# Patient Record
Sex: Male | Born: 1965
Health system: Southern US, Community
[De-identification: ages and names within clinical notes are randomized; demographics above are authoritative.]

## PROBLEM LIST (undated history)

## (undated) DIAGNOSIS — I10 Essential (primary) hypertension: Secondary | ICD-10-CM

## (undated) DIAGNOSIS — R7303 Prediabetes: Secondary | ICD-10-CM

## (undated) DIAGNOSIS — E785 Hyperlipidemia, unspecified: Secondary | ICD-10-CM

## (undated) DIAGNOSIS — E119 Type 2 diabetes mellitus without complications: Secondary | ICD-10-CM

## (undated) DIAGNOSIS — G4730 Sleep apnea, unspecified: Secondary | ICD-10-CM

## (undated) DIAGNOSIS — R55 Syncope and collapse: Secondary | ICD-10-CM

## (undated) HISTORY — DX: Essential (primary) hypertension: I10

## (undated) HISTORY — DX: Sleep apnea, unspecified: G47.30

## (undated) HISTORY — DX: Prediabetes: R73.03

## (undated) HISTORY — DX: Hyperlipidemia, unspecified: E78.5

## (undated) HISTORY — DX: Syncope and collapse: R55

## (undated) HISTORY — PX: EYE SURGERY: SHX253

## (undated) HISTORY — DX: Type 2 diabetes mellitus without complications: E11.9

---

## 2006-09-01 ENCOUNTER — Ambulatory Visit: Payer: Self-pay | Admitting: Family Medicine

## 2006-09-01 DIAGNOSIS — G4733 Obstructive sleep apnea (adult) (pediatric): Secondary | ICD-10-CM | POA: Insufficient documentation

## 2006-09-01 DIAGNOSIS — I10 Essential (primary) hypertension: Secondary | ICD-10-CM | POA: Insufficient documentation

## 2006-09-01 LAB — CONVERTED CEMR LAB
Blood in Urine, dipstick: NEGATIVE
Ketones, urine, test strip: NEGATIVE
Nitrite: NEGATIVE
Specific Gravity, Urine: 1.01
Urobilinogen, UA: NEGATIVE
WBC Urine, dipstick: NEGATIVE

## 2006-09-08 ENCOUNTER — Ambulatory Visit: Payer: Self-pay | Admitting: Family Medicine

## 2006-09-09 ENCOUNTER — Encounter (INDEPENDENT_AMBULATORY_CARE_PROVIDER_SITE_OTHER): Payer: Self-pay | Admitting: *Deleted

## 2006-09-09 DIAGNOSIS — E119 Type 2 diabetes mellitus without complications: Secondary | ICD-10-CM

## 2006-09-09 DIAGNOSIS — E1165 Type 2 diabetes mellitus with hyperglycemia: Secondary | ICD-10-CM | POA: Insufficient documentation

## 2006-09-09 LAB — CONVERTED CEMR LAB
ALT: 31 units/L (ref 0–53)
Alkaline Phosphatase: 46 units/L (ref 39–117)
BUN: 9 mg/dL (ref 6–23)
Calcium: 8.6 mg/dL (ref 8.4–10.5)
Chloride: 109 meq/L (ref 96–112)
GFR calc Af Amer: 107 mL/min
GFR calc non Af Amer: 88 mL/min
LDL Cholesterol: 127 mg/dL — ABNORMAL HIGH (ref 0–99)
Total CHOL/HDL Ratio: 6.6

## 2006-09-15 ENCOUNTER — Ambulatory Visit: Payer: Self-pay | Admitting: Family Medicine

## 2006-12-10 ENCOUNTER — Ambulatory Visit: Payer: Self-pay | Admitting: Family Medicine

## 2006-12-10 DIAGNOSIS — E7800 Pure hypercholesterolemia, unspecified: Secondary | ICD-10-CM

## 2006-12-14 ENCOUNTER — Ambulatory Visit: Payer: Self-pay | Admitting: Family Medicine

## 2006-12-14 DIAGNOSIS — E8881 Metabolic syndrome: Secondary | ICD-10-CM

## 2006-12-14 LAB — CONVERTED CEMR LAB
Cholesterol: 177 mg/dL (ref 0–200)
GFR calc Af Amer: 107 mL/min
GFR calc non Af Amer: 88 mL/min
Glucose, Bld: 116 mg/dL — ABNORMAL HIGH (ref 70–99)
HDL: 26.6 mg/dL — ABNORMAL LOW (ref >39.0)
LDL Cholesterol: 113 mg/dL — ABNORMAL HIGH (ref 0–99)
Potassium: 3.8 meq/L (ref 3.5–5.1)
Sodium: 142 meq/L (ref 135–145)
Total CHOL/HDL Ratio: 6.7
Triglycerides: 185 mg/dL — ABNORMAL HIGH (ref 0–149)

## 2007-06-09 ENCOUNTER — Ambulatory Visit: Payer: Self-pay | Admitting: Family Medicine

## 2007-06-11 ENCOUNTER — Ambulatory Visit: Payer: Self-pay | Admitting: Family Medicine

## 2007-06-11 DIAGNOSIS — J309 Allergic rhinitis, unspecified: Secondary | ICD-10-CM | POA: Insufficient documentation

## 2007-06-13 LAB — CONVERTED CEMR LAB
BUN: 11 mg/dL (ref 6–23)
Cholesterol: 174 mg/dL (ref 0–200)
GFR calc Af Amer: 95 mL/min
GFR calc non Af Amer: 79 mL/min
LDL Cholesterol: 123 mg/dL — ABNORMAL HIGH (ref 0–99)
Potassium: 3.8 meq/L (ref 3.5–5.1)
Sodium: 138 meq/L (ref 135–145)
Total CHOL/HDL Ratio: 8.6
VLDL: 31 mg/dL (ref 0–40)

## 2007-12-08 ENCOUNTER — Ambulatory Visit: Payer: Self-pay | Admitting: Family Medicine

## 2007-12-09 LAB — CONVERTED CEMR LAB
ALT: 40 units/L (ref 0–53)
Alkaline Phosphatase: 48 units/L (ref 39–117)
Bilirubin, Direct: 0.1 mg/dL (ref 0.0–0.3)
CO2: 30 meq/L (ref 19–32)
Calcium: 8.8 mg/dL (ref 8.4–10.5)
Glucose, Bld: 120 mg/dL — ABNORMAL HIGH (ref 70–99)
HDL: 30.2 mg/dL — ABNORMAL LOW (ref >39.0)
Potassium: 3.8 meq/L (ref 3.5–5.1)
Sodium: 140 meq/L (ref 135–145)
Total Protein: 6.6 g/dL (ref 6.0–8.3)

## 2007-12-15 ENCOUNTER — Ambulatory Visit: Payer: Self-pay | Admitting: Family Medicine

## 2007-12-22 ENCOUNTER — Ambulatory Visit: Payer: Self-pay | Admitting: Family Medicine

## 2007-12-22 ENCOUNTER — Encounter (INDEPENDENT_AMBULATORY_CARE_PROVIDER_SITE_OTHER): Payer: Self-pay | Admitting: *Deleted

## 2008-03-29 ENCOUNTER — Ambulatory Visit: Payer: Self-pay | Admitting: Family Medicine

## 2008-03-30 LAB — CONVERTED CEMR LAB
Albumin: 3.8 g/dL (ref 3.5–5.2)
Cholesterol: 167 mg/dL (ref 0–200)
HDL: 26.4 mg/dL — ABNORMAL LOW (ref >39.0)
LDL Cholesterol: 119 mg/dL — ABNORMAL HIGH (ref 0–99)
Total Protein: 7.2 g/dL (ref 6.0–8.3)
Triglycerides: 106 mg/dL (ref 0–149)
VLDL: 21 mg/dL (ref 0–40)

## 2008-04-07 ENCOUNTER — Ambulatory Visit: Payer: Self-pay | Admitting: Family Medicine

## 2008-07-11 ENCOUNTER — Ambulatory Visit: Payer: Self-pay | Admitting: Family Medicine

## 2008-07-17 ENCOUNTER — Ambulatory Visit: Payer: Self-pay | Admitting: Family Medicine

## 2008-07-17 LAB — CONVERTED CEMR LAB
ALT: 25 units/L (ref 0–53)
AST: 21 units/L (ref 0–37)
BUN: 9 mg/dL (ref 6–23)
Bilirubin, Direct: 0 mg/dL (ref 0.0–0.3)
CO2: 29 meq/L (ref 19–32)
Chloride: 108 meq/L (ref 96–112)
Cholesterol: 172 mg/dL (ref 0–200)
Creatinine, Ser: 1 mg/dL (ref 0.4–1.5)
Hgb A1c MFr Bld: 5.8 % (ref 4.6–6.5)
Total Protein: 6.8 g/dL (ref 6.0–8.3)
Triglycerides: 116 mg/dL (ref 0.0–149.0)

## 2008-10-03 ENCOUNTER — Ambulatory Visit: Payer: Self-pay | Admitting: Family Medicine

## 2008-10-04 LAB — CONVERTED CEMR LAB
Cholesterol: 173 mg/dL (ref 0–200)
Hgb A1c MFr Bld: 5.7 % (ref 4.6–6.5)
LDL Cholesterol: 117 mg/dL — ABNORMAL HIGH (ref 0–99)

## 2008-10-13 ENCOUNTER — Ambulatory Visit: Payer: Self-pay | Admitting: Family Medicine

## 2009-01-03 ENCOUNTER — Ambulatory Visit: Payer: Self-pay | Admitting: Family Medicine

## 2009-01-04 LAB — CONVERTED CEMR LAB
AST: 21 units/L (ref 0–37)
Albumin: 3.9 g/dL (ref 3.5–5.2)
BUN: 10 mg/dL (ref 6–23)
Calcium: 9.1 mg/dL (ref 8.4–10.5)
Cholesterol: 181 mg/dL (ref 0–200)
Creatinine, Ser: 1.1 mg/dL (ref 0.4–1.5)
GFR calc non Af Amer: 78.02 mL/min (ref >60)
Glucose, Bld: 95 mg/dL (ref 70–99)
Potassium: 4.1 meq/L (ref 3.5–5.1)
Total Bilirubin: 1 mg/dL (ref 0.3–1.2)
Triglycerides: 124 mg/dL (ref 0.0–149.0)
VLDL: 24.8 mg/dL (ref 0.0–40.0)

## 2009-01-26 ENCOUNTER — Ambulatory Visit: Payer: Self-pay | Admitting: Family Medicine

## 2010-02-19 NOTE — Assessment & Plan Note (Signed)
Summary: CPX  CYD   Vital Signs:  Patient profile:   45 year old male Height:      71 inches Weight:      339.6 pounds BMI:     47.54 Temp:     97.8 degrees F oral Pulse rate:   80 / minute Pulse rhythm:   regular BP sitting:   130 / 70  (left arm) Cuff size:   large  Vitals Entered By: Benny Lennert CMA Duncan Dull) (January 26, 2009 11:51 AM)  History of Present Illness: The patient is here for annual wellness exam and preventative care.     HTN, BPs well controlled.  Has not been taking HCTZ.  Weight gain with recent stressors.  Father in Law passed away.  DM, well controlled Not checking CBGs  High cholesterol last OV pt refused restarting medicatiton...wished to try weight loss and getting back on track, but has gained weight instead, 12 lb . Cholesterol with worse control.   Has history of benign cyst on right side of scrotum..nml URO eval.  and [place] per pt 3 years ago.   Preventive Screening-Counseling & Management  Alcohol-Tobacco     Alcohol drinks/day: <1     Alcohol Counseling: not indicated; patient does not drink     Smoking Status: never     Tobacco Counseling: not indicated; no tobacco use  Caffeine-Diet-Exercise     Diet Comments: poor     Diet Counseling: to improve diet; diet is suboptimal     Nutrition Referrals: refuses     Does Patient Exercise: no     Exercise Counseling: to improve exercise regimen  Current Medications (verified): 1)  Hydrochlorothiazide 25 Mg  Tabs (Hydrochlorothiazide) .... Take 1 Tablet By Mouth Once A Day 2)  Flax Seed Oil 1000 Mg  Caps (Flaxseed (Linseed)) .... Take 2 Capsules By Mouth Once Daily 3)  Multivitamins   Tabs (Multiple Vitamin) .... Take 1 Tablet By Mouth Once A Day 4)  Astepro 137 Mcg/spray  Soln (Azelastine Hcl) .... 2 Sprays By Mouth Two Times A Day As Needed 5)  Xyzal 5 Mg  Tabs (Levocetirizine Dihydrochloride) .Marland Kitchen.. 1 Tab By Mouth At Bedtime As Needed 6)  Cinnamon 500 Mg Caps (Cinnamon) .... 1,000 Mg. Once  Daily 7)  Simvastatin 20 Mg Tabs (Simvastatin) .Marland Kitchen.. 1 Tab By Mouth Daily  Allergies (verified): No Known Drug Allergies  Past History:  Past medical, surgical, family and social histories (including risk factors) reviewed, and no changes noted (except as noted below).  Past Medical History: Reviewed history from 09/01/2006 and no changes required. Hypertension  Past Surgical History: Reviewed history from 09/01/2006 and no changes required. Denies surgical history 1996 endoscopy: nml  Family History: Reviewed history from 09/01/2006 and no changes required. adopted unclear family history  Social History: Reviewed history from 09/01/2006 and no changes required. Occupation: Secretary/administrator room Married Never Smoked Alcohol use-yes, beer q 2-3 months Drug use-no Regular exercise-no Diet: fast food, junk food, rare fruit and veggies, likes water  Review of Systems General:  Denies fatigue and fever. CV:  Denies chest pain or discomfort. Resp:  Denies shortness of breath. GI:  Denies abdominal pain, bloody stools, and constipation. GU:  Denies dysuria. Derm:  Denies rash. Psych:  Denies anxiety and depression.  Physical Exam  General:  obese appearing male in NAd Ears:  External ear exam shows no significant lesions or deformities.  Otoscopic examination reveals clear canals, tympanic membranes are intact bilaterally without bulging, retraction, inflammation  or discharge. Hearing is grossly normal bilaterally. Nose:  External nasal examination shows no deformity or inflammation. Nasal mucosa are pink and moist without lesions or exudates. Mouth:  Oral mucosa and oropharynx without lesions or exudates.  Teeth in moderate repair. MMM Neck:  no carotid bruit or thyromegaly no cervical or supraclavicular lymphadenopathy  Lungs:  Normal respiratory effort, chest expands symmetrically. Lungs are clear to auscultation, no crackles or wheezes. Heart:  Normal rate and regular  rhythm. S1 and S2 normal without gallop, murmur, click, rub or other extra sounds. Abdomen:  Bowel sounds positive,abdomen soft and non-tender without masses, organomegaly or hernias noted. Genitalia:  Testes bilaterally descended without nodularity, tenderness or masses. No scrotal masses or lesions. No penis lesions or urethral discharge. Pulses:  R and L posterior tibial pulses are full and equal bilaterally  Extremities:  trace left pedal edema and trace right pedal edema.   Skin:  Intact without suspicious lesions or rashes Psych:  Cognition and judgment appear intact. Alert and cooperative with normal attention span and concentration. No apparent delusions, illusions, hallucinations   Impression & Recommendations:  Problem # 1:  Preventive Health Care (ICD-V70.0) Reviewed preventive care protocols, scheduled due services, and updated immunizations. Encouraged exercise, weight loss, healthy eating habits.   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  His updated medication list for this problem includes:    Simvastatin 20 Mg Tabs (Simvastatin) .Marland Kitchen... 1 tab by mouth daily  Labs Reviewed: SGOT: 21 (01/03/2009)   SGPT: 27 (01/03/2009)  Lipid Goals: Chol Goal: 200 (12/14/2006)   HDL Goal: 40 (12/14/2006)   LDL Goal: 100 (12/22/2007)   TG Goal: 150 (12/14/2006)  Prior 10 Yr Risk Heart Disease: 14 % (07/17/2008)   HDL:28.40 (01/03/2009), 30.20 (10/03/2008)  LDL:128 (01/03/2009), 117 (10/03/2008)  Chol:181 (01/03/2009), 173 (10/03/2008)  Trig:124.0 (01/03/2009), 129.0 (10/03/2008)  Problem # 3:  DIABETES MELLITUS, CONTROLLED (ICD-250.00)  Continue current medication.   Labs Reviewed: Creat: 1.1 (01/03/2009)    Reviewed HgBA1c results: 5.8 (01/03/2009)  5.7 (10/03/2008)  Problem # 4:  HYPERTENSION (ICD-401.9) Well controlled.  Continue current medication.  His updated  medication list for this problem includes:    Hydrochlorothiazide 25 Mg Tabs (Hydrochlorothiazide) .Marland Kitchen... Take 1 tablet by mouth once a day  BP today: 130/70 Prior BP: 110/82 (10/13/2008)  Prior 10 Yr Risk Heart Disease: 14 % (07/17/2008)  Labs Reviewed: K+: 4.1 (01/03/2009) Creat: : 1.1 (01/03/2009)   Chol: 181 (01/03/2009)   HDL: 28.40 (01/03/2009)   LDL: 128 (01/03/2009)   TG: 124.0 (01/03/2009)  Complete Medication List: 1)  Hydrochlorothiazide 25 Mg Tabs (Hydrochlorothiazide) .... Take 1 tablet by mouth once a day 2)  Flax Seed Oil 1000 Mg Caps (Flaxseed (linseed)) .... Take 2 capsules by mouth once daily 3)  Multivitamins Tabs (Multiple vitamin) .... Take 1 tablet by mouth once a day 4)  Astepro 137 Mcg/spray Soln (Azelastine hcl) .... 2 sprays by mouth two times a day as needed 5)  Xyzal 5 Mg Tabs (Levocetirizine dihydrochloride) .Marland Kitchen.. 1 tab by mouth at bedtime as needed 6)  Cinnamon 500 Mg Caps (Cinnamon) .... 1,000 mg. once daily 7)  Simvastatin 20 Mg Tabs (Simvastatin) .Marland Kitchen.. 1 tab by mouth daily  Patient Instructions: 1)  Hepatic Panel prior to visit ICD-9:  270.0 2)  Lipid panel prior to visit ICD-9 :  3)  HgBA1c prior to visit  ICD-9: 250.00 4)  Urine Microalbumin prior  to visit ICD-9 :  5)  Please schedule a follow-up appointment in 3 months  DM, chol.  Prescriptions: SIMVASTATIN 20 MG TABS (SIMVASTATIN) 1 tab by mouth daily  #30 x 11   Entered and Authorized by:   Kerby Nora MD   Signed by:   Kerby Nora MD on 01/26/2009   Method used:   Electronically to        [facility]  [address] (retail)       [address]       [place], [place]  [address]       Ph: [phone] or [phone]       Fax: [phone]   RxID:   [id number]     Appended Document: CPX  CYD Correction..patient open to startting low dose simvastain for chol...recheck in 3 months.

## 2013-02-08 DIAGNOSIS — H18839 Recurrent erosion of cornea, unspecified eye: Secondary | ICD-10-CM | POA: Insufficient documentation

## 2014-11-09 ENCOUNTER — Ambulatory Visit (INDEPENDENT_AMBULATORY_CARE_PROVIDER_SITE_OTHER): Payer: 59 | Admitting: Primary Care

## 2014-11-09 ENCOUNTER — Encounter: Payer: Self-pay | Admitting: Primary Care

## 2014-11-09 VITALS — BP 152/102 | HR 91 | Temp 98.1°F | Ht 73.5 in | Wt 363.1 lb

## 2014-11-09 DIAGNOSIS — R202 Paresthesia of skin: Secondary | ICD-10-CM

## 2014-11-09 DIAGNOSIS — R002 Palpitations: Secondary | ICD-10-CM | POA: Diagnosis not present

## 2014-11-09 DIAGNOSIS — I10 Essential (primary) hypertension: Secondary | ICD-10-CM

## 2014-11-09 DIAGNOSIS — G4733 Obstructive sleep apnea (adult) (pediatric): Secondary | ICD-10-CM

## 2014-11-09 DIAGNOSIS — R7303 Prediabetes: Secondary | ICD-10-CM

## 2014-11-09 LAB — BASIC METABOLIC PANEL
BUN: 13 mg/dL (ref 6–23)
CALCIUM: 9.5 mg/dL (ref 8.4–10.5)
CO2: 29 mEq/L (ref 19–32)
CREATININE: 0.95 mg/dL (ref 0.40–1.50)
Chloride: 104 mEq/L (ref 96–112)
GFR: 89.99 mL/min (ref >60.00)
Glucose, Bld: 110 mg/dL — ABNORMAL HIGH (ref 70–99)
Potassium: 4.5 mEq/L (ref 3.5–5.1)
Sodium: 139 mEq/L (ref 135–145)

## 2014-11-09 LAB — HEMOGLOBIN A1C: HEMOGLOBIN A1C: 6.5 % (ref 4.6–6.5)

## 2014-11-09 MED ORDER — LISINOPRIL 10 MG PO TABS: 10.0000 mg | ORAL_TABLET | Freq: Every day | ORAL | Status: DC

## 2014-11-09 NOTE — Assessment & Plan Note (Signed)
Endorses history of, no follow up in 4+ years. Numbness/tingling to fingers for past week. Poor diet, obese, no exercise, sedentary. Will obtain A1C today given history and symptoms. Initiating on ACE for hypertension today.

## 2014-11-09 NOTE — Assessment & Plan Note (Signed)
Diagnosed 15+ years ago and uses CPAP at home.

## 2014-11-09 NOTE — Assessment & Plan Note (Signed)
Poor diet, sedentary lifestyle. Discussed the importance of improving his diet by cutting out sodas, sweet tea, fried foods.

## 2014-11-09 NOTE — Patient Instructions (Addendum)
Your ECG (picture of your heat) does not show any abnormality.  Start Lisinopril 10 mg tablets for high blood pressure. Take 1 tablet by mouth daily.  Check your blood pressure daily, around the same time of day, for the next 2 weeks.   Ensure that you have rested for 30 minutes prior to checking your blood pressure. Record your readings and bring them to your next visit.  Complete lab work prior to leaving today. I will notify you of your results.  Follow up in 2 weeks for re-evaluation of blood pressure and headaches.  It was a pleasure to meet you today! Please don't hesitate to call me with any questions. Welcome to [facility]!

## 2014-11-09 NOTE — Assessment & Plan Note (Signed)
Endorses history of elevated readings in the past, doesn't ever remember being medicated. Elevated readings at home of 150-160/100's with headaches, dizziness, chest discomfort. Elevated in clinic today. Start Lisinopril 10 mg tablets daily.  BMP today.

## 2014-11-09 NOTE — Progress Notes (Signed)
Subjective:    Patient ID: Antonio Horton, male    DOB: [birth year], 49 y.o.   MRN: [id number]  HPI  Mr. States is a 49 year old male who presents today to establish care and discuss the problems mentioned below. Will review old records.  1) Essential Hypertension: History of elevated blood pressure readings for years, doesn't remember ever being on medications in the past. Over the past 1-2 weeks he's experienced headaches, fatigue, numbness/tingling to fingers, intermittent dizziness. He's checked his BP at home and has been getting readings of 150-160/100's on average. His BP is elevated today in the clinic at 152/102. He's also reported palpitations and chest discomfort to his left chest intermittently for 1 week. Denies nausea, radiation of pain.  2) Borderline Diabetes: History of in the past. He has not followed up with this in over 4 years. He's had some intermittent numbness/tingling to his fingers for the past week. He endorses a poor diet and has been obese for numerous years.  His diet currently consists of: Breakfast: [employer], crackers Lunch: Fast food Dinner: Meat, veggies, occasionally fries foods, bread, potatoes, rice. Snack: Trail mix, crackers Beverages: [employer], sweet water. Limited water consumption  Exercise: Does not routinely exercise.  3) OSA: Diagnosed 15+ years ago. He wears a CPAP at home.   Review of Systems  Constitutional: Negative for unexpected weight change.  HENT: Negative for rhinorrhea.   Respiratory: Negative for cough and shortness of breath.   Cardiovascular: Negative for chest pain.  Gastrointestinal: Negative for diarrhea and constipation.  Genitourinary: Negative for difficulty urinating.  Musculoskeletal: Negative for myalgias and arthralgias.  Skin: Negative for rash.  Allergic/Immunologic: Positive for environmental allergies.  Neurological: Positive for dizziness, numbness and headaches.  Psychiatric/Behavioral:   Denies concerns for anxiety and depression       Past Medical History  Diagnosis Date  . Hypertension   . Borderline diabetes     Social History   Social History  . Marital Status: Married    Spouse Name: N/A  . Number of Children: N/A  . Years of Education: N/A   Occupational History  . Not on file.   Social History Main Topics  . Smoking status: Never Smoker   . Smokeless tobacco: Not on file  . Alcohol Use: 0.0 oz/week    0 Standard drinks or equivalent per week     Comment: socially  . Drug Use: Not on file  . Sexual Activity: Not on file   Other Topics Concern  . Not on file   Social History Narrative   Married.   2 children.   Works as a Administrator, sports.   Enjoys reading.     Past Surgical History  Procedure Laterality Date  . Eye surgery      Corneal dystrophies    Family History  Problem Relation Age of Onset  . Other      adopted    No Known Allergies  No current outpatient prescriptions on file prior to visit.   No current facility-administered medications on file prior to visit.    BP 152/102 mmHg  Pulse 91  Temp(Src) 98.1 F (36.7 C) (Oral)  Ht 6' 1.5" (1.867 m)  Wt 363 lb 1.9 oz (164.71 kg)  BMI 47.25 kg/m2  SpO2 94%    Objective:   Physical Exam  Constitutional: He is oriented to person, place, and time. He appears well-nourished.  HENT:  Head: Normocephalic.  Neck: Neck supple.  Cardiovascular: Normal rate and  regular rhythm.   Pulmonary/Chest: Effort normal and breath sounds normal.  Neurological: He is alert and oriented to person, place, and time.  Skin: Skin is warm and dry.  Psychiatric: He has a normal mood and affect.          Assessment & Plan:  ECG: NSR rate of 74. No ST elevation or depression, PAC's, PVC's. T-wave inversion to V2, will continue to monitor this. Overall unremarkable ECG.

## 2014-11-10 ENCOUNTER — Other Ambulatory Visit: Payer: Self-pay | Admitting: Primary Care

## 2014-11-10 DIAGNOSIS — E119 Type 2 diabetes mellitus without complications: Secondary | ICD-10-CM

## 2014-11-10 MED ORDER — METFORMIN HCL 500 MG PO TABS: 500.0000 mg | ORAL_TABLET | Freq: Two times a day (BID) | ORAL | Status: DC

## 2014-11-23 ENCOUNTER — Ambulatory Visit (INDEPENDENT_AMBULATORY_CARE_PROVIDER_SITE_OTHER): Payer: 59 | Admitting: Primary Care

## 2014-11-23 ENCOUNTER — Encounter: Payer: Self-pay | Admitting: Primary Care

## 2014-11-23 VITALS — BP 118/76 | HR 76 | Temp 97.6°F | Ht 76.0 in | Wt 361.0 lb

## 2014-11-23 DIAGNOSIS — I10 Essential (primary) hypertension: Secondary | ICD-10-CM

## 2014-11-23 DIAGNOSIS — E119 Type 2 diabetes mellitus without complications: Secondary | ICD-10-CM | POA: Diagnosis not present

## 2014-11-23 NOTE — Progress Notes (Signed)
   Subjective:    Patient ID: Antonio Horton, male    DOB: April 05, 1965, 49 y.o.   MRN: [id number]  HPI  Antonio Horton is a 49 year old male who presents today for follow up of hypertension. He was evaluated 2 weeks ago and noted to have high blood pressure. He had elevated readings at home of 150-160/100's with symptoms of headache, dizziness, chest discomfort. He was initiated on Lisinopril 10 mg last visit.  Since his last visit his blood pressure is stable. He's been checking his BP at home and has been getting similar readings. Overall he's feeling improved with symptoms. Denies headaches, dizziness, chest discomfort, cough.   BP Readings from Last 3 Encounters:  11/23/14 118/76  11/09/14 152/102  01/26/09 130/70     Review of Systems  Respiratory: Negative for shortness of breath.   Cardiovascular: Negative for chest pain.  Neurological: Negative for dizziness, numbness and headaches.       Past Medical History  Diagnosis Date  . Hypertension   . Borderline diabetes     Social History   Social History  . Marital Status: Married    Spouse Name: N/A  . Number of Children: N/A  . Years of Education: N/A   Occupational History  . Not on file.   Social History Main Topics  . Smoking status: Never Smoker   . Smokeless tobacco: Not on file  . Alcohol Use: 0.0 oz/week    0 Standard drinks or equivalent per week     Comment: socially  . Drug Use: Not on file  . Sexual Activity: Not on file   Other Topics Concern  . Not on file   Social History Narrative   Married.   2 children.   Works as a Administrator, sports.   Enjoys reading.     Past Surgical History  Procedure Laterality Date  . Eye surgery      Corneal dystrophies    Family History  Problem Relation Age of Onset  . Other      adopted    No Known Allergies  Current Outpatient Prescriptions on File Prior to Visit  Medication Sig Dispense Refill  . lisinopril (PRINIVIL,ZESTRIL) 10 MG tablet  Take 1 tablet (10 mg total) by mouth daily. 90 tablet 0  . metFORMIN (GLUCOPHAGE) 500 MG tablet Take 1 tablet (500 mg total) by mouth 2 (two) times daily with a meal. 60 tablet 3   No current facility-administered medications on file prior to visit.    BP 118/76 mmHg  Pulse 76  Temp(Src) 97.6 F (36.4 C) (Oral)  Ht 6\' 4"  (1.93 m)  Wt 361 lb (163.749 kg)  BMI 43.96 kg/m2  SpO2 97%    Objective:   Physical Exam  Constitutional: He appears well-nourished.  Cardiovascular: Normal rate and regular rhythm.   Pulmonary/Chest: Effort normal and breath sounds normal.  Skin: Skin is warm and dry.          Assessment & Plan:

## 2014-11-23 NOTE — Patient Instructions (Signed)
Please schedule a physical with me in 3 months. You will also schedule a lab only appointment one week prior which will need to be after January 20th. We will discuss your lab results during your physical.  Continue taking Lisinopril for high blood pressure.   Don't hesitate to call me if you have any additional questions.  It was a pleasure to see you today!  Hypertension Hypertension, commonly called high blood pressure, is when the force of blood pumping through your arteries is too strong. Your arteries are the blood vessels that carry blood from your heart throughout your body. A blood pressure reading consists of a higher number over a lower number, such as 110/72. The higher number (systolic) is the pressure inside your arteries when your heart pumps. The lower number (diastolic) is the pressure inside your arteries when your heart relaxes. Ideally you want your blood pressure below 120/80. Hypertension forces your heart to work harder to pump blood. Your arteries may become narrow or stiff. Having untreated or uncontrolled hypertension can cause heart attack, stroke, kidney disease, and other problems. RISK FACTORS Some risk factors for high blood pressure are controllable. Others are not.  Risk factors you cannot control include:   Race. You may be at higher risk if you are African American.  Age. Risk increases with age.  Gender. Men are at higher risk than women before age 73 years. After age 2, women are at higher risk than men. Risk factors you can control include:  Not getting enough exercise or physical activity.  Being overweight.  Getting too much fat, sugar, calories, or salt in your diet.  Drinking too much alcohol. SIGNS AND SYMPTOMS Hypertension does not usually cause signs or symptoms. Extremely high blood pressure (hypertensive crisis) may cause headache, anxiety, shortness of breath, and nosebleed. DIAGNOSIS To check if you have hypertension, your health care  provider will measure your blood pressure while you are seated, with your arm held at the level of your heart. It should be measured at least twice using the same arm. Certain conditions can cause a difference in blood pressure between your right and left arms. A blood pressure reading that is higher than normal on one occasion does not mean that you need treatment. If it is not clear whether you have high blood pressure, you may be asked to return on a different day to have your blood pressure checked again. Or, you may be asked to monitor your blood pressure at home for 1 or more weeks. TREATMENT Treating high blood pressure includes making lifestyle changes and possibly taking medicine. Living a healthy lifestyle can help lower high blood pressure. You may need to change some of your habits. Lifestyle changes may include:  Following the DASH diet. This diet is high in fruits, vegetables, and whole grains. It is low in salt, red meat, and added sugars.  Keep your sodium intake below 2,300 mg per day.  Getting at least 30-45 minutes of aerobic exercise at least 4 times per week.  Losing weight if necessary.  Not smoking.  Limiting alcoholic beverages.  Learning ways to reduce stress. Your health care provider may prescribe medicine if lifestyle changes are not enough to get your blood pressure under control, and if one of the following is true:  You are 55-78 years of age and your systolic blood pressure is above 140.  You are 25 years of age or older, and your systolic blood pressure is above 150.  Your diastolic blood  pressure is above 90.  You have diabetes, and your systolic blood pressure is over 967 or your diastolic blood pressure is over 90.  You have kidney disease and your blood pressure is above 140/90.  You have heart disease and your blood pressure is above 140/90. Your personal target blood pressure may vary depending on your medical conditions, your age, and other  factors. HOME CARE INSTRUCTIONS  Have your blood pressure rechecked as directed by your health care provider.   Take medicines only as directed by your health care provider. Follow the directions carefully. Blood pressure medicines must be taken as prescribed. The medicine does not work as well when you skip doses. Skipping doses also puts you at risk for problems.  Do not smoke.   Monitor your blood pressure at home as directed by your health care provider. SEEK MEDICAL CARE IF:   You think you are having a reaction to medicines taken.  You have recurrent headaches or feel dizzy.  You have swelling in your ankles.  You have trouble with your vision. SEEK IMMEDIATE MEDICAL CARE IF:  You develop a severe headache or confusion.  You have unusual weakness, numbness, or feel faint.  You have severe chest or abdominal pain.  You vomit repeatedly.  You have trouble breathing. MAKE SURE YOU:   Understand these instructions.  Will watch your condition.  Will get help right away if you are not doing well or get worse.   This information is not intended to replace advice given to you by your health care provider. Make sure you discuss any questions you have with your health care provider.   Document Released: 01/06/2005 Document Revised: 05/23/2014 Document Reviewed: 10/29/2012 Elsevier Interactive Patient Education [employer].

## 2014-11-23 NOTE — Progress Notes (Signed)
Pre visit review using our clinic review tool, if applicable. No additional management support is needed unless otherwise documented below in the visit note. 

## 2014-11-23 NOTE — Assessment & Plan Note (Signed)
A1C of 6.5 in October 2016. Metformin 500 mg BID initiated. Discussed importance of healthy diet and exercise. Repeat A1C in 3 months at physical.

## 2014-11-23 NOTE — Assessment & Plan Note (Signed)
Improved since initiation of Lisinopril 10 mg. Continue current regimen. Discussed importance of healthy diet and exercise. BMP last visit WNL. Follow up in 3 months.

## 2015-02-16 ENCOUNTER — Other Ambulatory Visit: Payer: Self-pay | Admitting: Primary Care

## 2015-02-16 DIAGNOSIS — I10 Essential (primary) hypertension: Secondary | ICD-10-CM

## 2015-02-16 NOTE — Telephone Encounter (Signed)
Electronically refill request for   lisinopril (PRINIVIL,ZESTRIL) 10 MG tablet   Take 1 tablet (10 mg total) by mouth daily.  Dispense: 90 tablet   Refills: 0     Last prescribed on 11/09/2014. Last seen on 11/23/2014. CPE on 03/07/2015.

## 2015-02-22 ENCOUNTER — Other Ambulatory Visit: Payer: Self-pay | Admitting: Primary Care

## 2015-02-22 DIAGNOSIS — E119 Type 2 diabetes mellitus without complications: Secondary | ICD-10-CM

## 2015-02-22 DIAGNOSIS — I10 Essential (primary) hypertension: Secondary | ICD-10-CM

## 2015-03-02 ENCOUNTER — Other Ambulatory Visit (INDEPENDENT_AMBULATORY_CARE_PROVIDER_SITE_OTHER): Payer: 59

## 2015-03-02 DIAGNOSIS — I10 Essential (primary) hypertension: Secondary | ICD-10-CM | POA: Diagnosis not present

## 2015-03-02 DIAGNOSIS — E119 Type 2 diabetes mellitus without complications: Secondary | ICD-10-CM

## 2015-03-02 LAB — MICROALBUMIN / CREATININE URINE RATIO
Creatinine,U: 141.3 mg/dL
MICROALB/CREAT RATIO: 0 mg/g (ref 0.0–30.0)
Microalb, Ur: <0.1 mg/dL (ref 0.0–1.9)

## 2015-03-02 LAB — COMPREHENSIVE METABOLIC PANEL
ALT: 19 U/L (ref 0–53)
AST: 17 U/L (ref 0–37)
Albumin: 3.9 g/dL (ref 3.5–5.2)
Alkaline Phosphatase: 55 U/L (ref 39–117)
BUN: 11 mg/dL (ref 6–23)
CALCIUM: 9.4 mg/dL (ref 8.4–10.5)
CHLORIDE: 104 meq/L (ref 96–112)
CO2: 31 meq/L (ref 19–32)
CREATININE: 1.05 mg/dL (ref 0.40–1.50)
GFR: 80.07 mL/min (ref >60.00)
GLUCOSE: 137 mg/dL — AB (ref 70–99)
Potassium: 4.6 mEq/L (ref 3.5–5.1)
SODIUM: 139 meq/L (ref 135–145)
Total Bilirubin: 0.6 mg/dL (ref 0.2–1.2)
Total Protein: 7.2 g/dL (ref 6.0–8.3)

## 2015-03-02 LAB — HEMOGLOBIN A1C: Hgb A1c MFr Bld: 6.6 % — ABNORMAL HIGH (ref 4.6–6.5)

## 2015-03-02 LAB — LIPID PANEL
CHOL/HDL RATIO: 6
Cholesterol: 180 mg/dL (ref 0–200)
HDL: 31.4 mg/dL — AB (ref >39.00)
LDL Cholesterol: 125 mg/dL — ABNORMAL HIGH (ref 0–99)
NonHDL: 148.16
TRIGLYCERIDES: 118 mg/dL (ref 0.0–149.0)
VLDL: 23.6 mg/dL (ref 0.0–40.0)

## 2015-03-07 ENCOUNTER — Telehealth: Payer: Self-pay | Admitting: Primary Care

## 2015-03-07 ENCOUNTER — Encounter: Payer: Self-pay | Admitting: Primary Care

## 2015-03-07 ENCOUNTER — Ambulatory Visit
Admission: RE | Admit: 2015-03-07 | Discharge: 2015-03-07 | Disposition: A | Discharge status: Home / Self Care | Payer: 59 | Source: Ambulatory Visit | Attending: Internal Medicine | Admitting: Internal Medicine

## 2015-03-07 ENCOUNTER — Ambulatory Visit (INDEPENDENT_AMBULATORY_CARE_PROVIDER_SITE_OTHER): Payer: 59 | Admitting: Primary Care

## 2015-03-07 ENCOUNTER — Ambulatory Visit
Admission: RE | Admit: 2015-03-07 | Discharge: 2015-03-07 | Disposition: A | Discharge status: Home / Self Care | Payer: 59 | Source: Ambulatory Visit | Attending: Primary Care | Admitting: Primary Care

## 2015-03-07 VITALS — BP 132/90 | HR 77 | Temp 98.0°F | Ht 73.0 in | Wt 359.5 lb

## 2015-03-07 DIAGNOSIS — M5442 Lumbago with sciatica, left side: Secondary | ICD-10-CM | POA: Insufficient documentation

## 2015-03-07 DIAGNOSIS — M5441 Lumbago with sciatica, right side: Secondary | ICD-10-CM

## 2015-03-07 DIAGNOSIS — M545 Low back pain, unspecified: Secondary | ICD-10-CM | POA: Insufficient documentation

## 2015-03-07 DIAGNOSIS — M5136 Other intervertebral disc degeneration, lumbar region: Secondary | ICD-10-CM | POA: Diagnosis not present

## 2015-03-07 DIAGNOSIS — Z0001 Encounter for general adult medical examination with abnormal findings: Secondary | ICD-10-CM

## 2015-03-07 DIAGNOSIS — Z0000 Encounter for general adult medical examination without abnormal findings: Secondary | ICD-10-CM | POA: Insufficient documentation

## 2015-03-07 DIAGNOSIS — E119 Type 2 diabetes mellitus without complications: Secondary | ICD-10-CM

## 2015-03-07 DIAGNOSIS — E7800 Pure hypercholesterolemia, unspecified: Secondary | ICD-10-CM

## 2015-03-07 DIAGNOSIS — I10 Essential (primary) hypertension: Secondary | ICD-10-CM | POA: Diagnosis not present

## 2015-03-07 NOTE — Telephone Encounter (Signed)
Will you please call Antonio Horton and notify him that prior to his appointment with neurosurgery, he will need to complete xrays of his back in our clinic. I have placed the orders for xray, he should try to come in sometime this week if possible. Open late on Wednesday and Thursday.

## 2015-03-07 NOTE — Assessment & Plan Note (Signed)
Slightly above goal today. Home readings of 130/70's per patient. He does feel nervous today. Will continue to monitor.

## 2015-03-07 NOTE — Assessment & Plan Note (Signed)
Tdap UTD. Declines flu and pneumonia. Exam unremarkable. Labs with diabetes and hyperlipidemia. Discussed the importance of a healthy diet and regular exercise in order for weight loss and to reduce risk of other medical diseases.  Follow up in 1 year for repeat physical.

## 2015-03-07 NOTE — Progress Notes (Signed)
Pre visit review using our clinic review tool, if applicable. No additional management support is needed unless otherwise documented below in the visit note. 

## 2015-03-07 NOTE — Telephone Encounter (Addendum)
Called patient and he stated that he already done his x-rays this morning at [facility].

## 2015-03-07 NOTE — Patient Instructions (Addendum)
Start taking your Metformin twice daily everyday. Your diabetes is getting worse, so it's crucial to take this medication as prescribed.  It is important that you improve your diet. Please limit carbohydrates in the form of white bread, rice, pasta, fast food, soft drinks, etc. Increase your consumption of fresh fruits and vegetables.  You need to consume about 2 liters of water daily.  Start exercising. You should be getting 1 hour of moderate intensity exercise 5 days weekly.  Check your blood pressure daily, around the same time of day, for the next 2 weeks.   Ensure that you have rested for 30 minutes prior to checking your blood pressure. Record your readings and call me in 2 weeks with the numbers.  Stop by the front desk and speak with either Rosaria Ferries or Ebony Hail regarding your referral to Neurology.  Schedule a lab only appointment in 3 months for re-check of the diabetes.  Follow up in 6 months for re-evaluation.  It was a pleasure to see you today!

## 2015-03-07 NOTE — Assessment & Plan Note (Signed)
Slightly above goal with LDL of 125. Will allow him to work on is diet and start exercising. Re-check in 3 months, if no improvement then will consider statin.

## 2015-03-07 NOTE — Assessment & Plan Note (Addendum)
A1C of 6.6, worse from last visit. He's not taking his Metformin daily, will take 3 times weekly once daily if he remembers. Stressed the importance of medication compliance.  Declines pneumonia vaccination today. Urine microalbumin negative. Managed on ACE for HTN. Discussed the importance of a healthy diet and regular exercise in order for weight loss and to reduce risk of other medical diseases.  Will repeat A1C in 3 months.

## 2015-03-07 NOTE — Assessment & Plan Note (Signed)
Present for years since falling off deck. Pain worse and now with movement to thoracic spine. He would like evaluation by neurosurgery and has already called.  DG lumbar spine today with degenerative changes, otherwise no abnormality.  Referral placed per patient request. Also discussed that weight loss was important to improve back pain.

## 2015-03-07 NOTE — Progress Notes (Signed)
Subjective:    Patient ID: Antonio Horton, male    DOB: 1965/11/27, 50 y.o.   MRN: [id number]  HPI  Antonio Horton is a 50 year old male who presents today for complete physical.  Immunizations: -Tetanus: Completed in February 2010 -Influenza: Declines -Pneumonia: Declines   Diet: He endorses a healthy diet. Breakfast: Bagel, crackers Lunch: Fast food, trying to make better choices out. Dinner: Home cooked meals (chicken, vegetables, tacos) Snacks: None Desserts: None Beverages: Mountain Dew (1 20 ounce bottle daily), sweet tea, water  Exercise: He is not currently exercising Eye exam: Completed 1 week ago. New prescription. Dental exam: Completed 1 year ago  1) Back Pain: Chronic for 2 years since accidentally falling off of his deck 2 years ago. Located to his lower back and is now radiating to upper back. He's called a neurosurgery office who requires a referral for evaluation. His pain is worse with movement after resting for a while, especially in the morning. Denies radiculopathy. He's not had any imaging completed. He is obese.   Review of Systems  Constitutional: Negative for unexpected weight change.  HENT: Negative for rhinorrhea.   Respiratory: Negative for cough and shortness of breath.   Cardiovascular: Negative for chest pain.  Gastrointestinal: Negative for diarrhea and constipation.  Genitourinary: Negative for difficulty urinating.  Musculoskeletal: Negative for myalgias and arthralgias.  Skin: Negative for rash.  Allergic/Immunologic: Positive for environmental allergies.  Neurological: Negative for dizziness, numbness and headaches.  Psychiatric/Behavioral:       Denies concerns for anxiety or depression       Past Medical History  Diagnosis Date  . Hypertension   . Borderline diabetes     Social History   Social History  . Marital Status: Married    Spouse Name: N/A  . Number of Children: N/A  . Years of Education: N/A   Occupational  History  . Not on file.   Social History Main Topics  . Smoking status: Never Smoker   . Smokeless tobacco: Not on file  . Alcohol Use: 0.0 oz/week    0 Standard drinks or equivalent per week     Comment: socially  . Drug Use: No  . Sexual Activity: Not on file   Other Topics Concern  . Not on file   Social History Narrative   Married.   2 children.   Works as a Administrator, sports.   Enjoys reading.     Past Surgical History  Procedure Laterality Date  . Eye surgery      Corneal dystrophies    Family History  Problem Relation Age of Onset  . Other      adopted    No Known Allergies  Current Outpatient Prescriptions on File Prior to Visit  Medication Sig Dispense Refill  . lisinopril (PRINIVIL,ZESTRIL) 10 MG tablet TAKE 1 TABLET (10 MG TOTAL) BY MOUTH DAILY. 90 tablet 2  . metFORMIN (GLUCOPHAGE) 500 MG tablet Take 1 tablet (500 mg total) by mouth 2 (two) times daily with a meal. 60 tablet 3   No current facility-administered medications on file prior to visit.    BP 132/90 mmHg  Pulse 77  Temp(Src) 98 F (36.7 C) (Oral)  Ht 6\' 1"  (1.854 m)  Wt 359 lb 8 oz (163.068 kg)  BMI 47.44 kg/m2  SpO2 96%    Objective:   Physical Exam  Constitutional: He is oriented to person, place, and time. He appears well-nourished.  HENT:  Right Ear: Tympanic membrane and ear  canal normal.  Left Ear: Tympanic membrane and ear canal normal.  Nose: Nose normal. Right sinus exhibits no maxillary sinus tenderness and no frontal sinus tenderness. Left sinus exhibits no maxillary sinus tenderness and no frontal sinus tenderness.  Mouth/Throat: Oropharynx is clear and moist.  Eyes: Conjunctivae and EOM are normal. Pupils are equal, round, and reactive to light.  Neck: Neck supple. No thyromegaly present.  Cardiovascular: Normal rate, regular rhythm and normal heart sounds.   Pulmonary/Chest: Effort normal and breath sounds normal. He has no wheezes. He has no rales.  Abdominal:  Soft. Bowel sounds are normal. There is no tenderness.  Musculoskeletal: Normal range of motion.  Negative straight leg raise.   Neurological: He is alert and oriented to person, place, and time. He has normal reflexes. No cranial nerve deficit.  Skin: Skin is warm and dry.  Psychiatric: He has a normal mood and affect.          Assessment & Plan:

## 2015-03-21 ENCOUNTER — Telehealth: Payer: Self-pay | Admitting: Primary Care

## 2015-03-21 NOTE — Telephone Encounter (Signed)
Tried to call patient this morning and afternoon. Could not leave message due to no voicemail set up.

## 2015-03-21 NOTE — Telephone Encounter (Signed)
Will you check on Antonio Horton BP? It was slightly elevated during his physical.

## 2015-04-05 ENCOUNTER — Ambulatory Visit: Payer: 59

## 2015-04-10 ENCOUNTER — Ambulatory Visit: Payer: 59 | Attending: Neurosurgery | Admitting: Physical Therapy

## 2015-04-10 DIAGNOSIS — R262 Difficulty in walking, not elsewhere classified: Secondary | ICD-10-CM

## 2015-04-10 DIAGNOSIS — M545 Low back pain: Secondary | ICD-10-CM | POA: Insufficient documentation

## 2015-04-10 DIAGNOSIS — M5441 Lumbago with sciatica, right side: Secondary | ICD-10-CM

## 2015-04-10 DIAGNOSIS — M5430 Sciatica, unspecified side: Secondary | ICD-10-CM | POA: Diagnosis present

## 2015-04-10 DIAGNOSIS — M5440 Lumbago with sciatica, unspecified side: Secondary | ICD-10-CM

## 2015-04-10 NOTE — Therapy (Signed)
[facility] [address]  [place], [place], [address] Phone: [phone]   Fax:  [phone]  Physical Therapy Evaluation  Patient Details  Name: Antonio Horton MRN: [id number] Date of Birth: 12-09-1965 Referring Provider: Ophelia Charter, MD  Encounter Date: 04/10/2015      PT End of Session - 04/10/15 1447    Visit Number 1   Number of Visits 16   Date for PT Re-Evaluation 06/05/15   PT Start Time 1400   PT Stop Time 1446   PT Time Calculation (min) 46 min   Activity Tolerance Patient tolerated treatment well;Patient limited by pain   Behavior During Therapy [facility] for tasks assessed/performed      Past Medical History  Diagnosis Date  . Hypertension   . Borderline diabetes     Past Surgical History  Procedure Laterality Date  . Eye surgery      Corneal dystrophies    There were no vitals filed for this visit.  Visit Diagnosis:  Right-sided low back pain with sciatica, sciatica laterality unspecified  Low back pain of thoracolumbar region with sciatica  Difficulty walking      Subjective Assessment - 04/10/15 1410    Subjective Pt reports h/o low back pain x ~2 years, starting when pt fell off his back deck. States pain has progressively gotten worse and now seems to be spreading up his back to just inferior to shoulder blades and wrapping around ribs, as well as down the sides of his legs to just above the knees but pain worst in hips.   Limitations Standing;Sitting;Walking   How long can you sit comfortably? 45 minutes   How long can you stand comfortably? 20 minutes   How long can you walk comfortably? 20-30 minutes before pain in leg causes limp   Diagnostic tests Lumbar spine x-ray 03/07/15: Degenerative spurring anteriorly throughout the lumbar spine. Degenerative facet disease diffusely. Normal alignment. No fracture. Disc spaces are maintained. SI joints are symmetric and  unremarkable.   Patient Stated Goals "To move around with less pain and pick stuff up off the ground."   Currently in Pain? Yes   Pain Score 4   Least 2/10, Avg 5/10, Worst 9/10   Pain Location Back   Pain Orientation Mid;Lower;Right   Pain Descriptors / Indicators Throbbing;Sharp   Pain Radiating Towards "tearing pain" up back to just inferior to shoulder blades and wrapping around ribs, as well as down the sides of his legs to just above the knees but pain worst in hips   Pain Onset More than a month ago   Pain Frequency Constant  varies in intensity   Aggravating Factors  Changing position from supine to sitting and in reverse, Prolonged standing, Twisting & reaching, Bending   Pain Relieving Factors Ibuprofen, Lying down flat, Walking   Effect of Pain on Daily Activities Avoids bending (unable to pick up objects from low shelf of floor)            [facility] PT Assessment - 04/10/15 1400    Assessment   Medical Diagnosis Chronic low back pain   Referring Provider Ophelia Charter, MD   Onset Date/Surgical Date --  2 years   Next MD Visit TBD   Prior Therapy none   Balance Screen   Has the patient fallen in the past 6 months Yes   How many times? 2   Has the patient had a decrease in activity level because  of a fear of falling?  No   Is the patient reluctant to leave their home because of a fear of falling?  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   Leisure Softball with dtr; Competetive shooting   Observation/Other Assessments   Focus on Therapeutic Outcomes (FOTO)  Lumbar spine - 45% (55% limitation): Predicted 60% (40% limitation)   ROM / Strength   AROM / PROM / Strength AROM   AROM   AROM Assessment Site Lumbar   Lumbar Flexion 80%  pain upon return to standing   Lumbar Extension 30%  pain into movement    Lumbar - Right Side Bend 10%  pain   Lumbar - Left Side Bend 40%  pain   Lumbar - Right Rotation 25%  pain   Lumbar - Left Rotation 50%  pain   Flexibility   Soft Tissue Assessment /Muscle Length yes   Hamstrings mod tightness on L, unable to assess R due severe LBP with passive SLR   Piriformis mildly tight bilaterally   Special Tests    Special Tests Lumbar   Lumbar Tests Straight Leg Raise;FABER test   FABER test   findings Negative   Straight Leg Raise   Findings Positive   Side  Right         Today's Treatment  TherEx SKTC stretch with towel 2x30" LTR 10x5" Abdominal bracing 10x5" TrA + Alternating Hip ABD/ER 10x3" TrA + LE marching 10x3"           PT Education - 04/10/15 1641    Education provided Yes   Education Details PT eval findings, POC, back precautions with in/out of bed and initial HEP   Person(s) Educated Patient   Methods Explanation;Demonstration;Handout   Comprehension Verbalized understanding;Returned demonstration;Need further instruction          PT Short Term Goals - 04/10/15 1645    PT SHORT TERM GOAL #1   Title Pt will be independent with initial HEP by 05/01/15   Status New           PT Long Term Goals - 04/10/15 1646    PT LONG TERM GOAL #1   Title Pt will be independent with latest HEP by 06/05/15   Status New   PT LONG TERM GOAL #2   Title Pt will demonstrate lumbar ROM at least 75% of normal by 06/05/15   Status New   PT LONG TERM GOAL #3   Title Pt will demonstrate proper body mechanics with bending and lifting from low heights to prevent exacerbation of low back pain by 06/05/15   Status New   PT LONG TERM GOAL #4   Title Pt will report ability to walk >30 minutes without limitation due to low back or LE pain by 06/05/15   Status New               Plan - 04/10/15 1653    Clinical Impression Statement Pt is a 50 y/o male who presents with low back pain with radicular pain down lateral thighs to just above  knees and up mid back to just inferior to scapula extending around to lower ribs. Pain exacerbated by transitions in/out of bed, trunk rotation or sidebending, and bending forward to pick up objects from low height. Assessment reveals significantly limited lumbar ROM in all directions due  to pain with positive SLR test on R.   Pt will benefit from skilled therapeutic intervention in order to improve on the following deficits Pain;Impaired flexibility;Decreased range of motion;Increased muscle spasms;Improper body mechanics;Postural dysfunction;Decreased strength;Difficulty walking;Decreased activity tolerance;Decreased balance   Rehab Potential Good   Clinical Impairments Affecting Rehab Potential Morbid obesity, diabetes   PT Frequency 2x / week   PT Duration 8 weeks   PT Treatment/Interventions Patient/family education;Therapeutic exercise;Manual techniques;Dry needling;Taping;Ultrasound;Moist Heat;Electrical Stimulation;Cryotherapy;Iontophoresis 4mg /ml Dexamethasone;Traction;Therapeutic activities;Functional mobility training;ADLs/Self Care Home Management;Neuromuscular re-education   PT Next Visit Plan Review initial HEP; Instruct in sciatic nerve glides; Lumbar/proximal LE flexibility and strengthening; Manual therapy, Taping & Modalities PRN for pain; Possible Mechanical traction   Consulted and Agree with Plan of Care Patient         Problem List Patient Active Problem List   Diagnosis Date Noted  . Low back pain 03/07/2015  . Preventative health care 03/07/2015  . ALLERGIC RHINITIS CAUSE UNSPECIFIED 06/11/2007  . DYSMETABOLIC SYNDROME [id number]  . PURE HYPERCHOLESTEROLEMIA 12/10/2006  . Type 2 diabetes mellitus ([place]) 09/09/2006  . OBESITY, MORBID 09/01/2006  . Obstructive sleep apnea 09/01/2006  . Essential hypertension 09/01/2006    Percival Spanish, PT, MPT 04/10/2015, 6:27 PM  [facility] [address]  [place], [place], [address] Phone: [phone]   Fax:  [phone]  Name: Antonio Horton MRN: [id number] Date of Birth: 1965-09-22

## 2015-04-17 ENCOUNTER — Ambulatory Visit: Payer: 59

## 2015-04-17 DIAGNOSIS — R262 Difficulty in walking, not elsewhere classified: Secondary | ICD-10-CM

## 2015-04-17 DIAGNOSIS — M5441 Lumbago with sciatica, right side: Secondary | ICD-10-CM

## 2015-04-17 DIAGNOSIS — M5440 Lumbago with sciatica, unspecified side: Secondary | ICD-10-CM

## 2015-04-17 NOTE — Therapy (Signed)
[facility] [address]  [place], [place], [address] Phone: [phone]   Fax:  [phone]  Physical Therapy Treatment  Patient Details  Name: Antonio Horton MRN: [id number] Date of Birth: 1965/05/14 Referring Provider: Ophelia Charter, MD  Encounter Date: 04/17/2015      PT End of Session - 04/17/15 1414    Visit Number 2   Number of Visits 16   Date for PT Re-Evaluation 06/05/15   PT Start Time [id number]   PT Stop Time 1445   PT Time Calculation (min) 41 min   Activity Tolerance Patient tolerated treatment well;Patient limited by pain   Behavior During Therapy [facility] for tasks assessed/performed      Past Medical History  Diagnosis Date  . Hypertension   . Borderline diabetes     Past Surgical History  Procedure Laterality Date  . Eye surgery      Corneal dystrophies    There were no vitals filed for this visit.  Visit Diagnosis:  Right-sided low back pain with sciatica, sciatica laterality unspecified  Low back pain of thoracolumbar region with sciatica  Difficulty walking      Subjective Assessment - 04/17/15 1408    Subjective Pt. reports 6/10 LB pain today initially.  Pt. reports pain is waking him up throughout the night frequently.     Patient Stated Goals "To move around with less pain and pick stuff up off the ground."   Currently in Pain? Yes   Pain Score 4    Pain Location Back   Pain Orientation Left   Pain Radiating Towards down both legs, and up around the ribs and side up just short of both shoulder blades.     Pain Onset More than a month ago   Pain Frequency Constant   Aggravating Factors  changing positions from supine to sitting   Pain Relieving Factors ibuprofen, lying down flat   Multiple Pain Sites No      Today's treatment:  Therex: B HS, piri, SKTC, x 30 sec each  Hooklying abdominal bracing with marching x 3 reps each side; activity terminated secondary to  LBP. Hooklying abdominal bracing 2 x 10 reps 5" LTR x 20 sec each way; R side rotation terminated secondary to pain LBP increase  Hooklying B hip abd / ER with blue TB around knees x 10 reps Bridging x 10 reps; pt. barely able to raise off table    HEP review (pt. LBP worse with LTR to R)        PT Short Term Goals - 04/17/15 1822    PT SHORT TERM GOAL #1   Title Pt will be independent with initial HEP by 05/01/15   Status On-going           PT Long Term Goals - 04/17/15 1823    PT LONG TERM GOAL #1   Title Pt will be independent with latest HEP by 06/05/15   Status On-going   PT LONG TERM GOAL #2   Title Pt will demonstrate lumbar ROM at least 75% of normal by 06/05/15   Status On-going   PT LONG TERM GOAL #3   Title Pt will demonstrate proper body mechanics with bending and lifting from low heights to prevent exacerbation of low back pain by 06/05/15   Status On-going   PT LONG TERM GOAL #4   Title Pt will report ability to walk >30 minutes without limitation due to low back or  LE pain by 06/05/15   Status On-going               Plan - 04/17/15 1432    Clinical Impression Statement Pt. with limited tolerance for lumbopelvic strengthening activity today secondary to LBP however with 4 point LBP decrease following hip / LE stretching.  flexion bias approach with all activity today; abdominal bracing with lumbopelvic activity however pt. unable to perform abdom. marching or LTR secondary to increase in LBP; pt. continue to be very guarded with worst pain on supine <> sit transition.     PT Next Visit Plan Lumbar/proximal LE flexibility and strengthening; Manual therapy, Taping & Modalities PRN for pain; Possible Mechanical traction        Problem List Patient Active Problem List   Diagnosis Date Noted  . Low back pain 03/07/2015  . Preventative health care 03/07/2015  . ALLERGIC RHINITIS CAUSE UNSPECIFIED 06/11/2007  . DYSMETABOLIC SYNDROME [id number]  . PURE  HYPERCHOLESTEROLEMIA 12/10/2006  . Type 2 diabetes mellitus ([place]) 09/09/2006  . OBESITY, MORBID 09/01/2006  . Obstructive sleep apnea 09/01/2006  . Essential hypertension 09/01/2006    Bess Harvest, PTA 04/17/2015, 6:24 PM  [facility] [address]  [place], [place], [address] Phone: [phone]   Fax:  [phone]  Name: Antonio Horton MRN: [id number] Date of Birth: [birth year]

## 2015-04-20 ENCOUNTER — Ambulatory Visit: Payer: 59 | Admitting: Physical Therapy

## 2015-04-20 DIAGNOSIS — M5441 Lumbago with sciatica, right side: Secondary | ICD-10-CM

## 2015-04-20 DIAGNOSIS — M5440 Lumbago with sciatica, unspecified side: Secondary | ICD-10-CM

## 2015-04-20 DIAGNOSIS — R262 Difficulty in walking, not elsewhere classified: Secondary | ICD-10-CM

## 2015-04-20 NOTE — Therapy (Signed)
[facility] [address]  [place], [place], [address] Phone: [phone]   Fax:  [phone]  Physical Therapy Treatment  Patient Details  Name: Antonio Horton MRN: [id number] Date of Birth: [birth year] Referring Provider: Ophelia Charter, MD  Encounter Date: 04/20/2015      PT End of Session - 04/20/15 0927    Visit Number 3   Number of Visits 16   Date for PT Re-Evaluation 06/05/15   PT Start Time 0850   PT Stop Time 0930   PT Time Calculation (min) 40 min   Activity Tolerance Patient limited by fatigue;Patient tolerated treatment well   Behavior During Therapy [facility] for tasks assessed/performed      Past Medical History  Diagnosis Date   Hypertension    Borderline diabetes     Past Surgical History  Procedure Laterality Date   Eye surgery      Corneal dystrophies    There were no vitals filed for this visit.  Visit Diagnosis:  Right-sided low back pain with sciatica, sciatica laterality unspecified  Low back pain of thoracolumbar region with sciatica  Difficulty walking      Subjective Assessment - 04/20/15 0909    Subjective Reports that he is feeling about the same.  No appreciable change in symptoms since initiating PT. Cont with Right> Left mid-lower back pain.  Stiff and painful after sittring all day at work.   Currently in Pain? Yes   Pain Score 4    Pain Location Back   Pain Orientation Right;Left      Today's treatment:  Therex: B HS, piri, SKTC, x 30 sec each  Hooklying abdominal bracing with marching x 2x10ea b Hooklying abdominal bracing 2 x 10 reps 5" P ball DKTC 2x12 LTR x 20 sec each way Bridging 2x12 reps; limited ROM  Peanut bridge with knee ext 2x12 HEP review (pt. LBP worse with LTR to R)                             PT Short Term Goals - 04/17/15 1822    PT SHORT TERM GOAL #1   Title Pt will be independent with initial HEP by  05/01/15   Status On-going           PT Long Term Goals - 04/17/15 1823    PT LONG TERM GOAL #1   Title Pt will be independent with latest HEP by 06/05/15   Status On-going   PT LONG TERM GOAL #2   Title Pt will demonstrate lumbar ROM at least 75% of normal by 06/05/15   Status On-going   PT LONG TERM GOAL #3   Title Pt will demonstrate proper body mechanics with bending and lifting from low heights to prevent exacerbation of low back pain by 06/05/15   Status On-going   PT LONG TERM GOAL #4   Title Pt will report ability to walk >30 minutes without limitation due to low back or LE pain by 06/05/15   Status On-going               Plan - 04/20/15 0911    Clinical Impression Statement Significantly limeted LE flexiblity/ mobility. This is compounded by pts size as he is limited with knees to chest by the size of his abdominal region.  Painful with majority of treatment , particularly hooklying trunk rotation and LE stretches.     Pt  will benefit from skilled therapeutic intervention in order to improve on the following deficits Pain;Impaired flexibility;Decreased range of motion;Increased muscle spasms;Improper body mechanics;Postural dysfunction;Decreased strength;Difficulty walking;Decreased activity tolerance;Decreased balance   Clinical Impairments Affecting Rehab Potential Morbid obesity, diabetes   PT Next Visit Plan Lumbar/proximal LE flexibility and strengthening; Manual therapy, Taping & Modalities PRN for pain; Possible Mechanical traction        Problem List Patient Active Problem List   Diagnosis Date Noted   Low back pain 03/07/2015   Preventative health care 03/07/2015   ALLERGIC RHINITIS CAUSE UNSPECIFIED [id number]   DYSMETABOLIC SYNDROME [id number]   PURE HYPERCHOLESTEROLEMIA 12/10/2006   Type 2 diabetes mellitus ([place]) 09/09/2006   OBESITY, MORBID 09/01/2006   Obstructive sleep apnea 09/01/2006   Essential hypertension 09/01/2006    Olean Ree, PTA 04/20/2015, 9:28 AM  [facility] [address]  [place], [place], [address] Phone: [phone]   Fax:  [phone]  Name: Antonio Horton MRN: [id number] Date of Birth: [birth year]

## 2015-04-23 ENCOUNTER — Ambulatory Visit: Payer: 59 | Attending: Neurosurgery

## 2015-04-23 DIAGNOSIS — M5430 Sciatica, unspecified side: Secondary | ICD-10-CM | POA: Insufficient documentation

## 2015-04-23 DIAGNOSIS — M545 Low back pain: Secondary | ICD-10-CM | POA: Insufficient documentation

## 2015-04-23 DIAGNOSIS — R262 Difficulty in walking, not elsewhere classified: Secondary | ICD-10-CM | POA: Diagnosis not present

## 2015-04-23 DIAGNOSIS — M5441 Lumbago with sciatica, right side: Secondary | ICD-10-CM | POA: Diagnosis present

## 2015-04-23 DIAGNOSIS — M5440 Lumbago with sciatica, unspecified side: Secondary | ICD-10-CM

## 2015-04-23 NOTE — Therapy (Signed)
[facility] [address]  [place], [place], [address] Phone: [phone]   Fax:  [phone]  Physical Therapy Treatment  Patient Details  Name: Antonio Horton MRN: [id number] Date of Birth: August 18, 1965 Referring Provider: Ophelia Charter, MD  Encounter Date: 04/23/2015      PT End of Session - 04/23/15 1718    Visit Number 4   Number of Visits 16   Date for PT Re-Evaluation 06/05/15   PT Start Time 1708   PT Stop Time [id number]   PT Time Calculation (min) 40 min   Activity Tolerance Patient tolerated treatment well;Patient limited by pain   Behavior During Therapy [facility] for tasks assessed/performed      Past Medical History  Diagnosis Date  . Hypertension   . Borderline diabetes     Past Surgical History  Procedure Laterality Date  . Eye surgery      Corneal dystrophies    There were no vitals filed for this visit.  Visit Diagnosis:  Difficulty walking  Low back pain of thoracolumbar region with sciatica  Right-sided low back pain with sciatica, sciatica laterality unspecified      Subjective Assessment - 04/23/15 1746    Subjective Reports 4/10 LBP currently.  No other pain or complaints reported.     Patient Stated Goals "To move around with less pain and pick stuff up off the ground."   Currently in Pain? Yes   Pain Score 4    Pain Location Back   Pain Orientation Right;Left   Pain Descriptors / Indicators Throbbing;Sharp   Pain Type Acute pain   Pain Radiating Towards down both legs, and up around the ribs and side   Pain Onset More than a month ago   Pain Frequency Constant   Aggravating Factors  changing positions fomr supine to sitting, sitting to standing    Pain Relieving Factors ibuprofen   Multiple Pain Sites No     Today Treatment:  Therex: NuStep: 4 min, level 3 B HS, Piri, RF, SKTC x 30 sec each LE marching with abdominal bracing 5" 2 x 10 reps Bridging x 10 reps;  limited ROM due to pain.   Abdominal bracing 5" hold x 10 reps  Hooklying B hip abd / ER with black TB around knees   Bridging x 10 reps; limited ROM due to pain.         PT Short Term Goals - 04/17/15 1822    PT SHORT TERM GOAL #1   Title Pt will be independent with initial HEP by 05/01/15   Status On-going           PT Long Term Goals - 04/17/15 1823    PT LONG TERM GOAL #1   Title Pt will be independent with latest HEP by 06/05/15   Status On-going   PT LONG TERM GOAL #2   Title Pt will demonstrate lumbar ROM at least 75% of normal by 06/05/15   Status On-going   PT LONG TERM GOAL #3   Title Pt will demonstrate proper body mechanics with bending and lifting from low heights to prevent exacerbation of low back pain by 06/05/15   Status On-going   PT LONG TERM GOAL #4   Title Pt will report ability to walk >30 minutes without limitation due to low back or LE pain by 06/05/15   Status On-going               Plan -  04/23/15 1715    Clinical Impression Statement continues to be significantly limited with lubmopelvic strengthening and stretching activity secondary to LBP.  Pt. only able to perform conservative flexion based activities in hooklying secondary to pain.  Pt. initial LBP 4/10 increasing to 5/10 LBP following therex.     PT Next Visit Plan Poossible mechanical traction; Lumbar/proximal LE flexibility and strengthening; Manual therapy, Taping & Modalities PRN for pain        Problem List Patient Active Problem List   Diagnosis Date Noted  . Low back pain 03/07/2015  . Preventative health care 03/07/2015  . ALLERGIC RHINITIS CAUSE UNSPECIFIED 06/11/2007  . DYSMETABOLIC SYNDROME [id number]  . PURE HYPERCHOLESTEROLEMIA 12/10/2006  . Type 2 diabetes mellitus ([place]) 09/09/2006  . OBESITY, MORBID 09/01/2006  . Obstructive sleep apnea 09/01/2006  . Essential hypertension 09/01/2006    Bess Harvest, PTA 04/23/2015, 6:01 PM  [facility] [address]  [place], [place], [address] Phone: [phone]   Fax:  [phone]  Name: Antonio Horton MRN: [id number] Date of Birth: 06/09/1965

## 2015-04-26 ENCOUNTER — Ambulatory Visit: Payer: 59 | Admitting: Physical Therapy

## 2015-04-30 ENCOUNTER — Ambulatory Visit: Payer: 59

## 2015-04-30 DIAGNOSIS — R262 Difficulty in walking, not elsewhere classified: Secondary | ICD-10-CM | POA: Diagnosis not present

## 2015-04-30 DIAGNOSIS — M5441 Lumbago with sciatica, right side: Secondary | ICD-10-CM

## 2015-04-30 NOTE — Therapy (Addendum)
[facility] [address]  [place], [place], [address] Phone: [phone]   Fax:  [phone]  Physical Therapy Treatment  Patient Details  Name: Antonio Horton MRN: [id number] Date of Birth: 23-Dec-1965 Referring Provider: Ophelia Charter, MD  Encounter Date: 04/30/2015      PT End of Session - 04/30/15 1705    Visit Number 5   Number of Visits 16   Date for PT Re-Evaluation 06/05/15   PT Start Time [id number]   PT Stop Time 1759   PT Time Calculation (min) 54 min   Activity Tolerance Patient tolerated treatment well;Patient limited by pain   Behavior During Therapy [facility] for tasks assessed/performed      Past Medical History  Diagnosis Date  . Hypertension   . Borderline diabetes     Past Surgical History  Procedure Laterality Date  . Eye surgery      Corneal dystrophies    There were no vitals filed for this visit.      Subjective Assessment - 04/30/15 1813    Subjective Reports 6/10 LBP currently.  No other pain or complaints reported.     Patient Stated Goals "To move around with less pain and pick stuff up off the ground."   Currently in Pain? Yes   Pain Score 6    Pain Location Back   Pain Orientation Right;Left   Pain Descriptors / Indicators Throbbing;Sharp   Pain Type Acute pain   Pain Onset More than a month ago   Multiple Pain Sites No        Today Treatment:  Therex: NuStep: 4 min, level 3 B HS, Piri, RF, SKTC x 30 sec each LE marching with abdominal bracing 5" x 10 reps LE marching with abdominal bracing 5"x 10 reps with 2# cuff weights around ankles  Abdominal bracing 5" hold x 10 reps  Hooklying pullover with 8# dumbbell with abdominal bracing x 15 reps    Mechanical Traction: Hooklying, neutral pull, 25# / 50#, 20"/60", 15'        PT Short Term Goals - 04/30/15 1759    PT SHORT TERM GOAL #1   Title Pt will be independent with initial HEP by 05/01/15  04/30/15:  pt. independent with initial HEP.     Status Achieved           PT Long Term Goals - 04/17/15 1823    PT LONG TERM GOAL #1   Title Pt will be independent with latest HEP by 06/05/15   Status On-going   PT LONG TERM GOAL #2   Title Pt will demonstrate lumbar ROM at least 75% of normal by 06/05/15   Status On-going   PT LONG TERM GOAL #3   Title Pt will demonstrate proper body mechanics with bending and lifting from low heights to prevent exacerbation of low back pain by 06/05/15   Status On-going   PT LONG TERM GOAL #4   Title Pt will report ability to walk >30 minutes without limitation due to low back or LE pain by 06/05/15   Status On-going               Plan - 04/30/15 1706    Clinical Impression Statement Pt. with 6/10 LBP initially today which remained unchanged throughout therex.  Lumbar traction initiated today with hooklying, neutral pull, #25 / #50; pt. reported LBP unchanged following lumbar traction to L-spine.     Clinical Impairments Affecting Rehab Potential  Morbid obesity, diabetes   PT Frequency --   PT Duration --   PT Treatment/Interventions Patient/family education;Therapeutic exercise;Manual techniques;Dry needling;Taping;Ultrasound;Moist Heat;Electrical Stimulation;Cryotherapy;Iontophoresis 4mg /ml Dexamethasone;Traction;Therapeutic activities;Functional mobility training;ADLs/Self Care Home Management;Neuromuscular re-education   PT Next Visit Plan continue to progress mechanical traction; Lumbar/proximal LE flexibility and strengthening; Manual therapy, Taping & Modalities PRN for pain   Consulted and Agree with Plan of Care Patient      Patient will benefit from skilled therapeutic intervention in order to improve the following deficits and impairments:  Pain, Impaired flexibility, Decreased range of motion, Increased muscle spasms, Improper body mechanics, Postural dysfunction, Decreased strength, Difficulty walking, Decreased activity tolerance,  Decreased balance  Visit Diagnosis: Right-sided low back pain with sciatica, sciatica laterality unspecified - Plan: PT plan of care cert/re-cert  Difficulty in walking, not elsewhere classified - Plan: PT plan of care cert/re-cert     Problem List Patient Active Problem List   Diagnosis Date Noted  . Low back pain 03/07/2015  . Preventative health care 03/07/2015  . ALLERGIC RHINITIS CAUSE UNSPECIFIED 06/11/2007  . DYSMETABOLIC SYNDROME [id number]  . PURE HYPERCHOLESTEROLEMIA 12/10/2006  . Type 2 diabetes mellitus ([place]) 09/09/2006  . OBESITY, MORBID 09/01/2006  . Obstructive sleep apnea 09/01/2006  . Essential hypertension 09/01/2006    Bess Harvest, PTA 04/30/2015, 6:14 PM  [facility] [address]  [place], [place], [address] Phone: [phone]   Fax:  [phone]  Name: Antonio Horton MRN: [id number] Date of Birth: 12/29/1965    Percival Spanish, PT, MPT 05/01/2015, 8:28 PM  [facility] [address]  [place], [place], [address] Phone: [phone]   Fax:  [phone]

## 2015-05-01 NOTE — Addendum Note (Signed)
Addended by: Percival Spanish on: 05/01/2015 08:28 PM   Modules accepted: Orders

## 2015-05-03 ENCOUNTER — Ambulatory Visit: Payer: 59 | Admitting: Physical Therapy

## 2015-05-03 DIAGNOSIS — M5441 Lumbago with sciatica, right side: Secondary | ICD-10-CM

## 2015-05-03 DIAGNOSIS — R262 Difficulty in walking, not elsewhere classified: Secondary | ICD-10-CM | POA: Diagnosis not present

## 2015-05-03 NOTE — Patient Instructions (Signed)
TENS UNIT: This is helpful for muscle pain and spasm.   Search and Purchase a TENS 7000 2nd edition at www.tenspros.com. It should be less than $30.     TENS unit instructions: Do not shower or bathe with the unit on  Turn the unit off before removing electrodes or batteries  If the electrodes lose stickiness add a drop of water to the electrodes after they are disconnected from the unit and place on plastic sheet. If you continued to have difficulty, call the TENS unit company to purchase more electrodes.  Do not apply lotion on the skin area prior to use. Make sure the skin is clean and dry as this will help prolong the life of the electrodes.  After use, always check skin for unusual red areas, rash or other skin difficulties. If there are any skin problems, does not apply electrodes to the same area.  Never remove the electrodes from the unit by pulling the wires.  Do not use the TENS unit or electrodes other than as directed.  Do not change electrode placement without consulting your therapist or physician.  Keep 2 fingers with between each electrode.

## 2015-05-03 NOTE — Therapy (Signed)
[facility] [address]  [place], [place], [address] Phone: [phone]   Fax:  [phone]  Physical Therapy Treatment  Patient Details  Name: Antonio Horton MRN: [id number] Date of Birth: 05/28/1965 Referring Provider: Ophelia Charter, MD  Encounter Date: 05/03/2015      PT End of Session - 05/03/15 1747    Visit Number 6   Number of Visits 16   Date for PT Re-Evaluation 06/05/15   PT Start Time 1701   PT Stop Time 1756   PT Time Calculation (min) 55 min   Activity Tolerance Patient tolerated treatment well;Patient limited by pain   Behavior During Therapy [facility] for tasks assessed/performed      Past Medical History  Diagnosis Date  . Hypertension   . Borderline diabetes     Past Surgical History  Procedure Laterality Date  . Eye surgery      Corneal dystrophies    There were no vitals filed for this visit.      Subjective Assessment - 05/03/15 1703    Subjective Pt reports he "was not a fan of the lumbar traction" at the last visit and did not feel that it helped. Pt not feeling like therapy has helped much so far and continues to experience pain, "mainly centered on movement"   Patient Stated Goals "To move around with less pain and pick stuff up off the ground."   Currently in Pain? Yes   Pain Score 6    Pain Location Back   Pain Orientation Lower   Pain Descriptors / Indicators Throbbing;Sharp          Today's Treatment  TherEx NuStep: 4 min, level 3 Seated Prayer stretch with red (75 cm) [place] 3x20", L/R 1x20" each B HS, piri, SKTC, 2x30" each  DKTC with feet on orange (55 cm) Pball 2x12 Bridging 2x12 reps; limited ROM  Modalities IFC (80-150 Hz) to lumbar paraspinals in hooklying, intensity to pt tolerance (9) x15' Ice pack to lumbar spine x15'         PT Education - 05/03/15 1757    Education provided Yes   Education Details TENS unit for home use    Person(s) Educated Patient   Methods Explanation;Demonstration;Handout   Comprehension Verbalized understanding          PT Short Term Goals - 04/30/15 1759    PT SHORT TERM GOAL #1   Title Pt will be independent with initial HEP by 05/01/15  04/30/15: pt. independent with initial HEP.     Status Achieved           PT Long Term Goals - 05/03/15 1745    PT LONG TERM GOAL #1   Title Pt will be independent with latest HEP by 06/05/15   Status On-going   PT LONG TERM GOAL #2   Title Pt will demonstrate lumbar ROM at least 75% of normal by 06/05/15   Status On-going   PT LONG TERM GOAL #3   Title Pt will demonstrate proper body mechanics with bending and lifting from low heights to prevent exacerbation of low back pain by 06/05/15   Status On-going   PT LONG TERM GOAL #4   Title Pt will report ability to walk >30 minutes without limitation due to low back or LE pain by 06/05/15   Status On-going               Plan - 05/03/15 1739  Clinical Impression Statement Pt continues to report LBP fairly consistently at 6/10 level which intensifies with any attempts at lateral or rotational movements, especially to the R. Some relief with supine stretching but otherwise pain unchanged. Pt reporting no relief from mechanical traction attempted at last visit. Initiated trial of estim using IFC in conjunction with ice pack to lumbar spine with pt reporting decrease in pain to 3/10. Will assess long term response to estim at next visit. Info provided regarding home TENS unit.   PT Next Visit Plan Assess long term reponse to estim; Consider repeat trial of mechanical traction + estim; Lumbar/proximal LE flexibility and strengthening; Manual therapy, Taping & Modalities PRN for pain   Consulted and Agree with Plan of Care Patient      Patient will benefit from skilled therapeutic intervention in order to improve the following deficits and impairments:  Pain, Impaired flexibility, Decreased  range of motion, Increased muscle spasms, Improper body mechanics, Postural dysfunction, Decreased strength, Difficulty walking, Decreased activity tolerance, Decreased balance  Visit Diagnosis: Right-sided low back pain with sciatica, sciatica laterality unspecified  Difficulty in walking, not elsewhere classified     Problem List Patient Active Problem List   Diagnosis Date Noted  . Low back pain 03/07/2015  . Preventative health care 03/07/2015  . ALLERGIC RHINITIS CAUSE UNSPECIFIED 06/11/2007  . DYSMETABOLIC SYNDROME [id number]  . PURE HYPERCHOLESTEROLEMIA 12/10/2006  . Type 2 diabetes mellitus ([place]) 09/09/2006  . OBESITY, MORBID 09/01/2006  . Obstructive sleep apnea 09/01/2006  . Essential hypertension 09/01/2006    Percival Spanish, PT, MPT 05/03/2015, 6:06 PM  [facility] [address]  [place], [place], [address] Phone: [phone]   Fax:  [phone]  Name: Antonio Horton MRN: [id number] Date of Birth: Jan 09, 1966

## 2015-05-07 ENCOUNTER — Ambulatory Visit: Payer: 59

## 2015-05-07 DIAGNOSIS — R262 Difficulty in walking, not elsewhere classified: Secondary | ICD-10-CM

## 2015-05-07 DIAGNOSIS — M5441 Lumbago with sciatica, right side: Secondary | ICD-10-CM

## 2015-05-07 NOTE — Therapy (Signed)
[facility] [address]  [place], [place], [address] Phone: [phone]   Fax:  [phone]  Physical Therapy Treatment  Patient Details  Name: Antonio Horton MRN: [id number] Date of Birth: March 25, 1965 Referring Provider: Ophelia Charter, MD  Encounter Date: 05/07/2015      PT End of Session - 05/07/15 1722    Visit Number 7   Number of Visits 16   Date for PT Re-Evaluation 06/05/15   PT Start Time 1703   PT Stop Time 1804   PT Time Calculation (min) 61 min   Activity Tolerance Patient tolerated treatment well;Patient limited by pain   Behavior During Therapy [facility] for tasks assessed/performed      Past Medical History  Diagnosis Date  . Hypertension   . Borderline diabetes     Past Surgical History  Procedure Laterality Date  . Eye surgery      Corneal dystrophies    There were no vitals filed for this visit.      Subjective Assessment - 05/07/15 1720    Patient Stated Goals "To move around with less pain and pick stuff up off the ground."   Currently in Pain? Yes   Pain Score 7    Pain Location Back   Pain Orientation Lower   Pain Descriptors / Indicators Throbbing;Sharp   Pain Type Acute pain   Pain Radiating Towards down R legs, and up around the ribs    Pain Onset More than a month ago   Pain Frequency Constant   Aggravating Factors  changing positions from supine to sitting, sitting to standing   Pain Relieving Factors ibuprofen   Multiple Pain Sites No     Today's Treatment:  TherEx: B HS, piri, SKTC, 2x30" each  HS curl with heels on peanut p-ball x 15 reps Hooklying abdominal bracing x 15 min  Bridging with limited ROM with heels on peanut p-ball x 10 reps   Modalities: IFC (80-150 Hz) to lumbar paraspinals in hooklying, intensity to pt tolerance (14) x15' Hooklying bridging 2 x 10 reps (with IFC) Hooklying pullover with abdom. bracing with 8# dumbbell 2 x 15 reps   Mechanical Traction: Hooklying, neutral pull, 30# / 60#, 20"/60", 15'  (with IFC)         PT Short Term Goals - 04/30/15 1759    PT SHORT TERM GOAL #1   Title Pt will be independent with initial HEP by 05/01/15  04/30/15: pt. independent with initial HEP.     Status Achieved           PT Long Term Goals - 05/03/15 1745    PT LONG TERM GOAL #1   Title Pt will be independent with latest HEP by 06/05/15   Status On-going   PT LONG TERM GOAL #2   Title Pt will demonstrate lumbar ROM at least 75% of normal by 06/05/15   Status On-going   PT LONG TERM GOAL #3   Title Pt will demonstrate proper body mechanics with bending and lifting from low heights to prevent exacerbation of low back pain by 06/05/15   Status On-going   PT LONG TERM GOAL #4   Title Pt will report ability to walk >30 minutes without limitation due to low back or LE pain by 06/05/15   Status On-going               Plan - 05/07/15 1723    Clinical Impression Statement Pt. with increased  lumbopelvic strengthening activity tolerance with addition of IFC E-stim today.  E-stim used in combo with lumbopelvic strengthening activity; pt. able to bridge and tolerate continued mechanical traction today with addition of E-stim.  Pt. verbalized that E-stim has "worked better than anything so far" in regards to PT.  Pt. would benefit from advancement of lumbopelvic strengthening activity with E-stim combo.     PT Treatment/Interventions Patient/family education;Therapeutic exercise;Manual techniques;Dry needling;Taping;Ultrasound;Moist Heat;Electrical Stimulation;Cryotherapy;Iontophoresis 4mg /ml Dexamethasone;Traction;Therapeutic activities;Functional mobility training;ADLs/Self Care Home Management;Neuromuscular re-education   PT Next Visit Plan Assess long term reponse to estim; Consider repeat trial of mechanical traction + estim; Lumbar/proximal LE flexibility and strengthening; Manual therapy, Taping & Modalities PRN for pain    Consulted and Agree with Plan of Care Patient      Patient will benefit from skilled therapeutic intervention in order to improve the following deficits and impairments:  Pain, Impaired flexibility, Decreased range of motion, Increased muscle spasms, Improper body mechanics, Postural dysfunction, Decreased strength, Difficulty walking, Decreased activity tolerance, Decreased balance  Visit Diagnosis: Right-sided low back pain with sciatica, sciatica laterality unspecified  Difficulty in walking, not elsewhere classified     Problem List Patient Active Problem List   Diagnosis Date Noted  . Low back pain 03/07/2015  . Preventative health care 03/07/2015  . ALLERGIC RHINITIS CAUSE UNSPECIFIED 06/11/2007  . DYSMETABOLIC SYNDROME [id number]  . PURE HYPERCHOLESTEROLEMIA 12/10/2006  . Type 2 diabetes mellitus ([place]) 09/09/2006  . OBESITY, MORBID 09/01/2006  . Obstructive sleep apnea 09/01/2006  . Essential hypertension 09/01/2006    Bess Harvest, PTA 05/07/2015, 6:12 PM  [facility] [address]  [place], [place], [address] Phone: [phone]   Fax:  [phone]  Name: Antonio Horton MRN: [id number] Date of Birth: 05/30/1965

## 2015-05-10 ENCOUNTER — Ambulatory Visit: Payer: 59 | Admitting: Physical Therapy

## 2015-05-10 DIAGNOSIS — M5441 Lumbago with sciatica, right side: Secondary | ICD-10-CM

## 2015-05-10 DIAGNOSIS — R262 Difficulty in walking, not elsewhere classified: Secondary | ICD-10-CM | POA: Diagnosis not present

## 2015-05-10 NOTE — Therapy (Signed)
[facility] [address]  [place], [place], [address] Phone: [phone]   Fax:  [phone]  Physical Therapy Treatment  Patient Details  Name: Antonio Horton MRN: [id number] Date of Birth: 27-Mar-1965 Referring Provider: Ophelia Charter, MD  Encounter Date: 05/10/2015      PT End of Session - 05/10/15 1714    Visit Number 8   Number of Visits 16   Date for PT Re-Evaluation 06/05/15   PT Start Time 1706   PT Stop Time 1758   PT Time Calculation (min) 52 min   Activity Tolerance Patient tolerated treatment well;Patient limited by pain   Behavior During Therapy [facility] for tasks assessed/performed      Past Medical History  Diagnosis Date  . Hypertension   . Borderline diabetes     Past Surgical History  Procedure Laterality Date  . Eye surgery      Corneal dystrophies    There were no vitals filed for this visit.      Subjective Assessment - 05/10/15 1711    Subjective Pt feels like estim has helped but still not noting any benefit from mechanical traction and states it actually seems to make the pain worse later in the day. Pain less at the moment due to taking anti-inflammatory dose of Ibuprofen (800 mg).   Currently in Pain? Yes   Pain Score 4    Pain Location Back   Pain Orientation Lower         Today's Treatment  TherEx NuStep - lvl 4 x 4' Seated Prayer stretch with red (75 cm) Pball - Center 3x20" Trunk extension in sitting at edge of mat table  BATCA Low Row 20# x10, 25# x10 BATCA Single Arm Low Row 15# x10 each Standing Black TB Low Row 10x3" Standing Black TB Scapular Retraction + Shoulder Extension to neutral 10x3" DKTC with feet on orange (55 cm) Pball 2x10 LTR with feet on orange (55 cm) Pball x10 Bridge with feet on orange (55 cm) Pball x8 (stopped due to increasing discomfort on eccentric lowering) DKTC with feet on orange (55 cm) Pball 2x10  Modalities IFC (80-150  Hz) to lumbar paraspinals in hooklying, intensity to pt tolerance x20' (1st 10' during supine exercises and last 10' in hooklying with legs over bolster)         PT Education - 05/10/15 1816    Education provided Yes   Education Details Addition to [employer]) Educated Patient   Methods Explanation;Demonstration;Handout   Comprehension Verbalized understanding;Returned demonstration          PT Short Term Goals - 04/30/15 1759    PT SHORT TERM GOAL #1   Title Pt will be independent with initial HEP by 05/01/15  04/30/15: pt. independent with initial HEP.     Status Achieved           PT Long Term Goals - 05/10/15 1755    PT LONG TERM GOAL #1   Title Pt will be independent with latest HEP by 06/05/15   Status On-going   PT LONG TERM GOAL #2   Title Pt will demonstrate lumbar ROM at least 75% of normal by 06/05/15   Status On-going   PT LONG TERM GOAL #3   Title Pt will demonstrate proper body mechanics with bending and lifting from low heights to prevent exacerbation of low back pain by 06/05/15   Status On-going   PT LONG TERM GOAL #4  Title Pt will report ability to walk >30 minutes without limitation due to low back or LE pain by 06/05/15   Status On-going               Plan - 05/10/15 1804    Clinical Impression Statement Pt continues to note reduction in pain from estim but still not liking mechanical traction, stating pain actually worsens afterward therefore deferred traction today. Pt reports he has not looked in to home TENS unit but still has the info previously provided. Some relief noted with trunk extension in sitting but unable to tolerate prone or quadruped, therefore focused on extension based activities in sitting and standing.   PT Treatment/Interventions Patient/family education;Therapeutic exercise;Manual techniques;Dry needling;Taping;Ultrasound;Moist Heat;Electrical Stimulation;Cryotherapy;Iontophoresis 4mg /ml  Dexamethasone;Traction;Therapeutic activities;Functional mobility training;ADLs/Self Care Home Management;Neuromuscular re-education   PT Next Visit Plan Lumbar/proximal LE flexibility and strengthening with increased extension emphasis?; Manual therapy, Trial of taping?; Modalities PRN for pain   Consulted and Agree with Plan of Care Patient      Patient will benefit from skilled therapeutic intervention in order to improve the following deficits and impairments:  Pain, Impaired flexibility, Decreased range of motion, Increased muscle spasms, Improper body mechanics, Postural dysfunction, Decreased strength, Difficulty walking, Decreased activity tolerance, Decreased balance  Visit Diagnosis: Right-sided low back pain with sciatica, sciatica laterality unspecified  Difficulty in walking, not elsewhere classified     Problem List Patient Active Problem List   Diagnosis Date Noted  . Low back pain 03/07/2015  . Preventative health care 03/07/2015  . ALLERGIC RHINITIS CAUSE UNSPECIFIED 06/11/2007  . DYSMETABOLIC SYNDROME [id number]  . PURE HYPERCHOLESTEROLEMIA 12/10/2006  . Type 2 diabetes mellitus ([place]) 09/09/2006  . OBESITY, MORBID 09/01/2006  . Obstructive sleep apnea 09/01/2006  . Essential hypertension 09/01/2006    Antonio Horton, PT, MPT 05/10/2015, 6:27 PM  [facility] [address]  [place], [place], [address] Phone: [phone]   Fax:  [phone]  Name: Antonio Horton MRN: [id number] Date of Birth: 06/28/1965

## 2015-05-14 ENCOUNTER — Ambulatory Visit: Payer: 59 | Admitting: Rehabilitation

## 2015-05-14 DIAGNOSIS — R262 Difficulty in walking, not elsewhere classified: Secondary | ICD-10-CM | POA: Diagnosis not present

## 2015-05-14 DIAGNOSIS — M5441 Lumbago with sciatica, right side: Secondary | ICD-10-CM

## 2015-05-14 DIAGNOSIS — M5440 Lumbago with sciatica, unspecified side: Secondary | ICD-10-CM

## 2015-05-14 NOTE — Therapy (Signed)
[facility] [address] [facility] [address]  [place], [place], [address] Phone: [phone]   Fax:  [phone]  Physical Therapy Treatment  Patient Details  Name: Antonio Horton MRN: [id number] Date of Birth: November 28, 1965 Referring Provider: Ophelia Charter, MD  Encounter Date: 05/14/2015      PT End of Session - 05/14/15 1612    Visit Number 9   Number of Visits 16   Date for PT Re-Evaluation 06/05/15   PT Start Time [id number]   PT Stop Time 1628   PT Time Calculation (min) 55 min   Activity Tolerance Patient tolerated treatment well      Past Medical History  Diagnosis Date  . Hypertension   . Borderline diabetes     Past Surgical History  Procedure Laterality Date  . Eye surgery      Corneal dystrophies    There were no vitals filed for this visit.      Subjective Assessment - 05/14/15 1535    Subjective not feeling great today maybe due to the weather.  stiffer than normal.     Currently in Pain? Yes   Pain Score 6    Pain Location Back   Pain Orientation Lower;Mid;Right   Pain Descriptors / Indicators Throbbing;Sharp     Today's Treatment  TherEx NuStep - lvl 4 x 4' Seated Prayer stretch with red (75 cm) Pball - Center 3x20"  BATCA Low Row  25# 2x10 BATCA Single Arm Low Row 15# x10 each Standing Black TB Scapular Retraction + Shoulder Extension to neutral 10x3" Standing with bolster at back at wall: wall angels x 10, alternating shoulder flexion x 10, presses into extension into the wall 6"x10  DKTC with feet on orange (55 cm) Pball x5 stopping due to R hip pain LTR  x10 Bridge with feet on orange (55 cm) Pball x 5 Sidelying open book x 6  Manual R manual hamstring stretch 3x10" at P1 with very gentle sciatic flossing x 15 with DF/PF L sidelying maitland lumbar rotations grade II for pain relief x 60"   Modalities IFC (80-150 Hz) to lumbar paraspinals in hooklying, intensity to pt tolerance  x15                                  PT Short Term Goals - 04/30/15 1759    PT SHORT TERM GOAL #1   Title Pt will be independent with initial HEP by 05/01/15  04/30/15: pt. independent with initial HEP.     Status Achieved           PT Long Term Goals - 05/10/15 1755    PT LONG TERM GOAL #1   Title Pt will be independent with latest HEP by 06/05/15   Status On-going   PT LONG TERM GOAL #2   Title Pt will demonstrate lumbar ROM at least 75% of normal by 06/05/15   Status On-going   PT LONG TERM GOAL #3   Title Pt will demonstrate proper body mechanics with bending and lifting from low heights to prevent exacerbation of low back pain by 06/05/15   Status On-going   PT LONG TERM GOAL #4   Title Pt will report ability to walk >30 minutes without limitation due to low back or LE pain by 06/05/15   Status On-going               Plan -  05/14/15 1612    Clinical Impression Statement Pt reports no decrease in pain with PT so far.  Continues with low tolerance to exercise and transfers during treatment.     PT Next Visit Plan Lumbar/proximal LE flexibility and strengthening with increased extension emphasis?; Manual therapy, Trial of taping?; Modalities PRN for pain      Patient will benefit from skilled therapeutic intervention in order to improve the following deficits and impairments:     Visit Diagnosis: Right-sided low back pain with sciatica, sciatica laterality unspecified  Difficulty in walking, not elsewhere classified  Low back pain of thoracolumbar region with sciatica     Problem List Patient Active Problem List   Diagnosis Date Noted  . Low back pain 03/07/2015  . Preventative health care 03/07/2015  . ALLERGIC RHINITIS CAUSE UNSPECIFIED 06/11/2007  . DYSMETABOLIC SYNDROME [id number]  . PURE HYPERCHOLESTEROLEMIA 12/10/2006  . Type 2 diabetes mellitus ([place]) 09/09/2006  . OBESITY, MORBID 09/01/2006  . Obstructive sleep apnea  09/01/2006  . Essential hypertension 09/01/2006    Stark Bray, DPT, CMP 05/14/2015, 4:14 PM  [facility] [address]  [place], [place], [address] Phone: [phone]   Fax:  [phone]  Name: Antonio Horton MRN: [id number] Date of Birth: 1965/07/12

## 2015-05-17 ENCOUNTER — Ambulatory Visit: Payer: 59 | Admitting: Physical Therapy

## 2015-05-17 DIAGNOSIS — M5441 Lumbago with sciatica, right side: Secondary | ICD-10-CM

## 2015-05-17 DIAGNOSIS — R262 Difficulty in walking, not elsewhere classified: Secondary | ICD-10-CM

## 2015-05-17 NOTE — Therapy (Addendum)
[facility] [address]  [place], [place], [address] Phone: [phone]   Fax:  [phone]  Physical Therapy Treatment  Patient Details  Name: Antonio Horton MRN: [id number] Date of Birth: 05/14/1965 Referring Provider: Ophelia Charter, MD  Encounter Date: 05/17/2015      PT End of Session - 05/17/15 1506    Visit Number 10   Number of Visits 16   Date for PT Re-Evaluation 06/05/15   PT Start Time 1450   PT Stop Time 1546   PT Time Calculation (min) 56 min   Activity Tolerance Patient tolerated treatment well;Patient limited by pain   Behavior During Therapy [facility] for tasks assessed/performed      Past Medical History  Diagnosis Date  . Hypertension   . Borderline diabetes     Past Surgical History  Procedure Laterality Date  . Eye surgery      Corneal dystrophies    There were no vitals filed for this visit.      Subjective Assessment - 05/17/15 1454    Subjective Pt reports he has only been able to get relief from therapeutic exercises/activities for no more than brief periods, with stretches most beneficial.   How long can you sit comfortably? 45 minutes   How long can you stand comfortably? 30 minutes   How long can you walk comfortably? 30 minutes before pain in leg causes limp   Patient Stated Goals "To move around with less pain and pick stuff up off the ground."   Currently in Pain? Yes   Pain Score 5   Least 3/10, Avg 5/10, Worst 8/10            [facility] PT Assessment - 05/17/15 0001    Observation/Other Assessments   Focus on Therapeutic Outcomes (FOTO)  Lumbar spine - 40% (60% limitation)   AROM   Lumbar Flexion 90%  pain upon return to standing   Lumbar Extension 30%  pain into movement   Lumbar - Right Side Bend 40%  pain   Lumbar - Left Side Bend 30%  pain   Lumbar - Right Rotation 25%  pain   Lumbar - Left Rotation 75%  pain           Today's  Treatment  TherEx NuStep - lvl 4 x 4'  Lumbar ROM Assessment  Manual R manual hamstring stretch 3x10" at P1 with very gentle sciatic flossing x 15 with DF/PF B Piriformis and SKTC stretches 2x30"  TherEx DKTC with feet on green (65 cm) Pball x10 Bridge with feet on green (65 cm) Pball x 10 LTRx10 B Sidelying open book x10  Modalities IFC (80-150 Hz) to lumbar paraspinals in hooklying, intensity to pt tolerance x15' Cold pack to lumbar spine x15'  Kinesiotaping - 2 parallel "I" strips to lumbar paraspinals          PT Short Term Goals - 04/30/15 1759    PT SHORT TERM GOAL #1   Title Pt will be independent with initial HEP by 05/01/15  04/30/15: pt. independent with initial HEP.     Status Achieved           PT Long Term Goals - 05/17/15 1525    PT LONG TERM GOAL #1   Title Pt will be independent with latest HEP by 06/05/15   Status On-going   PT LONG TERM GOAL #2   Title Pt will demonstrate lumbar ROM at least 75% of normal by  06/05/15   Status On-going   PT LONG TERM GOAL #3   Title Pt will demonstrate proper body mechanics with bending and lifting from low heights to prevent exacerbation of low back pain by 06/05/15   Status On-going   PT LONG TERM GOAL #4   Title Pt will report ability to walk >30 minutes without limitation due to low back or LE pain by 06/05/15   Status On-going               Plan - 05/17/15 1526    Clinical Impression Statement Pt has completed 10 visits with PT without significant change in pain or motion of lumbar spine. He reports he has only been able to get relief from therapeutic exercises/activities for brief periods before pain returns/increases, and finds stretches to be most beneficial. Mechanical traction attempted but eventually deferred as pt felt that it made his pain worse. Some benefit noted from estim, but again no long-term benefit. Pt has been provided with information regarding obtaining a home TENS unit if he  desires. No goals met at this time secondary to above. Discussed lack of progress with pt and limited response to PT interventions, and recommended pt follow up with MD. Will place pt on hold for 30 days while he schedules an appointment to see the MD, and will resume vs D/C pending MD plan.   PT Next Visit Plan 30 day while pt follows up with MD   Consulted and Agree with Plan of Care Patient      Patient will benefit from skilled therapeutic intervention in order to improve the following deficits and impairments:  Pain, Impaired flexibility, Decreased range of motion, Increased muscle spasms, Improper body mechanics, Postural dysfunction, Decreased strength, Difficulty walking, Decreased activity tolerance, Decreased balance  Visit Diagnosis: Right-sided low back pain with sciatica, sciatica laterality unspecified  Difficulty in walking, not elsewhere classified     Problem List Patient Active Problem List   Diagnosis Date Noted  . Low back pain 03/07/2015  . Preventative health care 03/07/2015  . ALLERGIC RHINITIS CAUSE UNSPECIFIED 06/11/2007  . DYSMETABOLIC SYNDROME 46/80/3212  . PURE HYPERCHOLESTEROLEMIA 12/10/2006  . Type 2 diabetes mellitus ([employer]) 09/09/2006  . OBESITY, MORBID 09/01/2006  . Obstructive sleep apnea 09/01/2006  . Essential hypertension 09/01/2006    Percival Spanish, PT, MPT 05/17/2015, 3:51 PM  [facility] [address]  [place], [place], [address] Phone: [phone]   Fax:  [phone]  Name: Antonio Horton MRN: [id number] Date of Birth: 1965/06/23   PHYSICAL THERAPY DISCHARGE SUMMARY  Visits from Start of Care: 10  Current functional level related to goals / functional outcomes:   As of last PT visit, pt had completed 10 visits with PT without significant change in pain or motion of lumbar spine. He reported he had only been able to get relief from therapeutic exercises/activities  for brief periods before pain returns/increases, and found stretches to be most beneficial. Mechanical traction attempted but eventually deferred as pt felt that it made his pain worse. Some benefit noted from estim, but again no long-term benefit. Pt had been provided with information regarding obtaining a home TENS unit if he desired. No goals were met at the time secondary to above. Discussed lack of progress with pt and limited response to PT interventions, and recommended pt follow up with MD. Pt was placed on hold for 30 days while he scheduled an appointment to see the MD, with  PT to resume vs D/C pending MD plan.No further contact received from pt in >30 days, therefore will proceed with discharge.   Remaining deficits:   Ongoing LBP, not responsive to PT interventions   Education / Equipment:   HEP  Plan: Patient agrees to discharge.  Patient goals were partially met. (HEP only) Patient is being discharged due to lack of progress.  ?????       Percival Spanish, PT, MPT 06/25/2015, 1:23 PM  [facility] [address]  [place], [place], [address] Phone: [phone]   Fax:  [phone]

## 2015-05-21 ENCOUNTER — Ambulatory Visit: Payer: 59

## 2015-05-24 ENCOUNTER — Ambulatory Visit: Payer: 59 | Admitting: Physical Therapy

## 2015-06-05 ENCOUNTER — Other Ambulatory Visit (INDEPENDENT_AMBULATORY_CARE_PROVIDER_SITE_OTHER): Payer: 59

## 2015-06-05 ENCOUNTER — Other Ambulatory Visit: Payer: Self-pay | Admitting: Primary Care

## 2015-06-05 DIAGNOSIS — E785 Hyperlipidemia, unspecified: Secondary | ICD-10-CM

## 2015-06-05 DIAGNOSIS — E119 Type 2 diabetes mellitus without complications: Secondary | ICD-10-CM | POA: Diagnosis not present

## 2015-06-05 LAB — LIPID PANEL
CHOLESTEROL: 183 mg/dL (ref 0–200)
HDL: 29 mg/dL — AB (ref >39.00)
LDL Cholesterol: 125 mg/dL — ABNORMAL HIGH (ref 0–99)
NonHDL: 153.62
TRIGLYCERIDES: 141 mg/dL (ref 0.0–149.0)
Total CHOL/HDL Ratio: 6
VLDL: 28.2 mg/dL (ref 0.0–40.0)

## 2015-06-05 LAB — HEMOGLOBIN A1C: Hgb A1c MFr Bld: 6.4 % (ref 4.6–6.5)

## 2015-06-05 MED ORDER — ATORVASTATIN CALCIUM 10 MG PO TABS: 10.0000 mg | ORAL_TABLET | Freq: Every day | ORAL | Status: DC

## 2015-06-16 ENCOUNTER — Other Ambulatory Visit: Payer: Self-pay | Admitting: Primary Care

## 2015-07-12 ENCOUNTER — Other Ambulatory Visit: Payer: Self-pay | Admitting: Neurosurgery

## 2015-07-12 DIAGNOSIS — M5416 Radiculopathy, lumbar region: Secondary | ICD-10-CM

## 2015-08-02 ENCOUNTER — Ambulatory Visit
Admission: RE | Admit: 2015-08-02 | Discharge: 2015-08-02 | Disposition: A | Discharge status: Home / Self Care | Payer: 59 | Source: Ambulatory Visit | Attending: Neurosurgery | Admitting: Neurosurgery

## 2015-08-02 DIAGNOSIS — M5416 Radiculopathy, lumbar region: Secondary | ICD-10-CM

## 2015-09-04 ENCOUNTER — Ambulatory Visit: Payer: 59 | Admitting: Primary Care

## 2015-09-05 ENCOUNTER — Telehealth: Payer: Self-pay | Admitting: Primary Care

## 2015-09-05 NOTE — Telephone Encounter (Signed)
Please have patient reschedule at his convenience. Do not charge no-show fee.

## 2015-09-05 NOTE — Telephone Encounter (Signed)
Patient did not come in for their appointment ON 09/04/15 for 6 mo follow up Please let me know if patient needs to be contacted immediately for follow up or no follow up needed.

## 2015-09-07 NOTE — Telephone Encounter (Signed)
Called to rs appt - mailbox not set up

## 2015-09-14 ENCOUNTER — Encounter: Payer: Self-pay | Admitting: Primary Care

## 2015-09-14 NOTE — Telephone Encounter (Signed)
Sent letter to pt to rs apppt

## 2015-11-12 ENCOUNTER — Other Ambulatory Visit: Payer: Self-pay | Admitting: Primary Care

## 2015-12-13 ENCOUNTER — Other Ambulatory Visit: Payer: Self-pay | Admitting: Primary Care

## 2016-02-22 ENCOUNTER — Ambulatory Visit (INDEPENDENT_AMBULATORY_CARE_PROVIDER_SITE_OTHER): Payer: 59 | Admitting: Family Medicine

## 2016-02-22 ENCOUNTER — Encounter: Payer: Self-pay | Admitting: Family Medicine

## 2016-02-22 VITALS — BP 178/110 | HR 90 | Temp 98.6°F | Resp 18 | Wt 370.6 lb

## 2016-02-22 DIAGNOSIS — J069 Acute upper respiratory infection, unspecified: Secondary | ICD-10-CM

## 2016-02-22 DIAGNOSIS — I10 Essential (primary) hypertension: Secondary | ICD-10-CM | POA: Diagnosis not present

## 2016-02-22 DIAGNOSIS — E119 Type 2 diabetes mellitus without complications: Secondary | ICD-10-CM | POA: Diagnosis not present

## 2016-02-22 MED ORDER — AMOXICILLIN 875 MG PO TABS: 875.0000 mg | ORAL_TABLET | Freq: Two times a day (BID) | ORAL | 0 refills | Status: DC

## 2016-02-22 MED ORDER — AMLODIPINE BESYLATE 10 MG PO TABS: 10.0000 mg | ORAL_TABLET | Freq: Every day | ORAL | 1 refills | Status: DC

## 2016-02-22 NOTE — Progress Notes (Signed)
Pre visit review using our clinic review tool, if applicable. No additional management support is needed unless otherwise documented below in the visit note. 

## 2016-02-22 NOTE — Patient Instructions (Signed)
I have sent in an antibiotic to your pharmacy for your cough Can continue Nyquil and Robitussin DM I have sent in a new prescription for your blood pressure- take once a day, either morning or night Please follow up with Allie Bossier in 2-3 weeks  If you develop shortness of breath go to the ER. If you are not better after finishing the antibiotic or if your symptoms get worse, please let me know.

## 2016-02-22 NOTE — Progress Notes (Addendum)
Subjective:    Patient ID: Antonio Horton, male    DOB: 1965/11/26, 51 y.o.   MRN: [id number]  HPI This is a 51 yo male, accompanied by his 18 yo daughter who is also being seen and his wife. He presents today with cough x 2+ weeks. Cough is non productive, nagging, feels like he can't get phlegm up. Hears some wheezing, worse at night, no SOB, no sore throat, no ear pain, some itching of left ear. No headache or muscle aches. Some increased fatigue. No history of asthma, never has used an inhaler. No fevers or chills. Has been taking Nyquil and Mucinex and Robitussin DM. Mucinex did not seem to loosen cough, good relief of cough with Robitussin DM, slept well with Nyquil last night. Has been around sick family members.   Doesn't always take lisinopril daily, sometimes every other day. He has noticed aching in his legs when he takes lisinopril. Takes metformin daily. Does not take atorvastatin. Wife reports there has been additional stressors lately.   Past Medical History:  Diagnosis Date  . Borderline diabetes   . Hypertension    Past Surgical History:  Procedure Laterality Date  . EYE SURGERY     Corneal dystrophies   Family History  Problem Relation Age of Onset  . Other      adopted   Social History  Substance Use Topics  . Smoking status: Never Smoker  . Smokeless tobacco: Never Used  . Alcohol use 0.0 oz/week     Comment: socially      Review of Systems Per HPI    Objective:   Physical Exam  Constitutional: He is oriented to person, place, and time. He appears well-developed and well-nourished. No distress.  Morbidly obese.   HENT:  Head: Normocephalic and atraumatic.  Right Ear: External ear normal.  Left Ear: External ear normal.  Nose: Nose normal.  Mouth/Throat: Oropharynx is clear and moist.  Eyes: Conjunctivae are normal.  Neck: Neck supple.  Cardiovascular: Normal rate, regular rhythm and normal heart sounds.   Pulmonary/Chest: Effort normal and  breath sounds normal.  Neurological: He is alert and oriented to person, place, and time.  Skin: Skin is warm and dry. He is not diaphoretic.  Psychiatric: He has a normal mood and affect. His behavior is normal. Judgment and thought content normal.  Vitals reviewed.     BP (!) 178/110 (BP Location: Left Arm, Patient Position: Sitting, Cuff Size: Normal)   Pulse 90   Temp 98.6 F (37 C) (Oral)   Resp 18   Wt (!) 370 lb 9.6 oz (168.1 kg)   SpO2 96%   BMI 48.89 kg/m  Wt Readings from Last 3 Encounters:  02/22/16 (!) 370 lb 9.6 oz (168.1 kg)  03/07/15 (!) 359 lb 8 oz (163.1 kg)  11/23/14 (!) 361 lb (163.7 kg)  REcheck bp- 160/108  BP Readings from Last 3 Encounters:  02/22/16 (!) 178/110  03/07/15 132/90  11/23/14 118/76       Assessment & Plan:  1. Upper respiratory tract infection, unspecified type - given prolonged course and DM type 2, will cover for bacterial infection - continue Robitussin DM, Nyquil prn - RTC/ED precautions reviewed - amoxicillin (AMOXIL) 875 MG tablet; Take 1 tablet (875 mg total) by mouth 2 (two) times daily.  Dispense: 14 tablet; Refill: 0  2. Essential hypertension - discussed importance of blood pressure control especially in setting of DM - patient agreeable to starting new medication - will check BP  at home - amLODipine (NORVASC) 10 MG tablet; Take 1 tablet (10 mg total) by mouth daily.  Dispense: 30 tablet; Refill: 1 - due to follow up with PCP, Allie Bossier, NP, patient agreed to follow up appointment in 2-3 weeks  3. Type 2 diabetes mellitus without complication, without long-term current use of insulin ([place]) - has been well controlled in past, he is due follow up, will see PCP   Clarene Reamer, FNP-BC  [facility]  02/22/2016 2:03 PM

## 2016-06-11 DIAGNOSIS — H6123 Impacted cerumen, bilateral: Secondary | ICD-10-CM | POA: Diagnosis not present

## 2017-11-30 DIAGNOSIS — H18833 Recurrent erosion of cornea, bilateral: Secondary | ICD-10-CM | POA: Diagnosis not present

## 2017-11-30 DIAGNOSIS — H1851 Endothelial corneal dystrophy: Secondary | ICD-10-CM | POA: Diagnosis not present

## 2018-01-06 DIAGNOSIS — H1851 Endothelial corneal dystrophy: Secondary | ICD-10-CM | POA: Diagnosis not present

## 2018-01-06 DIAGNOSIS — H18832 Recurrent erosion of cornea, left eye: Secondary | ICD-10-CM | POA: Diagnosis not present

## 2018-03-02 DIAGNOSIS — R42 Dizziness and giddiness: Secondary | ICD-10-CM | POA: Diagnosis not present

## 2018-03-02 DIAGNOSIS — I252 Old myocardial infarction: Secondary | ICD-10-CM | POA: Diagnosis not present

## 2018-03-10 DIAGNOSIS — H18832 Recurrent erosion of cornea, left eye: Secondary | ICD-10-CM | POA: Diagnosis not present

## 2018-03-10 DIAGNOSIS — H1851 Endothelial corneal dystrophy: Secondary | ICD-10-CM | POA: Diagnosis not present

## 2018-12-24 ENCOUNTER — Telehealth: Payer: Self-pay | Admitting: Primary Care

## 2018-12-24 NOTE — Telephone Encounter (Signed)
Patient's wife called today to schedule appointment Patient was last seen by Tor Netters in 2018 for sick visit.   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 Wife is very concerned because he is not on any medication or insulin to help with this.   Patient is wanting to be worked in next week but can only do Thursday or Friday. And possible Wednesday if they need to. Patient has to stay on his back at least till Tuesday   What do you suggest ?

## 2018-12-24 NOTE — Telephone Encounter (Signed)
Please schedule him for an office visit ASAP. Okay to do virtual visit on Monday next week, then have him come for labs when able. If he prefers to come in person then have him come in when able.  If he decides to complete a virtual visit then best time is 9:40am or 3 pm on Monday December 7th.

## 2018-12-24 NOTE — Telephone Encounter (Signed)
Updated Wife's call back number [phone]

## 2018-12-27 ENCOUNTER — Encounter: Payer: Self-pay | Admitting: Primary Care

## 2018-12-27 ENCOUNTER — Ambulatory Visit (INDEPENDENT_AMBULATORY_CARE_PROVIDER_SITE_OTHER): Payer: 59 | Admitting: Primary Care

## 2018-12-27 ENCOUNTER — Other Ambulatory Visit: Payer: Self-pay

## 2018-12-27 VITALS — BP 141/111 | HR 90

## 2018-12-27 DIAGNOSIS — I10 Essential (primary) hypertension: Secondary | ICD-10-CM | POA: Diagnosis not present

## 2018-12-27 DIAGNOSIS — E119 Type 2 diabetes mellitus without complications: Secondary | ICD-10-CM

## 2018-12-27 DIAGNOSIS — H18519 Endothelial corneal dystrophy, unspecified eye: Secondary | ICD-10-CM

## 2018-12-27 MED ORDER — LANTUS SOLOSTAR 100 UNIT/ML ~~LOC~~ SOPN: 8.0000 [IU] | PEN_INJECTOR | Freq: Every day | SUBCUTANEOUS | 1 refills | Status: DC

## 2018-12-27 MED ORDER — PEN NEEDLES 31G X 6 MM MISC: 0 refills | Status: DC

## 2018-12-27 MED ORDER — LISINOPRIL-HYDROCHLOROTHIAZIDE 10-12.5 MG PO TABS: 1.0000 | ORAL_TABLET | Freq: Every day | ORAL | 0 refills | Status: DC

## 2018-12-27 MED ORDER — METFORMIN HCL ER 500 MG PO TB24: 500.0000 mg | ORAL_TABLET | Freq: Every day | ORAL | 0 refills | Status: DC

## 2018-12-27 MED ORDER — FREESTYLE LIBRE 14 DAY READER DEVI: 1.0000 | Freq: Three times a day (TID) | 0 refills | Status: DC | PRN

## 2018-12-27 MED ORDER — FREESTYLE LIBRE 14 DAY SENSOR MISC: 1.0000 | Freq: Three times a day (TID) | 5 refills | Status: DC | PRN

## 2018-12-27 NOTE — Patient Instructions (Signed)
Start lisinopril-hydrochlorothiazide 10-12.5 mg once daily for blood pressure.  Start monitoring your blood pressure daily, around the same time of day, for the next 2-3 weeks.  Ensure that you have rested for 30 minutes prior to checking your blood pressure. Record your readings and bring them to your next visit.  Start Metformin ER 500 mg once every morning with breakfast for diabetes.  Start Lantus insulin and inject 8 units into the skin every evening at bedtime. Rotate sites.  Start checking your blood sugar levels.  Appropriate times to check your blood sugar levels are:  -Before any meal (breakfast, lunch, dinner) -Two hours after any meal (breakfast, lunch, dinner) -Bedtime  Record your readings and notify me if you continue to consistently run at or above 200   Schedule a follow up visit for 2 weeks for blood pressure and blood sugar check.  It was a pleasure to see you today! Allie Bossier, NP-C

## 2018-12-27 NOTE — Assessment & Plan Note (Signed)
Off of meds for three years, also not checking BP.  BP above goal from pre-op cornea surgery based off of care everywhere, also above goal at home during our visit.  Rx for lisinopril-HCTZ 10-12.5 mg sent to pharmacy. He will start checking BP at home.  Follow up in 2 weeks with BP logs and for BMP.

## 2018-12-27 NOTE — Assessment & Plan Note (Signed)
Off meds since 2017, not checking glucose readings. Recent A1C of 12 which was viewed in [facility].  Rx for [employer] sensor and device sent to pharmacy. Discussed best time to check glucose readings.  Rx for Metformin ER 500 mg and Lantus 8 units sent to pharmacy, discussed both with patient.  We will plan to see him back for follow up in 2 weeks with logs.

## 2018-12-27 NOTE — Assessment & Plan Note (Signed)
Following with ophthalmology and underwent recent surgical intervention.

## 2018-12-27 NOTE — Progress Notes (Signed)
Subjective:    Patient ID: Antonio Horton, male    DOB: [birth year], 53 y.o.   MRN: [id number]  HPI  Virtual Visit via Video Note  I connected with Antonio Horton on 12/27/18 at  3:00 PM EST by a video enabled telemedicine application and verified that I am speaking with the correct person using two identifiers.  Location: Patient: Home Provider: Office   I discussed the limitations of evaluation and management by telemedicine and the availability of in person appointments. The patient expressed understanding and agreed to proceed.  History of Present Illness:  Antonio Horton is a 53 year old male who presents today to re-establish care and a chief complaint of hyperglycemia. He has not been seen by me since February 2017.  1) Essential Hypertension: Previously managed on Amlodipine 10 mg for which he stopped taking in 2017 as he "ran out". He does have a home BP cuff but is not checking. He was told that his last BP was "high" during pre-op for cornea surgery, and according to care everywhere it was 160/98. He had a reading of 121/78 which was just after his surgery on 12/24/18.  His blood pressure at home right now is 141/111, HR of 90. He denies dizziness, chest pain, shortness of breath.   BP Readings from Last 3 Encounters:  12/27/18 (!) 141/111  02/22/16 (!) 178/110  03/07/15 132/90     2) Type 2 Diabetes: Previously managed on Metformin 500 mg BID for which he stopped taking in 2017 as he didn't like the way it made him feel. A1C of 6.4 in May of 2017, has not been seen in our clinic since February 2017.   He was visiting his ophthalmologist last week who rechecked his A1C which was 12.0 with a glucose reading of mid to high 300's, was provided with insulin prior to surgery for goal glucose level of <300.  He does not check his blood sugars because he is afraid of needles. His wife is requesting the [employer].   Observations/Objective:  Alert and oriented.  Appears well, not sickly. No distress. Speaking in complete sentences.   Assessment and Plan:  See problem based charting  Follow Up Instructions:  Start lisinopril-hydrochlorothiazide 10-12.5 mg once daily for blood pressure.  Start monitoring your blood pressure daily, around the same time of day, for the next 2-3 weeks.  Ensure that you have rested for 30 minutes prior to checking your blood pressure. Record your readings and bring them to your next visit.  Start Metformin ER 500 mg once every morning with breakfast for diabetes.  Start Lantus insulin and inject 8 units into the skin every evening at bedtime. Rotate sites.  Start checking your blood sugar levels.  Appropriate times to check your blood sugar levels are:  -Before any meal (breakfast, lunch, dinner) -Two hours after any meal (breakfast, lunch, dinner) -Bedtime  Record your readings and notify me if you continue to consistently run at or above 200   Schedule a follow up visit for 2 weeks for blood pressure and blood sugar check.  It was a pleasure to see you today! Antonio Bossier, NP-C    I discussed the assessment and treatment plan with the patient. The patient was provided an opportunity to ask questions and all were answered. The patient agreed with the plan and demonstrated an understanding of the instructions.   The patient was advised to call back or seek an in-person evaluation if the symptoms worsen or if the  condition fails to improve as anticipated.    Antonio Koch, NP  This visit occurred during the SARS-CoV-2 public health emergency.  Safety protocols were in place, including screening questions prior to the visit, additional usage of staff PPE, and extensive cleaning of exam room while observing appropriate contact time as indicated for disinfecting solutions.     Review of Systems  Constitutional: Negative for unexpected weight change.  Respiratory: Negative for shortness of breath.    Cardiovascular: Negative for chest pain.  Neurological: Negative for dizziness and headaches.       Past Medical History:  Diagnosis Date  . Borderline diabetes   . Hypertension      Social History   Socioeconomic History  . Marital status: Married    Spouse name: Not on file  . Number of children: Not on file  . Years of education: Not on file  . Highest education level: Not on file  Occupational History  . Not on file  Social Needs  . Financial resource strain: Not on file  . Food insecurity    Worry: Not on file    Inability: Not on file  . Transportation needs    Medical: Not on file    Non-medical: Not on file  Tobacco Use  . Smoking status: Never Smoker  . Smokeless tobacco: Never Used  Substance and Sexual Activity  . Alcohol use: Yes    Alcohol/week: 0.0 standard drinks    Comment: socially  . Drug use: No  . Sexual activity: Not on file  Lifestyle  . Physical activity    Days per week: Not on file    Minutes per session: Not on file  . Stress: Not on file  Relationships  . Social Herbalist on phone: Not on file    Gets together: Not on file    Attends religious service: Not on file    Active member of club or organization: Not on file    Attends meetings of clubs or organizations: Not on file    Relationship status: Not on file  . Intimate partner violence    Fear of current or ex partner: Not on file    Emotionally abused: Not on file    Physically abused: Not on file    Forced sexual activity: Not on file  Other Topics Concern  . Not on file  Social History Narrative   Married.   2 children.   Works as a Administrator, sports.   Enjoys reading.     Past Surgical History:  Procedure Laterality Date  . EYE SURGERY     Corneal dystrophies    Family History  Problem Relation Age of Onset  . Other Unknown        adopted    No Known Allergies  No current outpatient medications on file prior to visit.   No current  facility-administered medications on file prior to visit.     BP (!) 141/111   Pulse 90    Objective:   Physical Exam  Constitutional: He is oriented to person, place, and time. He appears well-nourished.  Respiratory: Effort normal.  Neurological: He is alert and oriented to person, place, and time.  Psychiatric: He has a normal mood and affect.           Assessment & Plan:

## 2018-12-28 MED ORDER — INSULIN PEN NEEDLE 31G X 5 MM MISC: 5 refills | Status: DC

## 2018-12-28 NOTE — Addendum Note (Signed)
Addended by: Jacqualin Combes on: 12/28/2018 09:52 AM   Modules accepted: Orders

## 2019-01-23 ENCOUNTER — Other Ambulatory Visit: Payer: Self-pay | Admitting: Primary Care

## 2019-01-23 DIAGNOSIS — I10 Essential (primary) hypertension: Secondary | ICD-10-CM

## 2019-01-24 ENCOUNTER — Encounter: Payer: Self-pay | Admitting: Primary Care

## 2019-01-24 ENCOUNTER — Other Ambulatory Visit: Payer: Self-pay

## 2019-01-24 ENCOUNTER — Ambulatory Visit (INDEPENDENT_AMBULATORY_CARE_PROVIDER_SITE_OTHER): Payer: 59 | Admitting: Primary Care

## 2019-01-24 VITALS — BP 124/82 | HR 88 | Temp 96.7°F | Wt 337.5 lb

## 2019-01-24 DIAGNOSIS — E7800 Pure hypercholesterolemia, unspecified: Secondary | ICD-10-CM | POA: Diagnosis not present

## 2019-01-24 DIAGNOSIS — Z23 Encounter for immunization: Secondary | ICD-10-CM | POA: Diagnosis not present

## 2019-01-24 DIAGNOSIS — I10 Essential (primary) hypertension: Secondary | ICD-10-CM | POA: Diagnosis not present

## 2019-01-24 DIAGNOSIS — E119 Type 2 diabetes mellitus without complications: Secondary | ICD-10-CM | POA: Diagnosis not present

## 2019-01-24 MED ORDER — METFORMIN HCL ER 500 MG PO TB24: 1000.0000 mg | ORAL_TABLET | Freq: Every day | ORAL | 3 refills | Status: DC

## 2019-01-24 MED ORDER — FREESTYLE LIBRE 14 DAY SENSOR MISC: 1.0000 | Freq: Three times a day (TID) | 5 refills | Status: DC | PRN

## 2019-01-24 NOTE — Patient Instructions (Addendum)
We've increased the dose of your metformin to 1000 mg, take 2 tablets once daily with breakfast.  Continue to check your blood sugars daily.   Stop by the lab prior to leaving today. I will notify you of your results once received.   Schedule a follow up visit for on or after March 3rd, 2021 for diabetes check.  It was a pleasure to see you today! Congratulations on your weight loss, AWESOME!

## 2019-01-24 NOTE — Assessment & Plan Note (Addendum)
Seems to be significant improvement given glucose readings. Commended him on lifestyle changes.  Discontinue Lantus as he never started. Increase Metformin ER to 1000 mg daily.  Managed on ACE. Pneumonia vaccination provided.  Lipid panel pending, he is open to statin therapy. Foot exam next visit.  Follow up in 2 months for A1C.

## 2019-01-24 NOTE — Assessment & Plan Note (Signed)
Improved in the office today on lisinopril-HCTZ. BMP pending, if unremarkable then will refill medication.

## 2019-01-24 NOTE — Assessment & Plan Note (Signed)
Repeat lipid panel pending. He is open to statin therapy.

## 2019-01-24 NOTE — Progress Notes (Signed)
Subjective:    Patient ID: Antonio Horton, male    DOB: [birth year], 54 y.o.   MRN: [id number]  HPI  This visit occurred during the SARS-CoV-2 public health emergency.  Safety protocols were in place, including screening questions prior to the visit, additional usage of staff PPE, and extensive cleaning of exam room while observing appropriate contact time as indicated for disinfecting solutions.   Antonio Horton is a 54 year old male with a history of hypertension, Type 2 Diabetes who presents today for follow up.  1) Type 2 Diabetes:   Current medications include: Metformin ER 500 mg, Lantus 8 units HS. He never started Lantus as he wanted to work on diet and exercise.  He is checking his blood glucose 4-5 times daily and is getting readings of 140's-150's.  He's significantly changed his diet by cutting out all sugar, eating lean meats, raw vegetables.   Wt Readings from Last 3 Encounters:  01/24/19 (!) 337 lb 8 oz (153.1 kg)  02/22/16 (!) 370 lb 9.6 oz (168.1 kg)  03/07/15 (!) 359 lb 8 oz (163.1 kg)     Last A1C: 12 in December 2020 Last Eye Exam: Following with ophthalmology  Last Foot Exam: Next visit Pneumonia Vaccination: Pneumovax due ACE/ARB: Lisinopril  Statin: None, lipid panel pending  2) Essential Hypertension: Currently managed on lisinopril-HCTZ 10-12.5 mg which was re-initiated in early December 2020 after noted elevated readings from [place] per his ophthalmologist. He had not taken medications since 2017 so given elevated readings we re-initiated treatment.  Since his last visit he's checking his readings at home and is getting readings of 120's-130's/70's-80's. He denies chest pain, dizziness, cough.  BP Readings from Last 3 Encounters:  01/24/19 124/82  12/27/18 (!) 141/111  02/22/16 (!) 178/110      Review of Systems  Respiratory: Negative for cough and shortness of breath.   Cardiovascular: Negative for chest pain.  Neurological:  Negative for dizziness, numbness and headaches.       Past Medical History:  Diagnosis Date  . Borderline diabetes   . Hypertension      Social History   Socioeconomic History  . Marital status: Married    Spouse name: Not on file  . Number of children: Not on file  . Years of education: Not on file  . Highest education level: Not on file  Occupational History  . Not on file  Tobacco Use  . Smoking status: Never Smoker  . Smokeless tobacco: Never Used  Substance and Sexual Activity  . Alcohol use: Yes    Alcohol/week: 0.0 standard drinks    Comment: socially  . Drug use: No  . Sexual activity: Not on file  Other Topics Concern  . Not on file  Social History Narrative   Married.   2 children.   Works as a Administrator, sports.   Enjoys reading.    Social Determinants of Health   Financial Resource Strain:   . Difficulty of Paying Living Expenses: Not on file  Food Insecurity:   . Worried About Charity fundraiser in the Last Year: Not on file  . Ran Out of Food in the Last Year: Not on file  Transportation Needs:   . Lack of Transportation (Medical): Not on file  . Lack of Transportation (Non-Medical): Not on file  Physical Activity:   . Days of Exercise per Week: Not on file  . Minutes of Exercise per Session: Not on file  Stress:   .  Feeling of Stress : Not on file  Social Connections:   . Frequency of Communication with Friends and Family: Not on file  . Frequency of Social Gatherings with Friends and Family: Not on file  . Attends Religious Services: Not on file  . Active Member of Clubs or Organizations: Not on file  . Attends Archivist Meetings: Not on file  . Marital Status: Not on file  Intimate Partner Violence:   . Fear of Current or Ex-Partner: Not on file  . Emotionally Abused: Not on file  . Physically Abused: Not on file  . Sexually Abused: Not on file    Past Surgical History:  Procedure Laterality Date  . EYE SURGERY      Corneal dystrophies    Family History  Problem Relation Age of Onset  . Other Unknown        adopted    No Known Allergies  Current Outpatient Medications on File Prior to Visit  Medication Sig Dispense Refill  . Continuous Blood Gluc Receiver (FREESTYLE LIBRE 14 DAY READER) DEVI 1 Device by Does not apply route 3 (three) times daily as needed. 1 each 0  . lisinopril-hydrochlorothiazide (ZESTORETIC) 10-12.5 MG tablet Take 1 tablet by mouth daily. For blood pressure. 30 tablet 0  . metFORMIN (GLUCOPHAGE-XR) 500 MG 24 hr tablet Take 1 tablet (500 mg total) by mouth daily with breakfast. For diabetes. 90 tablet 0   No current facility-administered medications on file prior to visit.    BP 124/82   Pulse 88   Temp (!) 96.7 F (35.9 C) (Temporal)   Wt (!) 337 lb 8 oz (153.1 kg)   SpO2 94%   BMI 44.53 kg/m    Objective:   Physical Exam  Constitutional: He appears well-nourished.  Cardiovascular: Normal rate and regular rhythm.  Respiratory: Effort normal and breath sounds normal.  Musculoskeletal:     Cervical back: Neck supple.  Skin: Skin is warm and dry.  Psychiatric: He has a normal mood and affect.           Assessment & Plan:

## 2019-01-25 ENCOUNTER — Other Ambulatory Visit: Payer: Self-pay | Admitting: Primary Care

## 2019-01-25 DIAGNOSIS — I10 Essential (primary) hypertension: Secondary | ICD-10-CM

## 2019-01-25 LAB — COMPREHENSIVE METABOLIC PANEL
ALT: 30 U/L (ref 0–53)
AST: 25 U/L (ref 0–37)
Albumin: 4.5 g/dL (ref 3.5–5.2)
Alkaline Phosphatase: 51 U/L (ref 39–117)
BUN: 18 mg/dL (ref 6–23)
CO2: 30 mEq/L (ref 19–32)
Calcium: 10.6 mg/dL — ABNORMAL HIGH (ref 8.4–10.5)
Chloride: 97 mEq/L (ref 96–112)
Creatinine, Ser: 1.04 mg/dL (ref 0.40–1.50)
GFR: 74.98 mL/min (ref >60.00)
Glucose, Bld: 127 mg/dL — ABNORMAL HIGH (ref 70–99)
Potassium: 4.3 mEq/L (ref 3.5–5.1)
Sodium: 136 mEq/L (ref 135–145)
Total Bilirubin: 0.6 mg/dL (ref 0.2–1.2)
Total Protein: 8 g/dL (ref 6.0–8.3)

## 2019-01-25 LAB — LIPID PANEL
Cholesterol: 201 mg/dL — ABNORMAL HIGH (ref 0–200)
HDL: 39.1 mg/dL (ref >39.00)
LDL Cholesterol: 126 mg/dL — ABNORMAL HIGH (ref 0–99)
NonHDL: 161.54
Total CHOL/HDL Ratio: 5
Triglycerides: 180 mg/dL — ABNORMAL HIGH (ref 0.0–149.0)
VLDL: 36 mg/dL (ref 0.0–40.0)

## 2019-01-25 MED ORDER — LISINOPRIL-HYDROCHLOROTHIAZIDE 10-12.5 MG PO TABS: 1.0000 | ORAL_TABLET | Freq: Every day | ORAL | 3 refills | Status: DC

## 2019-01-25 NOTE — Telephone Encounter (Signed)
Last prescribed on 12/27/2018 . Last appointment on 01/24/2019. Next future appointment on 03/25/2019

## 2019-01-25 NOTE — Addendum Note (Signed)
Addended by: Jacqualin Combes on: 01/25/2019 07:32 AM   Modules accepted: Orders

## 2019-01-25 NOTE — Telephone Encounter (Signed)
Duplicate. Already refilled.

## 2019-01-27 ENCOUNTER — Telehealth: Payer: Self-pay | Admitting: Primary Care

## 2019-01-27 NOTE — Telephone Encounter (Signed)
Pt returned your call.  

## 2019-01-28 ENCOUNTER — Other Ambulatory Visit: Payer: Self-pay | Admitting: Primary Care

## 2019-01-28 DIAGNOSIS — E7800 Pure hypercholesterolemia, unspecified: Secondary | ICD-10-CM

## 2019-01-28 MED ORDER — ROSUVASTATIN CALCIUM 5 MG PO TABS: 5.0000 mg | ORAL_TABLET | Freq: Every evening | ORAL | 3 refills | Status: DC

## 2019-01-28 NOTE — Telephone Encounter (Signed)
Addressed in result note.  

## 2019-03-25 ENCOUNTER — Other Ambulatory Visit: Payer: Self-pay

## 2019-03-25 ENCOUNTER — Encounter: Payer: Self-pay | Admitting: Primary Care

## 2019-03-25 ENCOUNTER — Ambulatory Visit (INDEPENDENT_AMBULATORY_CARE_PROVIDER_SITE_OTHER): Payer: 59 | Admitting: Primary Care

## 2019-03-25 VITALS — BP 126/82 | HR 80 | Temp 97.9°F | Ht 73.0 in | Wt 321.5 lb

## 2019-03-25 DIAGNOSIS — E7800 Pure hypercholesterolemia, unspecified: Secondary | ICD-10-CM | POA: Diagnosis not present

## 2019-03-25 DIAGNOSIS — E119 Type 2 diabetes mellitus without complications: Secondary | ICD-10-CM

## 2019-03-25 LAB — POCT GLYCOSYLATED HEMOGLOBIN (HGB A1C): Hemoglobin A1C: 6.5 % — AB (ref 4.0–5.6)

## 2019-03-25 NOTE — Progress Notes (Signed)
Subjective:    Patient ID: Antonio Horton, male    DOB: 03-Sep-1965, 54 y.o.   MRN: [id number]  HPI  This visit occurred during the SARS-CoV-2 public health emergency.  Safety protocols were in place, including screening questions prior to the visit, additional usage of staff PPE, and extensive cleaning of exam room while observing appropriate contact time as indicated for disinfecting solutions.   Antonio Horton is a 55 year old male with a history of hypertension, OSA, type 2 diabetes, hyperlipidemia, fuch's corneal dystrophy who presents today for follow up.  1) Hyperlipidemia: LDL of 126 on labs from January 2021. Given history of type 2 diabetes and hypertension we recommended rosuvastatin 5 mg. He has been taking this daily. Due for repeat lipid check today.  He never started his Crestor as he wanted to work on diet, also doesn't want to take a lot of medications. Since his last visit he's lost 16 pounds through diet alone. He is not exercising.   2) Type 2 Diabetes:  Current medications include: Metformin XR 1000 mg daily.  He is checking his blood glucose 4 times daily and is getting readings of:  AM fasting: 110's Mid day: low 100's Evening: 110's  Last A1C: 12.0 in December 2020 Last Eye Exam: Follows regularly.  Last Foot Exam: Due today Pneumonia Vaccination: Completed in 2021 ACE/ARB: Lisinopril  Statin: Crestor  BP Readings from Last 3 Encounters:  03/25/19 126/82  01/24/19 124/82  12/27/18 (!) 141/111   Wt Readings from Last 3 Encounters:  03/25/19 (!) 321 lb 8 oz (145.8 kg)  01/24/19 (!) 337 lb 8 oz (153.1 kg)  02/22/16 (!) 370 lb 9.6 oz (168.1 kg)      Review of Systems  Eyes: Negative for visual disturbance.  Respiratory: Negative for shortness of breath.   Cardiovascular: Negative for chest pain.  Neurological: Negative for dizziness and headaches.       Past Medical History:  Diagnosis Date  . Borderline diabetes   . Hypertension        Social History   Socioeconomic History  . Marital status: Married    Spouse name: Not on file  . Number of children: Not on file  . Years of education: Not on file  . Highest education level: Not on file  Occupational History  . Not on file  Tobacco Use  . Smoking status: Never Smoker  . Smokeless tobacco: Never Used  Substance and Sexual Activity  . Alcohol use: Yes    Alcohol/week: 0.0 standard drinks    Comment: socially  . Drug use: No  . Sexual activity: Not on file  Other Topics Concern  . Not on file  Social History Narrative   Married.   2 children.   Works as a Administrator, sports.   Enjoys reading.    Social Determinants of Health   Financial Resource Strain:   . Difficulty of Paying Living Expenses: Not on file  Food Insecurity:   . Worried About Charity fundraiser in the Last Year: Not on file  . Ran Out of Food in the Last Year: Not on file  Transportation Needs:   . Lack of Transportation (Medical): Not on file  . Lack of Transportation (Non-Medical): Not on file  Physical Activity:   . Days of Exercise per Week: Not on file  . Minutes of Exercise per Session: Not on file  Stress:   . Feeling of Stress : Not on file  Social Connections:   .  Frequency of Communication with Friends and Family: Not on file  . Frequency of Social Gatherings with Friends and Family: Not on file  . Attends Religious Services: Not on file  . Active Member of Clubs or Organizations: Not on file  . Attends Archivist Meetings: Not on file  . Marital Status: Not on file  Intimate Partner Violence:   . Fear of Current or Ex-Partner: Not on file  . Emotionally Abused: Not on file  . Physically Abused: Not on file  . Sexually Abused: Not on file    Past Surgical History:  Procedure Laterality Date  . EYE SURGERY     Corneal dystrophies    Family History  Problem Relation Age of Onset  . Other Unknown        adopted    No Known Allergies  Current  Outpatient Medications on File Prior to Visit  Medication Sig Dispense Refill  . Continuous Blood Gluc Receiver (FREESTYLE LIBRE 14 DAY READER) DEVI 1 Device by Does not apply route 3 (three) times daily as needed. 1 each 0  . Continuous Blood Gluc Sensor (FREESTYLE LIBRE 14 DAY SENSOR) MISC 1 Device by Does not apply route 3 (three) times daily as needed. 2 each 5  . lisinopril-hydrochlorothiazide (ZESTORETIC) 10-12.5 MG tablet Take 1 tablet by mouth daily. For blood pressure. 90 tablet 3  . metFORMIN (GLUCOPHAGE-XR) 500 MG 24 hr tablet Take 2 tablets (1,000 mg total) by mouth daily with breakfast. For diabetes. 180 tablet 3  . rosuvastatin (CRESTOR) 5 MG tablet Take 1 tablet (5 mg total) by mouth every evening. For cholesterol. 90 tablet 3   No current facility-administered medications on file prior to visit.    BP 126/82   Pulse 80   Temp 97.9 F (36.6 C) (Temporal)   Ht 6\' 1"  (1.854 m)   Wt (!) 321 lb 8 oz (145.8 kg)   BMI 42.42 kg/m    Objective:   Physical Exam  Constitutional: He appears well-nourished.  Cardiovascular: Normal rate and regular rhythm.  Respiratory: Effort normal and breath sounds normal.  Musculoskeletal:     Cervical back: Neck supple.  Skin: Skin is warm and dry.  Psychiatric: He has a normal mood and affect.           Assessment & Plan:

## 2019-03-25 NOTE — Assessment & Plan Note (Signed)
Never took Crestor as prescribed. Repeat lipids pending.  Discussed the risk of ASCVD with uncontrolled lipids in the setting of diabetes.

## 2019-03-25 NOTE — Patient Instructions (Signed)
Stop by the lab prior to leaving today. I will notify you of your results once received.   Continue to work on a healthy diet, congratulations on your weight loss!!  Please schedule a follow up appointment in 6 months for a complete physical.  It was a pleasure to see you today!

## 2019-03-25 NOTE — Assessment & Plan Note (Signed)
A1C today of 6.5 which is a huge improvement compared to 12.0 in December 2020. I commended him on weight loss, dietary changes. I encouraged him to start exercising.  Managed on ACE. He never took Crestor as prescribed, repeat lipids pending. Discussed the risk for ASCVD. Pneumonia vaccination UTD. Foot exam today.  Follow up in 6 months.

## 2019-03-26 LAB — LIPID PANEL
Cholesterol: 177 mg/dL (ref <200)
HDL: 35 mg/dL — ABNORMAL LOW (ref >=40)
LDL Cholesterol (Calc): 117 mg/dL (calc) — ABNORMAL HIGH
Non-HDL Cholesterol (Calc): 142 mg/dL (calc) — ABNORMAL HIGH (ref <130)
Total CHOL/HDL Ratio: 5.1 (calc) — ABNORMAL HIGH (ref <5.0)
Triglycerides: 134 mg/dL (ref <150)

## 2019-08-04 ENCOUNTER — Other Ambulatory Visit: Payer: Self-pay | Admitting: Primary Care

## 2019-08-04 DIAGNOSIS — Z125 Encounter for screening for malignant neoplasm of prostate: Secondary | ICD-10-CM

## 2019-08-04 DIAGNOSIS — Z1159 Encounter for screening for other viral diseases: Secondary | ICD-10-CM

## 2019-08-04 DIAGNOSIS — E7800 Pure hypercholesterolemia, unspecified: Secondary | ICD-10-CM

## 2019-08-04 DIAGNOSIS — I10 Essential (primary) hypertension: Secondary | ICD-10-CM

## 2019-08-04 DIAGNOSIS — Z114 Encounter for screening for human immunodeficiency virus [HIV]: Secondary | ICD-10-CM

## 2019-08-04 DIAGNOSIS — E119 Type 2 diabetes mellitus without complications: Secondary | ICD-10-CM

## 2019-08-08 DIAGNOSIS — M7501 Adhesive capsulitis of right shoulder: Secondary | ICD-10-CM | POA: Insufficient documentation

## 2019-08-08 HISTORY — DX: Adhesive capsulitis of right shoulder: M75.01

## 2019-09-07 LAB — HM DIABETES EYE EXAM

## 2019-09-20 ENCOUNTER — Other Ambulatory Visit: Payer: Self-pay

## 2019-09-20 ENCOUNTER — Other Ambulatory Visit (INDEPENDENT_AMBULATORY_CARE_PROVIDER_SITE_OTHER): Payer: 59

## 2019-09-20 DIAGNOSIS — E7800 Pure hypercholesterolemia, unspecified: Secondary | ICD-10-CM | POA: Diagnosis not present

## 2019-09-20 DIAGNOSIS — Z114 Encounter for screening for human immunodeficiency virus [HIV]: Secondary | ICD-10-CM

## 2019-09-20 DIAGNOSIS — I10 Essential (primary) hypertension: Secondary | ICD-10-CM | POA: Diagnosis not present

## 2019-09-20 DIAGNOSIS — Z125 Encounter for screening for malignant neoplasm of prostate: Secondary | ICD-10-CM | POA: Diagnosis not present

## 2019-09-20 DIAGNOSIS — Z1159 Encounter for screening for other viral diseases: Secondary | ICD-10-CM

## 2019-09-20 DIAGNOSIS — E119 Type 2 diabetes mellitus without complications: Secondary | ICD-10-CM

## 2019-09-20 LAB — COMPREHENSIVE METABOLIC PANEL
ALT: 16 U/L (ref 0–53)
AST: 15 U/L (ref 0–37)
Albumin: 4.2 g/dL (ref 3.5–5.2)
Alkaline Phosphatase: 44 U/L (ref 39–117)
BUN: 16 mg/dL (ref 6–23)
CO2: 31 mEq/L (ref 19–32)
Calcium: 9.6 mg/dL (ref 8.4–10.5)
Chloride: 101 mEq/L (ref 96–112)
Creatinine, Ser: 0.96 mg/dL (ref 0.40–1.50)
GFR: 82.02 mL/min (ref >60.00)
Glucose, Bld: 116 mg/dL — ABNORMAL HIGH (ref 70–99)
Potassium: 4.4 mEq/L (ref 3.5–5.1)
Sodium: 139 mEq/L (ref 135–145)
Total Bilirubin: 0.6 mg/dL (ref 0.2–1.2)
Total Protein: 6.8 g/dL (ref 6.0–8.3)

## 2019-09-20 LAB — LIPID PANEL
Cholesterol: 113 mg/dL (ref 0–200)
HDL: 36.8 mg/dL — ABNORMAL LOW (ref >39.00)
LDL Cholesterol: 49 mg/dL (ref 0–99)
NonHDL: 76.2
Total CHOL/HDL Ratio: 3
Triglycerides: 134 mg/dL (ref 0.0–149.0)
VLDL: 26.8 mg/dL (ref 0.0–40.0)

## 2019-09-20 LAB — PSA: PSA: 0.62 ng/mL (ref 0.10–4.00)

## 2019-09-20 LAB — CBC
HCT: 44.2 % (ref 39.0–52.0)
Hemoglobin: 14.9 g/dL (ref 13.0–17.0)
MCHC: 33.7 g/dL (ref 30.0–36.0)
MCV: 89.9 fl (ref 78.0–100.0)
Platelets: 228 10*3/uL (ref 150.0–400.0)
RBC: 4.92 Mil/uL (ref 4.22–5.81)
RDW: 13.9 % (ref 11.5–15.5)
WBC: 7.7 10*3/uL (ref 4.0–10.5)

## 2019-09-20 LAB — HEMOGLOBIN A1C: Hgb A1c MFr Bld: 6.1 % (ref 4.6–6.5)

## 2019-09-22 LAB — HIV ANTIBODY (ROUTINE TESTING W REFLEX): HIV 1&2 Ab, 4th Generation: NONREACTIVE

## 2019-09-22 LAB — HEPATITIS C ANTIBODY
Hepatitis C Ab: NONREACTIVE
SIGNAL TO CUT-OFF: 0.02 (ref <1.00)

## 2019-09-27 ENCOUNTER — Encounter: Payer: 59 | Admitting: Primary Care

## 2019-09-27 ENCOUNTER — Encounter: Payer: Self-pay | Admitting: Primary Care

## 2019-09-27 ENCOUNTER — Other Ambulatory Visit: Payer: Self-pay

## 2019-09-27 ENCOUNTER — Ambulatory Visit (INDEPENDENT_AMBULATORY_CARE_PROVIDER_SITE_OTHER): Payer: 59 | Admitting: Primary Care

## 2019-09-27 VITALS — BP 112/80 | HR 76 | Ht 73.0 in | Wt 292.0 lb

## 2019-09-27 DIAGNOSIS — Z23 Encounter for immunization: Secondary | ICD-10-CM

## 2019-09-27 DIAGNOSIS — Z0000 Encounter for general adult medical examination without abnormal findings: Secondary | ICD-10-CM | POA: Diagnosis not present

## 2019-09-27 DIAGNOSIS — G4733 Obstructive sleep apnea (adult) (pediatric): Secondary | ICD-10-CM | POA: Diagnosis not present

## 2019-09-27 DIAGNOSIS — Z1211 Encounter for screening for malignant neoplasm of colon: Secondary | ICD-10-CM

## 2019-09-27 DIAGNOSIS — E119 Type 2 diabetes mellitus without complications: Secondary | ICD-10-CM

## 2019-09-27 DIAGNOSIS — E7800 Pure hypercholesterolemia, unspecified: Secondary | ICD-10-CM

## 2019-09-27 DIAGNOSIS — Z9989 Dependence on other enabling machines and devices: Secondary | ICD-10-CM | POA: Insufficient documentation

## 2019-09-27 DIAGNOSIS — I10 Essential (primary) hypertension: Secondary | ICD-10-CM

## 2019-09-27 NOTE — Addendum Note (Signed)
Addended by: Amado Coe on: 09/27/2019 09:34 AM   Modules accepted: Orders

## 2019-09-27 NOTE — Assessment & Plan Note (Signed)
Well controlled in the office today. Commended him on weight loss, encouraged to continue.  Continue Metformin ER 1000 mg daily. Managed on statin and Ace-I. Eye exam UTD. Pneumonia vaccination UTD.  Follow up in 6 months.

## 2019-09-27 NOTE — Assessment & Plan Note (Signed)
Well controlled on current regimen, continue same.  CMP reviewed.

## 2019-09-27 NOTE — Assessment & Plan Note (Signed)
Compliant to CPAP machine nightly, continue same. 

## 2019-09-27 NOTE — Assessment & Plan Note (Signed)
Tetanus due, provided today.  Declines influenza and shingles vaccinations.  PSA UTD. Colonoscopy due, referral placed to GI.  Discussed the importance of a healthy diet and regular exercise in order for weight loss, and to reduce the risk of any potential medical problems.  Exam today unremarkable. Labs reviewed.

## 2019-09-27 NOTE — Patient Instructions (Signed)
Continue exercising. You should be getting 150 minutes of moderate intensity exercise weekly.  Continue to work on a healthy diet. Ensure you are consuming 64 ounces of water daily.  You will be contacted regarding your referral to GI for the colonoscopy.  Please let us know if you have not been contacted within two weeks.   Please schedule a follow up appointment in 6 months for diabetes check.   It was a pleasure to see you today!   Preventive Care 35-40 Years Old, Male Preventive care refers to lifestyle choices and visits with your health care provider that can promote health and wellness. This includes:  A yearly physical exam. This is also called an annual well check.  Regular dental and eye exams.  Immunizations.  Screening for certain conditions.  Healthy lifestyle choices, such as eating a healthy diet, getting regular exercise, not using drugs or products that contain nicotine and tobacco, and limiting alcohol use. What can I expect for my preventive care visit? Physical exam Your health care provider will check:  Height and weight. These may be used to calculate body mass index (BMI), which is a measurement that tells if you are at a healthy weight.  Heart rate and blood pressure.  Your skin for abnormal spots. Counseling Your health care provider may ask you questions about:  Alcohol, tobacco, and drug use.  Emotional well-being.  Home and relationship well-being.  Sexual activity.  Eating habits.  Work and work Statistician. What immunizations do I need?  Influenza (flu) vaccine  This is recommended every year. Tetanus, diphtheria, and pertussis (Tdap) vaccine  You may need a Td booster every 10 years. Varicella (chickenpox) vaccine  You may need this vaccine if you have not already been vaccinated. Zoster (shingles) vaccine  You may need this after age 71. Measles, mumps, and rubella (MMR) vaccine  You may need at least one dose of MMR if you  were born in 1957 or later. You may also need a second dose. Pneumococcal conjugate (PCV13) vaccine  You may need this if you have certain conditions and were not previously vaccinated. Pneumococcal polysaccharide (PPSV23) vaccine  You may need one or two doses if you smoke cigarettes or if you have certain conditions. Meningococcal conjugate (MenACWY) vaccine  You may need this if you have certain conditions. Hepatitis A vaccine  You may need this if you have certain conditions or if you travel or work in places where you may be exposed to hepatitis A. Hepatitis B vaccine  You may need this if you have certain conditions or if you travel or work in places where you may be exposed to hepatitis B. Haemophilus influenzae type b (Hib) vaccine  You may need this if you have certain risk factors. Human papillomavirus (HPV) vaccine  If recommended by your health care provider, you may need three doses over 6 months. You may receive vaccines as individual doses or as more than one vaccine together in one shot (combination vaccines). Talk with your health care provider about the risks and benefits of combination vaccines. What tests do I need? Blood tests  Lipid and cholesterol levels. These may be checked every 5 years, or more frequently if you are over 63 years old.  Hepatitis C test.  Hepatitis B test. Screening  Lung cancer screening. You may have this screening every year starting at age 16 if you have a 30-pack-year history of smoking and currently smoke or have quit within the past 15 years.  Prostate cancer screening. Recommendations will vary depending on your family history and other risks.  Colorectal cancer screening. All adults should have this screening starting at age 48 and continuing until age 25. Your health care provider may recommend screening at age 51 if you are at increased risk. You will have tests every 1-10 years, depending on your results and the type of  screening test.  Diabetes screening. This is done by checking your blood sugar (glucose) after you have not eaten for a while (fasting). You may have this done every 1-3 years.  Sexually transmitted disease (STD) testing. Follow these instructions at home: Eating and drinking  Eat a diet that includes fresh fruits and vegetables, whole grains, lean protein, and low-fat dairy products.  Take vitamin and mineral supplements as recommended by your health care provider.  Do not drink alcohol if your health care provider tells you not to drink.  If you drink alcohol: ? Limit how much you have to 0-2 drinks a day. ? Be aware of how much alcohol is in your drink. In the [place] one drink equals one 12 oz bottle of beer (355 mL), one 5 oz glass of wine (148 mL), or one 1 oz glass of hard liquor (44 mL). Lifestyle  Take daily care of your teeth and gums.  Stay active. Exercise for at least 30 minutes on 5 or more days each week.  Do not use any products that contain nicotine or tobacco, such as cigarettes, e-cigarettes, and chewing tobacco. If you need help quitting, ask your health care provider.  If you are sexually active, practice safe sex. Use a condom or other form of protection to prevent STIs (sexually transmitted infections).  Talk with your health care provider about taking a low-dose aspirin every day starting at age 30. What's next?  Go to your health care provider once a year for a well check visit.  Ask your health care provider how often you should have your eyes and teeth checked.  Stay up to date on all vaccines. This information is not intended to replace advice given to you by your health care provider. Make sure you discuss any questions you have with your health care provider. Document Revised: 12/31/2017 Document Reviewed: 12/31/2017 Elsevier Patient Education  2020 [employer].

## 2019-09-27 NOTE — Progress Notes (Signed)
Subjective:    Patient ID: Antonio Horton, male    DOB: 1965/12/11, 54 y.o.   MRN: [id number]  HPI  This visit occurred during the SARS-CoV-2 public health emergency.  Safety protocols were in place, including screening questions prior to the visit, additional usage of staff PPE, and extensive cleaning of exam room while observing appropriate contact time as indicated for disinfecting solutions.   Antonio Horton is a 54 year old male who presents today for complete physical.  Immunizations: -Tetanus: Completed in 2010, due.  -Influenza: Due, declines  -Shingles: Never completed  -Pneumonia: Completed last in 2021 -Covid-19: Will get soon.   Diet: He endorses a healthy diet.  Exercise: He is walking one mile daily   Eye exam: UTD Dental exam: Completes semi-annually   Colonoscopy: Never completed.  PSA: 0.62 in 2021 Hep C Screen: Negative  BP Readings from Last 3 Encounters:  09/27/19 112/80  03/25/19 126/82  01/24/19 124/82   Wt Readings from Last 3 Encounters:  09/27/19 292 lb (132.5 kg)  03/25/19 (!) 321 lb 8 oz (145.8 kg)  01/24/19 (!) 337 lb 8 oz (153.1 kg)     Review of Systems  Constitutional: Negative for unexpected weight change.  HENT: Negative for rhinorrhea.   Respiratory: Negative for cough and shortness of breath.   Cardiovascular: Negative for chest pain.  Gastrointestinal: Negative for constipation and diarrhea.  Genitourinary: Negative for difficulty urinating.  Musculoskeletal: Positive for arthralgias and back pain.  Skin: Negative for rash.  Allergic/Immunologic: Negative for environmental allergies.  Neurological: Negative for dizziness, numbness and headaches.  Psychiatric/Behavioral: The patient is not nervous/anxious.        Past Medical History:  Diagnosis Date  . Borderline diabetes   . Hypertension      Social History   Socioeconomic History  . Marital status: Married    Spouse name: Not on file  . Number of children: Not  on file  . Years of education: Not on file  . Highest education level: Not on file  Occupational History  . Not on file  Tobacco Use  . Smoking status: Never Smoker  . Smokeless tobacco: Never Used  Substance and Sexual Activity  . Alcohol use: Yes    Alcohol/week: 0.0 standard drinks    Comment: socially  . Drug use: No  . Sexual activity: Not on file  Other Topics Concern  . Not on file  Social History Narrative   Married.   2 children.   Works as a Administrator, sports.   Enjoys reading.    Social Determinants of Health   Financial Resource Strain:   . Difficulty of Paying Living Expenses: Not on file  Food Insecurity:   . Worried About Charity fundraiser in the Last Year: Not on file  . Ran Out of Food in the Last Year: Not on file  Transportation Needs:   . Lack of Transportation (Medical): Not on file  . Lack of Transportation (Non-Medical): Not on file  Physical Activity:   . Days of Exercise per Week: Not on file  . Minutes of Exercise per Session: Not on file  Stress:   . Feeling of Stress : Not on file  Social Connections:   . Frequency of Communication with Friends and Family: Not on file  . Frequency of Social Gatherings with Friends and Family: Not on file  . Attends Religious Services: Not on file  . Active Member of Clubs or Organizations: Not on file  . Attends  Club or Organization Meetings: Not on file  . Marital Status: Not on file  Intimate Partner Violence:   . Fear of Current or Ex-Partner: Not on file  . Emotionally Abused: Not on file  . Physically Abused: Not on file  . Sexually Abused: Not on file    Past Surgical History:  Procedure Laterality Date  . EYE SURGERY     Corneal dystrophies    Family History  Problem Relation Age of Onset  . Other Other        adopted    No Known Allergies  Current Outpatient Medications on File Prior to Visit  Medication Sig Dispense Refill  . prednisoLONE acetate (PRED FORTE) 1 % ophthalmic  suspension Place 1 drop into the left eye daily.     . Continuous Blood Gluc Receiver (FREESTYLE LIBRE 14 DAY READER) DEVI 1 Device by Does not apply route 3 (three) times daily as needed. 1 each 0  . Continuous Blood Gluc Sensor (FREESTYLE LIBRE 14 DAY SENSOR) MISC 1 Device by Does not apply route 3 (three) times daily as needed. 2 each 5  . lisinopril-hydrochlorothiazide (ZESTORETIC) 10-12.5 MG tablet Take 1 tablet by mouth daily. For blood pressure. 90 tablet 3  . metFORMIN (GLUCOPHAGE-XR) 500 MG 24 hr tablet Take 2 tablets (1,000 mg total) by mouth daily with breakfast. For diabetes. 180 tablet 3  . rosuvastatin (CRESTOR) 5 MG tablet Take 1 tablet (5 mg total) by mouth every evening. For cholesterol. 90 tablet 3   No current facility-administered medications on file prior to visit.    BP 112/80 (Cuff Size: Large)   Pulse 76   Ht 6\' 1"  (1.854 m)   Wt 292 lb (132.5 kg)   SpO2 96%   BMI 38.52 kg/m    Objective:   Physical Exam HENT:     Right Ear: Tympanic membrane and ear canal normal.     Left Ear: Tympanic membrane and ear canal normal.  Eyes:     Pupils: Pupils are equal, round, and reactive to light.  Cardiovascular:     Rate and Rhythm: Normal rate and regular rhythm.  Pulmonary:     Effort: Pulmonary effort is normal.     Breath sounds: Normal breath sounds.  Abdominal:     General: Bowel sounds are normal.     Palpations: Abdomen is soft.     Tenderness: There is no abdominal tenderness.  Musculoskeletal:        General: Normal range of motion.     Cervical back: Neck supple.  Skin:    General: Skin is warm and dry.  Neurological:     Mental Status: He is alert and oriented to person, place, and time.     Cranial Nerves: No cranial nerve deficit.     Deep Tendon Reflexes:     Reflex Scores:      Patellar reflexes are 2+ on the right side and 2+ on the left side.           Assessment & Plan:

## 2019-09-27 NOTE — Assessment & Plan Note (Signed)
LDL at goal, continue rosuvastatin.

## 2019-10-20 ENCOUNTER — Encounter: Payer: Self-pay | Admitting: Gastroenterology

## 2019-11-30 ENCOUNTER — Ambulatory Visit (AMBULATORY_SURGERY_CENTER): Payer: Self-pay | Admitting: *Deleted

## 2019-11-30 ENCOUNTER — Other Ambulatory Visit: Payer: Self-pay

## 2019-11-30 VITALS — Ht 73.0 in | Wt 289.0 lb

## 2019-11-30 DIAGNOSIS — Z1211 Encounter for screening for malignant neoplasm of colon: Secondary | ICD-10-CM

## 2019-11-30 MED ORDER — SUTAB 1479-225-188 MG PO TABS
1.0000 | ORAL_TABLET | Freq: Once | ORAL | 0 refills | Status: AC
Start: 2019-11-30 — End: 2019-11-30

## 2019-11-30 NOTE — Progress Notes (Signed)
Completed covid vaccines 11-03-19  Pt is aware that care partner will wait in the car during procedure; if they feel like they will be too hot or cold to wait in the car; they may wait in the 4 th floor lobby. Patient is aware to bring only one care partner. We want them to wear a mask (we do not have any that we can provide them), practice social distancing, and we will check their temperatures when they get here.  I did remind the patient that their care partner needs to stay in the parking lot the entire time and have a cell phone available, we will call them when the pt is ready for discharge. Patient will wear mask into building.    No trouble with anesthesia, difficulty with moving neck or hx/fam hx of malignant hyperthermia per pt   No egg or soy allergy  No home oxygen use   No medications for weight loss taken  emmi information given  Pt denies constipation issues   Sutab code put into RX and paper copy given to pt to show pharmacy

## 2019-12-01 ENCOUNTER — Encounter: Payer: Self-pay | Admitting: Gastroenterology

## 2019-12-14 ENCOUNTER — Other Ambulatory Visit: Payer: Self-pay

## 2019-12-14 ENCOUNTER — Ambulatory Visit (AMBULATORY_SURGERY_CENTER): Payer: 59 | Admitting: Gastroenterology

## 2019-12-14 ENCOUNTER — Other Ambulatory Visit: Payer: Self-pay | Admitting: Gastroenterology

## 2019-12-14 ENCOUNTER — Encounter: Payer: Self-pay | Admitting: Gastroenterology

## 2019-12-14 VITALS — BP 124/89 | HR 75 | Temp 97.5°F | Resp 14 | Ht 73.0 in | Wt 289.0 lb

## 2019-12-14 DIAGNOSIS — D125 Benign neoplasm of sigmoid colon: Secondary | ICD-10-CM

## 2019-12-14 DIAGNOSIS — Z1211 Encounter for screening for malignant neoplasm of colon: Secondary | ICD-10-CM | POA: Diagnosis not present

## 2019-12-14 DIAGNOSIS — D122 Benign neoplasm of ascending colon: Secondary | ICD-10-CM

## 2019-12-14 MED ORDER — FLEET ENEMA 7-19 GM/118ML RE ENEM
1.0000 | ENEMA | Freq: Once | RECTAL | Status: AC
  Administered 2019-12-14: 1 via RECTAL

## 2019-12-14 MED ORDER — SODIUM CHLORIDE 0.9 % IV SOLN: 500.0000 mL | Freq: Once | INTRAVENOUS | Status: DC

## 2019-12-14 NOTE — Progress Notes (Signed)
PT taken to PACU. Monitors in place. VSS. Report given to RN. 

## 2019-12-14 NOTE — Progress Notes (Signed)
Wife w pt in PACU, assists him with dressing, vss, pt d/c without distress

## 2019-12-14 NOTE — Progress Notes (Signed)
VS-CW  Pt's states no medical or surgical changes since previsit or office visit.  Pt reported brown liquid stool, no solid pieces, but could not see the bottom of the toilet. Order received for fleets enema. Pt tolerated enema and results were cloudy to clear yellow liquid.

## 2019-12-14 NOTE — Progress Notes (Signed)
Called to room to assist during endoscopic procedure.  Patient ID and intended procedure confirmed with present staff. Received instructions for my participation in the procedure from the performing physician.  

## 2019-12-14 NOTE — Patient Instructions (Signed)
Handouts given for polyps and hemorrhoids.  YOU HAD AN ENDOSCOPIC PROCEDURE TODAY AT [facility]:   Refer to the procedure report that was given to you for any specific questions about what was found during the examination.  If the procedure report does not answer your questions, please call your gastroenterologist to clarify.  If you requested that your care partner not be given the details of your procedure findings, then the procedure report has been included in a sealed envelope for you to review at your convenience later.  YOU SHOULD EXPECT: Some feelings of bloating in the abdomen. Passage of more gas than usual.  Walking can help get rid of the air that was put into your GI tract during the procedure and reduce the bloating. If you had a lower endoscopy (such as a colonoscopy or flexible sigmoidoscopy) you may notice spotting of blood in your stool or on the toilet paper. If you underwent a bowel prep for your procedure, you may not have a normal bowel movement for a few days.  Please Note:  You might notice some irritation and congestion in your nose or some drainage.  This is from the oxygen used during your procedure.  There is no need for concern and it should clear up in a day or so.  SYMPTOMS TO REPORT IMMEDIATELY:   Following lower endoscopy (colonoscopy or flexible sigmoidoscopy):  Excessive amounts of blood in the stool  Significant tenderness or worsening of abdominal pains  Swelling of the abdomen that is new, acute  Fever of 100F or higher  For urgent or emergent issues, a gastroenterologist can be reached at any hour by calling [phone]. Do not use MyChart messaging for urgent concerns.    DIET:  We do recommend a small meal at first, but then you may proceed to your regular diet.  Drink plenty of fluids but you should avoid alcoholic beverages for 24 hours.  ACTIVITY:  You should plan to take it easy for the rest of today and you should NOT DRIVE,  NO WORK or use heavy machinery until tomorrow (because of the sedation medicines used during the test).    FOLLOW UP: Our staff will call the number listed on your records Monday 11/29 between 715 am and 8 am following your procedure to check on you and address any questions or concerns that you may have regarding the information given to you following your procedure. If we do not reach you, we will leave a message.  We will attempt to reach you two times.  During this call, we will ask if you have developed any symptoms of COVID 19. If you develop any symptoms (ie: fever, flu-like symptoms, shortness of breath, cough etc.) before then, please call [phone].  If you test positive for Covid 19 in the 2 weeks post procedure, please call and report this information to [place].    If any biopsies were taken you will be contacted by phone or by letter within the next 1-3 weeks.  Please call us at [phone] if you have not heard about the biopsies in 3 weeks.    SIGNATURES/CONFIDENTIALITY: You and/or your care partner have signed paperwork which will be entered into your electronic medical record.  These signatures attest to the fact that that the information above on your After Visit Summary has been reviewed and is understood.  Full responsibility of the confidentiality of this discharge information lies with you and/or your care-partner.

## 2019-12-14 NOTE — Op Note (Signed)
[facility] Patient Name: Antonio Horton Procedure Date: 12/14/2019 12:08 PM MRN: [id number] Endoscopist: Remo Lipps P. Havery Moros , MD Age: 54 Referring MD:  Date of Birth: [birth year] Gender: Male Account #: [id number] Procedure:                Colonoscopy Indications:              Screening for colorectal malignant neoplasm, This                            is the patient's first colonoscopy Medicines:                Monitored Anesthesia Care Procedure:                Pre-Anesthesia Assessment:                           - Prior to the procedure, a History and Physical                            was performed, and patient medications and                            allergies were reviewed. The patient's tolerance of                            previous anesthesia was also reviewed. The risks                            and benefits of the procedure and the sedation                            options and risks were discussed with the patient.                            All questions were answered, and informed consent                            was obtained. Prior Anticoagulants: The patient has                            taken no previous anticoagulant or antiplatelet                            agents. ASA Grade Assessment: III - A patient with                            severe systemic disease. After reviewing the risks                            and benefits, the patient was deemed in                            satisfactory condition to undergo the procedure.  After obtaining informed consent, the colonoscope                            was passed under direct vision. Throughout the                            procedure, the patient's blood pressure, pulse, and                            oxygen saturations were monitored continuously. The                            Colonoscope was introduced through the anus and                            advanced to  the the cecum, identified by                            appendiceal orifice and ileocecal valve. The                            colonoscopy was performed without difficulty. The                            patient tolerated the procedure well. The quality                            of the bowel preparation was adequate. The                            ileocecal valve, appendiceal orifice, and rectum                            were photographed. Scope In: 12:13:58 PM Scope Out: 12:37:00 PM Scope Withdrawal Time: 0 hours 19 minutes 5 seconds  Total Procedure Duration: 0 hours 23 minutes 2 seconds  Findings:                 The perianal and digital rectal examinations were                            normal.                           A single small angiodysplastic lesion was found in                            the transverse colon.                           A diminutive polyp was found in the ascending                            colon. The polyp was sessile. The polyp was removed  with a cold biopsy forceps. Resection and retrieval                            were complete.                           A 3 mm polyp was found in the sigmoid colon. The                            polyp was sessile. The polyp was removed with a                            cold biopsy forceps. Resection and retrieval were                            complete.                           Internal hemorrhoids were found during retroflexion.                           There was some looping in the right colon. Time was                            need to lavage the right colon to achieve adequate                            views in that area. The exam was otherwise without                            abnormality. Complications:            No immediate complications. Estimated blood loss:                            Minimal. Estimated Blood Loss:     Estimated blood loss was minimal. Impression:                - A single colonic angiodysplastic lesion.                           - One diminutive polyp in the ascending colon,                            removed with a cold biopsy forceps. Resected and                            retrieved.                           - One 3 mm polyp in the sigmoid colon, removed with                            a cold biopsy forceps. Resected and retrieved.                           -  Internal hemorrhoids.                           - The examination was otherwise normal. Recommendation:           - Patient has a contact number available for                            emergencies. The signs and symptoms of potential                            delayed complications were discussed with the                            patient. Return to normal activities tomorrow.                            Written discharge instructions were provided to the                            patient.                           - Resume previous diet.                           - Continue present medications.                           - Await pathology results. Remo Lipps P. Damiel Barthold, MD 12/14/2019 12:42:12 PM This report has been signed electronically.

## 2019-12-19 ENCOUNTER — Telehealth: Payer: Self-pay | Admitting: *Deleted

## 2019-12-19 NOTE — Telephone Encounter (Signed)
  Follow up Call-  Call back number 12/14/2019  Post procedure Call Back phone  # [phone]  Permission to leave phone message Yes  Some recent data might be hidden     Patient questions:  Do you have a fever, pain , or abdominal swelling? No. Pain Score  0 *  Have you tolerated food without any problems? Yes.    Have you been able to return to your normal activities? Yes.    Do you have any questions about your discharge instructions: Diet   No. Medications  No. Follow up visit  No.  Do you have questions or concerns about your Care? No.  Actions: * If pain score is 4 or above: No action needed, pain <4.  1. Have you developed a fever since your procedure? no  2.   Have you had an respiratory symptoms (SOB or cough) since your procedure? no  3.   Have you tested positive for COVID 19 since your procedure no  4.   Have you had any family members/close contacts diagnosed with the COVID 19 since your procedure?  no   If yes to any of these questions please route to Joylene John, RN and Joella Prince, RN

## 2020-01-09 ENCOUNTER — Other Ambulatory Visit: Payer: Self-pay | Admitting: Primary Care

## 2020-01-09 DIAGNOSIS — E119 Type 2 diabetes mellitus without complications: Secondary | ICD-10-CM

## 2020-01-09 DIAGNOSIS — I10 Essential (primary) hypertension: Secondary | ICD-10-CM

## 2020-01-09 DIAGNOSIS — E7800 Pure hypercholesterolemia, unspecified: Secondary | ICD-10-CM

## 2020-03-26 ENCOUNTER — Ambulatory Visit: Payer: 59 | Admitting: Primary Care

## 2020-03-28 ENCOUNTER — Other Ambulatory Visit: Payer: Self-pay

## 2020-03-28 ENCOUNTER — Encounter: Payer: Self-pay | Admitting: Primary Care

## 2020-03-28 ENCOUNTER — Ambulatory Visit (INDEPENDENT_AMBULATORY_CARE_PROVIDER_SITE_OTHER): Payer: 59 | Admitting: Primary Care

## 2020-03-28 VITALS — BP 130/78 | HR 76 | Temp 97.5°F | Ht 73.0 in | Wt 290.0 lb

## 2020-03-28 DIAGNOSIS — Z23 Encounter for immunization: Secondary | ICD-10-CM

## 2020-03-28 DIAGNOSIS — E7800 Pure hypercholesterolemia, unspecified: Secondary | ICD-10-CM | POA: Diagnosis not present

## 2020-03-28 DIAGNOSIS — E119 Type 2 diabetes mellitus without complications: Secondary | ICD-10-CM | POA: Diagnosis not present

## 2020-03-28 DIAGNOSIS — I10 Essential (primary) hypertension: Secondary | ICD-10-CM | POA: Diagnosis not present

## 2020-03-28 LAB — POCT GLYCOSYLATED HEMOGLOBIN (HGB A1C): Hemoglobin A1C: 5.4 % (ref 4.0–5.6)

## 2020-03-28 MED ORDER — METFORMIN HCL ER 500 MG PO TB24: 500.0000 mg | ORAL_TABLET | Freq: Every day | ORAL | 1 refills | Status: DC

## 2020-03-28 MED ORDER — LISINOPRIL-HYDROCHLOROTHIAZIDE 10-12.5 MG PO TABS: 1.0000 | ORAL_TABLET | Freq: Every day | ORAL | 1 refills | Status: DC

## 2020-03-28 MED ORDER — ROSUVASTATIN CALCIUM 5 MG PO TABS: 5.0000 mg | ORAL_TABLET | Freq: Every evening | ORAL | 1 refills | Status: DC

## 2020-03-28 MED ORDER — METFORMIN HCL ER 500 MG PO TB24: 1000.0000 mg | ORAL_TABLET | Freq: Every day | ORAL | 3 refills | Status: DC

## 2020-03-28 NOTE — Progress Notes (Signed)
Subjective:   HPI  Antonio Horton is a 55 year old male with a history of hypertension, type 2 diabetes, hyperlipidemia, fuchs' corneal dystrophy who presents today for follow up of diabetes. He is also needing medication refills.   Current medications include: Metformin XR 1000 mg daily  He is checking his blood glucose 0 times daily.   Last A1C: 6.1 in August 2021, 5.4 today Last Eye Exam: UTD Last Foot Exam: Due Pneumonia Vaccination: 2021 ACE/ARB: Lisinopril  Statin: Crestor   BP Readings from Last 3 Encounters:  03/28/20 130/78  12/14/19 124/89  09/27/19 112/80     Review of Systems  Respiratory: Negative for shortness of breath.   Cardiovascular: Negative for chest pain.  Neurological: Negative for numbness.          Past Medical History:  Diagnosis Date  . Borderline diabetes   . Diabetes mellitus without complication ([place])   . Hyperlipidemia   . Hypertension   . Sleep apnea    wears CPAP  . Vasovagal reaction      Social History   Socioeconomic History  . Marital status: Married    Spouse name: Not on file  . Number of children: Not on file  . Years of education: Not on file  . Highest education level: Not on file  Occupational History  . Not on file  Tobacco Use  . Smoking status: Never Smoker  . Smokeless tobacco: Never Used  Vaping Use  . Vaping Use: Never used  Substance and Sexual Activity  . Alcohol use: Not Currently    Alcohol/week: 0.0 standard drinks    Comment: socially  . Drug use: No  . Sexual activity: Not on file  Other Topics Concern  . Not on file  Social History Narrative   Married.   2 children.   Works as a Administrator, sports.   Enjoys reading.    Social Determinants of Health   Financial Resource Strain: Not on file  Food Insecurity: Not on file  Transportation Needs: Not on file  Physical Activity: Not on file  Stress: Not on file  Social Connections: Not on file  Intimate Partner Violence: Not on file     Past Surgical History:  Procedure Laterality Date  . EYE SURGERY     Corneal dystrophies    Family History  Adopted: Yes  Problem Relation Age of Onset  . Other Other        adopted    No Known Allergies  Current Outpatient Medications on File Prior to Visit  Medication Sig Dispense Refill  . Continuous Blood Gluc Receiver (FREESTYLE LIBRE 14 DAY READER) DEVI 1 Device by Does not apply route 3 (three) times daily as needed. 1 each 0  . Continuous Blood Gluc Sensor (FREESTYLE LIBRE 14 DAY SENSOR) MISC 1 Device by Does not apply route 3 (three) times daily as needed. 2 each 5  . prednisoLONE acetate (PRED FORTE) 1 % ophthalmic suspension Place 1 drop into the left eye daily.      No current facility-administered medications on file prior to visit.    BP 130/78   Pulse 76   Temp (!) 97.5 F (36.4 C) (Temporal)   Ht 6\' 1"  (1.854 m)   Wt 290 lb (131.5 kg)   SpO2 97%   BMI 38.26 kg/m    Objective:      Physical Exam Constitutional:      Appearance: He is well-nourished.  Cardiovascular:     Rate and  Rhythm: Normal rate and regular rhythm.  Pulmonary:     Effort: Pulmonary effort is normal.     Breath sounds: Normal breath sounds. No wheezing or rales.  Musculoskeletal:     Cervical back: Neck supple.  Skin:    General: Skin is warm and dry.  Neurological:     Mental Status: He is alert and oriented to person, place, and time.  Psychiatric:        Mood and Affect: Mood and affect normal.           Assessment & Plan:       Pleas Koch, NP  This visit occurred during the SARS-CoV-2 public health emergency.  Safety protocols were in place, including screening questions prior to the visit, additional usage of staff PPE, and extensive cleaning of exam room while observing appropriate contact time as indicated for disinfecting solutions.

## 2020-03-28 NOTE — Addendum Note (Signed)
Addended by: Francella Solian on: 03/28/2020 02:16 PM   Modules accepted: Orders

## 2020-03-28 NOTE — Assessment & Plan Note (Signed)
A1C of 5.4 today which is significant improvement from last visit! Commended him on his lifestyle changes!  Reduce metformin XR to 500 mg daily, updated prescription.   Foot exam today. Eye exam UTD. Managed on statin and ACE-I. Pneumovax UTD.  Follow up 6 months.

## 2020-03-28 NOTE — Patient Instructions (Signed)
We reduced your Metformin XR to 500 mg once daily.  Today your A1C was 5.4!! [place] job!  Continue exercising. You should be getting 150 minutes of moderate intensity exercise weekly.  Continue to work on a healthy diet. Ensure you are consuming 64 ounces of water daily.  Please schedule a follow up appointment in 6 months for your annual physical.  It was a pleasure to see you today!

## 2020-06-13 ENCOUNTER — Telehealth: Payer: Self-pay | Admitting: *Deleted

## 2020-06-13 LAB — NOVEL CORONAVIRUS, NAA: SARS-CoV-2, NAA: POSITIVE

## 2020-06-13 NOTE — Telephone Encounter (Signed)
This is an FYI, all patients must have a positive COVID test result in order to qualify for any COVID treatment.  Please have him tested for COVID.  I am okay to add him on for 06/14/2020, please schedule.  It looks like I have a 12 PM opening.

## 2020-06-13 NOTE — Telephone Encounter (Signed)
Patient notified by telephone as instructed. Patient's wife was advised earlier that her husband would need to be tested for covid.  Antonio Horton stated that they do not have any more home coivd test.  Patient scheduled for a virtual visit with Allie Bossier NP tomorrow 06/14/20 at 12:00 PM. Patient was advised that he does need to get a covid test done and send the results to Allie Bossier NP thru my chart and patient verbalized understanding.

## 2020-06-13 NOTE — Telephone Encounter (Signed)
Patient's wife called stating that she is sure that her husband has covid. Patient stated that she did a home covid test Tuesday that was positive plus she was exposed to covid by a friend. Patient's wife stated that her husband started with symptoms Tuesday. Antonio Horton stated that her husband has a fever, chills, body aches, cough and headache. Patient's wife denies that he has SOB or difficulty breathing. Patient's wife stated that her husband has not had a covid test done but since she is positive and he has the same symptoms that she does they figure he has covid also. Antonio Horton is aware that there are not any appointments available today at the office. Patient's wife was given ER precautions and she verbalized understanding. Antonio Horton is aware that this message will go back to Allie Bossier NP for her input.

## 2020-06-14 ENCOUNTER — Other Ambulatory Visit: Payer: Self-pay

## 2020-06-14 ENCOUNTER — Encounter: Payer: Self-pay | Admitting: Primary Care

## 2020-06-14 ENCOUNTER — Telehealth (INDEPENDENT_AMBULATORY_CARE_PROVIDER_SITE_OTHER): Payer: 59 | Admitting: Primary Care

## 2020-06-14 VITALS — BP 133/84 | HR 87 | Temp 99.4°F | Ht 73.0 in | Wt 288.0 lb

## 2020-06-14 DIAGNOSIS — U071 COVID-19: Secondary | ICD-10-CM

## 2020-06-14 HISTORY — DX: COVID-19: U07.1

## 2020-06-14 MED ORDER — NIRMATRELVIR/RITONAVIR (PAXLOVID) TABLET (RENAL DOSING): 2.0000 | ORAL_TABLET | Freq: Two times a day (BID) | ORAL | 0 refills | Status: AC

## 2020-06-14 NOTE — Assessment & Plan Note (Signed)
Positive result as of last night, two days of symptoms. Appears acutely ill but stable for outpatient treatment.  Discussed treatment options, he prefers oral treatment. Rx for Paxlovid sent to pharmacy, discussed details for administration and side effects.

## 2020-06-14 NOTE — Patient Instructions (Signed)
Start the Paxlovid medication for Covid-19 treatment. Take both of the tablets twice daily for five days.  Please update me if no improvement and/or if you have a problem with the medication.   It was a pleasure to see you today!

## 2020-06-14 NOTE — Progress Notes (Signed)
Patient ID: Antonio Horton, male    DOB: Dec 18, 1965, 55 y.o.   MRN: [id number]  Virtual visit completed through [employer], a video enabled telemedicine application. Due to national recommendations of social distancing due to COVID-19, a virtual visit is felt to be most appropriate for this patient at this time. Reviewed limitations, risks, security and privacy concerns of performing a virtual visit and the availability of in person appointments. I also reviewed that there may be a patient responsible charge related to this service. The patient agreed to proceed.   Patient location: home Provider location:  at [facility], office Persons participating in this virtual visit: patient, provider  If any vitals were documented, they were collected by patient at home unless specified below.    BP 133/84   Pulse 87   Temp 99.4 F (37.4 C)   Ht 6\' 1"  (1.854 m)   Wt 288 lb (130.6 kg)   BMI 38.00 kg/m    CC: Covid-19 positive Subjective:   HPI: Antonio Horton is a 55 y.o. male with a history of hypertension, OSA, hyperlipidemia, type 2 diabetes presenting on 06/14/2020 for Cough (C/o cough, fever, chills, body aches and HA.  Denies SOB.  Sxs started 06/12/20.  Pt was exposed to wife who is COVID.  Home test on 06/13/20 shows pt COVID+. )  Symptoms began two days ago and include chills, body aches, cough, fatigue. He took a Covid test yesterday which was positive. He sent results on MyChart.  He's taken Ibuprofen and cough drops with little improvement.  He's had two Covid-19 vaccines. His wife has the same symptoms, positive result, her symptoms began 1-2 days prior to his.   He's feeling miserable.       Relevant past medical, surgical, family and social history reviewed and updated as indicated. Interim medical history since our last visit reviewed. Allergies and medications reviewed and updated. Outpatient Medications Prior to Visit  Medication Sig Dispense Refill   . lisinopril-hydrochlorothiazide (ZESTORETIC) 10-12.5 MG tablet Take 1 tablet by mouth daily. For blood pressure. 90 tablet 1  . metFORMIN (GLUCOPHAGE-XR) 500 MG 24 hr tablet Take 1 tablet (500 mg total) by mouth daily with breakfast. For diabetes. 90 tablet 1  . prednisoLONE acetate (PRED FORTE) 1 % ophthalmic suspension Place 1 drop into the left eye daily.     . rosuvastatin (CRESTOR) 5 MG tablet Take 1 tablet (5 mg total) by mouth every evening. For cholesterol. 90 tablet 1  . Continuous Blood Gluc Receiver (FREESTYLE LIBRE 14 DAY READER) DEVI 1 Device by Does not apply route 3 (three) times daily as needed. (Patient not taking: Reported on 06/14/2020) 1 each 0  . Continuous Blood Gluc Sensor (FREESTYLE LIBRE 14 DAY SENSOR) MISC 1 Device by Does not apply route 3 (three) times daily as needed. (Patient not taking: Reported on 06/14/2020) 2 each 5   No facility-administered medications prior to visit.     Per HPI unless specifically indicated in ROS section below Review of Systems  Constitutional: Positive for chills, fatigue and fever.  HENT: Positive for congestion.   Respiratory: Positive for cough. Negative for shortness of breath.    Objective:  BP 133/84   Pulse 87   Temp 99.4 F (37.4 C)   Ht 6\' 1"  (1.854 m)   Wt 288 lb (130.6 kg)   BMI 38.00 kg/m   Wt Readings from Last 3 Encounters:  06/14/20 288 lb (130.6 kg)  03/28/20 290 lb (131.5 kg)  12/14/19 289  lb (131.1 kg)       Physical exam: Gen: alert, NAD, appears acutely ill. Pulm: speaks in complete sentences without increased work of breathing, dry cough noted Psych: normal mood, normal thought content      Results for orders placed or performed in visit on 03/28/20  HM DIABETES EYE EXAM  Result Value Ref Range   HM Diabetic Eye Exam No Retinopathy No Retinopathy   Assessment & Plan:   Problem List Items Addressed This Visit      Other   COVID-19 virus infection - Primary    Positive result as of last  night, two days of symptoms. Appears acutely ill but stable for outpatient treatment.  Discussed treatment options, he prefers oral treatment. Rx for Paxlovid sent to pharmacy, discussed details for administration and side effects.       Relevant Medications   nirmatrelvir/ritonavir EUA, renal dosing, (PAXLOVID) TABS       Meds ordered this encounter  Medications  . nirmatrelvir/ritonavir EUA, renal dosing, (PAXLOVID) TABS    Sig: Take 2 tablets by mouth 2 (two) times daily for 5 days.    Dispense:  20 tablet    Refill:  0    GFR of 82    Order Specific Question:   Supervising Provider    Answer:   BEDSOLE, AMY E [[id number]]   No orders of the defined types were placed in this encounter.   I discussed the assessment and treatment plan with the patient. The patient was provided an opportunity to ask questions and all were answered. The patient agreed with the plan and demonstrated an understanding of the instructions. The patient was advised to call back or seek an in-person evaluation if the symptoms worsen or if the condition fails to improve as anticipated.  Follow up plan:  Start the Paxlovid medication for Covid-19 treatment. Take both of the tablets twice daily for five days.  Please update me if no improvement and/or if you have a problem with the medication.   It was a pleasure to see you today!   Pleas Koch, NP

## 2020-06-20 ENCOUNTER — Encounter: Payer: Self-pay | Admitting: Primary Care

## 2020-09-28 ENCOUNTER — Encounter: Payer: 59 | Admitting: Primary Care

## 2020-09-28 ENCOUNTER — Other Ambulatory Visit: Payer: Self-pay | Admitting: Primary Care

## 2020-09-28 DIAGNOSIS — I10 Essential (primary) hypertension: Secondary | ICD-10-CM

## 2020-10-17 ENCOUNTER — Other Ambulatory Visit: Payer: Self-pay | Admitting: Primary Care

## 2020-10-17 DIAGNOSIS — E7800 Pure hypercholesterolemia, unspecified: Secondary | ICD-10-CM

## 2020-10-18 ENCOUNTER — Other Ambulatory Visit: Payer: Self-pay | Admitting: Primary Care

## 2020-10-18 DIAGNOSIS — I10 Essential (primary) hypertension: Secondary | ICD-10-CM

## 2020-11-24 ENCOUNTER — Other Ambulatory Visit: Payer: Self-pay

## 2020-11-24 ENCOUNTER — Inpatient Hospital Stay (HOSPITAL_COMMUNITY)
Admission: EM | Admit: 2020-11-24 | Discharge: 2020-11-27 | DRG: 417 | Disposition: A | Discharge status: Home / Self Care | Payer: 59 | Attending: Internal Medicine | Admitting: Internal Medicine

## 2020-11-24 ENCOUNTER — Emergency Department (HOSPITAL_COMMUNITY): Payer: 59

## 2020-11-24 DIAGNOSIS — E041 Nontoxic single thyroid nodule: Secondary | ICD-10-CM

## 2020-11-24 DIAGNOSIS — J9691 Respiratory failure, unspecified with hypoxia: Secondary | ICD-10-CM | POA: Diagnosis present

## 2020-11-24 DIAGNOSIS — Z79899 Other long term (current) drug therapy: Secondary | ICD-10-CM

## 2020-11-24 DIAGNOSIS — R112 Nausea with vomiting, unspecified: Secondary | ICD-10-CM

## 2020-11-24 DIAGNOSIS — A419 Sepsis, unspecified organism: Secondary | ICD-10-CM

## 2020-11-24 DIAGNOSIS — J189 Pneumonia, unspecified organism: Secondary | ICD-10-CM | POA: Diagnosis present

## 2020-11-24 DIAGNOSIS — Z8616 Personal history of COVID-19: Secondary | ICD-10-CM

## 2020-11-24 DIAGNOSIS — J309 Allergic rhinitis, unspecified: Secondary | ICD-10-CM | POA: Diagnosis present

## 2020-11-24 DIAGNOSIS — K5900 Constipation, unspecified: Secondary | ICD-10-CM | POA: Diagnosis present

## 2020-11-24 DIAGNOSIS — R0902 Hypoxemia: Secondary | ICD-10-CM

## 2020-11-24 DIAGNOSIS — R55 Syncope and collapse: Secondary | ICD-10-CM

## 2020-11-24 DIAGNOSIS — E119 Type 2 diabetes mellitus without complications: Secondary | ICD-10-CM | POA: Diagnosis present

## 2020-11-24 DIAGNOSIS — E876 Hypokalemia: Secondary | ICD-10-CM | POA: Diagnosis not present

## 2020-11-24 DIAGNOSIS — Z7984 Long term (current) use of oral hypoglycemic drugs: Secondary | ICD-10-CM

## 2020-11-24 DIAGNOSIS — Z9049 Acquired absence of other specified parts of digestive tract: Secondary | ICD-10-CM

## 2020-11-24 DIAGNOSIS — Z6839 Body mass index (BMI) 39.0-39.9, adult: Secondary | ICD-10-CM

## 2020-11-24 DIAGNOSIS — E785 Hyperlipidemia, unspecified: Secondary | ICD-10-CM | POA: Diagnosis present

## 2020-11-24 DIAGNOSIS — R1011 Right upper quadrant pain: Secondary | ICD-10-CM

## 2020-11-24 DIAGNOSIS — G4733 Obstructive sleep apnea (adult) (pediatric): Secondary | ICD-10-CM | POA: Diagnosis present

## 2020-11-24 DIAGNOSIS — Z20822 Contact with and (suspected) exposure to covid-19: Secondary | ICD-10-CM | POA: Diagnosis present

## 2020-11-24 DIAGNOSIS — K8020 Calculus of gallbladder without cholecystitis without obstruction: Secondary | ICD-10-CM

## 2020-11-24 DIAGNOSIS — K769 Liver disease, unspecified: Secondary | ICD-10-CM

## 2020-11-24 DIAGNOSIS — I10 Essential (primary) hypertension: Secondary | ICD-10-CM | POA: Diagnosis present

## 2020-11-24 DIAGNOSIS — K8010 Calculus of gallbladder with chronic cholecystitis without obstruction: Secondary | ICD-10-CM | POA: Diagnosis not present

## 2020-11-24 DIAGNOSIS — J984 Other disorders of lung: Secondary | ICD-10-CM

## 2020-11-24 DIAGNOSIS — E042 Nontoxic multinodular goiter: Secondary | ICD-10-CM | POA: Diagnosis present

## 2020-11-24 DIAGNOSIS — K760 Fatty (change of) liver, not elsewhere classified: Secondary | ICD-10-CM | POA: Diagnosis present

## 2020-11-24 DIAGNOSIS — K7460 Unspecified cirrhosis of liver: Secondary | ICD-10-CM | POA: Diagnosis present

## 2020-11-24 DIAGNOSIS — I7121 Aneurysm of the ascending aorta, without rupture: Secondary | ICD-10-CM | POA: Diagnosis present

## 2020-11-24 DIAGNOSIS — E7800 Pure hypercholesterolemia, unspecified: Secondary | ICD-10-CM | POA: Diagnosis present

## 2020-11-24 DIAGNOSIS — I959 Hypotension, unspecified: Secondary | ICD-10-CM

## 2020-11-24 DIAGNOSIS — E1165 Type 2 diabetes mellitus with hyperglycemia: Secondary | ICD-10-CM

## 2020-11-24 DIAGNOSIS — K8080 Other cholelithiasis without obstruction: Secondary | ICD-10-CM

## 2020-11-24 DIAGNOSIS — H18519 Endothelial corneal dystrophy, unspecified eye: Secondary | ICD-10-CM | POA: Diagnosis present

## 2020-11-24 LAB — I-STAT CHEM 8, ED
BUN: 18 mg/dL (ref 6–20)
Calcium, Ion: 1.02 mmol/L — ABNORMAL LOW (ref 1.15–1.40)
Chloride: 105 mmol/L (ref 98–111)
Creatinine, Ser: 1 mg/dL (ref 0.61–1.24)
Glucose, Bld: 193 mg/dL — ABNORMAL HIGH (ref 70–99)
HCT: 49 % (ref 39.0–52.0)
Hemoglobin: 16.7 g/dL (ref 13.0–17.0)
Potassium: 3.4 mmol/L — ABNORMAL LOW (ref 3.5–5.1)
Sodium: 138 mmol/L (ref 135–145)
TCO2: 21 mmol/L — ABNORMAL LOW (ref 22–32)

## 2020-11-24 LAB — I-STAT VENOUS BLOOD GAS, ED
Acid-base deficit: 1 mmol/L (ref 0.0–2.0)
Bicarbonate: 22.3 mmol/L (ref 20.0–28.0)
Calcium, Ion: 0.99 mmol/L — ABNORMAL LOW (ref 1.15–1.40)
HCT: 49 % (ref 39.0–52.0)
Hemoglobin: 16.7 g/dL (ref 13.0–17.0)
O2 Saturation: 99 %
Potassium: 3.6 mmol/L (ref 3.5–5.1)
Sodium: 137 mmol/L (ref 135–145)
TCO2: 23 mmol/L (ref 22–32)
pCO2, Ven: 32.2 mmHg — ABNORMAL LOW (ref 44.0–60.0)
pH, Ven: 7.449 — ABNORMAL HIGH (ref 7.250–7.430)
pO2, Ven: 115 mmHg — ABNORMAL HIGH (ref 32.0–45.0)

## 2020-11-24 LAB — TROPONIN I (HIGH SENSITIVITY): Troponin I (High Sensitivity): 5 ng/L (ref <18)

## 2020-11-24 LAB — CBC WITH DIFFERENTIAL/PLATELET
Abs Immature Granulocytes: 0.05 10*3/uL (ref 0.00–0.07)
Basophils Absolute: 0 10*3/uL (ref 0.0–0.1)
Basophils Relative: 0 %
Eosinophils Absolute: 0.2 10*3/uL (ref 0.0–0.5)
Eosinophils Relative: 2 %
HCT: 49.4 % (ref 39.0–52.0)
Hemoglobin: 17.1 g/dL — ABNORMAL HIGH (ref 13.0–17.0)
Immature Granulocytes: 0 %
Lymphocytes Relative: 15 %
Lymphs Abs: 2 10*3/uL (ref 0.7–4.0)
MCH: 30.8 pg (ref 26.0–34.0)
MCHC: 34.6 g/dL (ref 30.0–36.0)
MCV: 88.8 fL (ref 80.0–100.0)
Monocytes Absolute: 0.9 10*3/uL (ref 0.1–1.0)
Monocytes Relative: 6 %
Neutro Abs: 10.6 10*3/uL — ABNORMAL HIGH (ref 1.7–7.7)
Neutrophils Relative %: 77 %
Platelets: 246 10*3/uL (ref 150–400)
RBC: 5.56 MIL/uL (ref 4.22–5.81)
RDW: 12.5 % (ref 11.5–15.5)
WBC: 13.8 10*3/uL — ABNORMAL HIGH (ref 4.0–10.5)
nRBC: 0 % (ref 0.0–0.2)

## 2020-11-24 LAB — COMPREHENSIVE METABOLIC PANEL
ALT: 19 U/L (ref 0–44)
AST: 17 U/L (ref 15–41)
Albumin: 3.2 g/dL — ABNORMAL LOW (ref 3.5–5.0)
Alkaline Phosphatase: 37 U/L — ABNORMAL LOW (ref 38–126)
Anion gap: 13 (ref 5–15)
BUN: 16 mg/dL (ref 6–20)
CO2: 18 mmol/L — ABNORMAL LOW (ref 22–32)
Calcium: 8.4 mg/dL — ABNORMAL LOW (ref 8.9–10.3)
Chloride: 105 mmol/L (ref 98–111)
Creatinine, Ser: 1.02 mg/dL (ref 0.61–1.24)
GFR, Estimated: >60 mL/min (ref >60)
Glucose, Bld: 187 mg/dL — ABNORMAL HIGH (ref 70–99)
Potassium: 3.4 mmol/L — ABNORMAL LOW (ref 3.5–5.1)
Sodium: 136 mmol/L (ref 135–145)
Total Bilirubin: 0.9 mg/dL (ref 0.3–1.2)
Total Protein: 6.2 g/dL — ABNORMAL LOW (ref 6.5–8.1)

## 2020-11-24 LAB — RESP PANEL BY RT-PCR (FLU A&B, COVID) ARPGX2
Influenza A by PCR: NEGATIVE
Influenza B by PCR: NEGATIVE
SARS Coronavirus 2 by RT PCR: NEGATIVE

## 2020-11-24 LAB — PROTIME-INR
INR: 1.2 (ref 0.8–1.2)
Prothrombin Time: 15 seconds (ref 11.4–15.2)

## 2020-11-24 LAB — TYPE AND SCREEN
ABO/RH(D): A POS
Antibody Screen: NEGATIVE

## 2020-11-24 LAB — LACTIC ACID, PLASMA: Lactic Acid, Venous: 2.1 mmol/L (ref 0.5–1.9)

## 2020-11-24 LAB — LIPASE, BLOOD: Lipase: 26 U/L (ref 11–51)

## 2020-11-24 LAB — APTT: aPTT: 29 seconds (ref 24–36)

## 2020-11-24 MED ORDER — LACTATED RINGERS IV BOLUS (SEPSIS)
500.0000 mL | Freq: Once | INTRAVENOUS | Status: AC
  Administered 2020-11-25: 500 mL via INTRAVENOUS

## 2020-11-24 MED ORDER — LACTATED RINGERS IV BOLUS (SEPSIS)
1000.0000 mL | Freq: Once | INTRAVENOUS | Status: AC
  Administered 2020-11-24: 1000 mL via INTRAVENOUS

## 2020-11-24 MED ORDER — LACTATED RINGERS IV BOLUS (SEPSIS): 1000.0000 mL | Freq: Once | INTRAVENOUS | Status: DC

## 2020-11-24 MED ORDER — IOHEXOL 350 MG/ML SOLN
100.0000 mL | Freq: Once | INTRAVENOUS | Status: AC | PRN
  Administered 2020-11-24: 100 mL via INTRAVENOUS

## 2020-11-24 MED ORDER — LACTATED RINGERS IV SOLN: INTRAVENOUS | Status: DC

## 2020-11-24 MED ORDER — LACTATED RINGERS IV BOLUS
1000.0000 mL | Freq: Once | INTRAVENOUS | Status: AC
  Administered 2020-11-24: 1000 mL via INTRAVENOUS

## 2020-11-24 MED ORDER — VANCOMYCIN HCL 2000 MG/400ML IV SOLN
2000.0000 mg | Freq: Once | INTRAVENOUS | Status: AC
  Administered 2020-11-25: 2000 mg via INTRAVENOUS
  Filled 2020-11-24 (×2): qty 400

## 2020-11-24 MED ORDER — SODIUM CHLORIDE 0.9 % IV SOLN
2.0000 g | Freq: Once | INTRAVENOUS | Status: AC
  Administered 2020-11-24: 2 g via INTRAVENOUS
  Filled 2020-11-24: qty 2

## 2020-11-24 MED ORDER — METRONIDAZOLE 500 MG/100ML IV SOLN
500.0000 mg | Freq: Once | INTRAVENOUS | Status: AC
  Administered 2020-11-24: 500 mg via INTRAVENOUS
  Filled 2020-11-24: qty 100

## 2020-11-24 NOTE — ED Triage Notes (Addendum)
Pt BIB [facility] from home following a syncopal episode. Pt was home c/o abdominal pain throughout the week. Began to have several episodes of vomiting following syncopal episode. Pt has injury from syncopal episode, was sitting down at the time of the event. States syncopal episode in the past  Pt diaphoretic, pale, and weak on arrival.   [facility] VS 92/60, HR 80, RR 20, 92% RA, GCS 15, and CBG 222. Given 4 Zofran and 400 mL fluid PTA. 18 G left forearm

## 2020-11-24 NOTE — ED Notes (Signed)
Pt 88% on room air. Placed on 2 L of oxygen. SpO2 increased to 94%

## 2020-11-24 NOTE — ED Provider Notes (Signed)
[facility] EMERGENCY DEPARTMENT Provider Note   CSN: [id number] Arrival date & time: 11/24/20  2133     History Chief Complaint  Patient presents with   Loss of Consciousness   Abdominal Pain    Antonio Horton is a 55 y.o. male.  HPI     55 year old male with a history of diabetes, hypertension, hyperlipidemia, OSA, who presents with concern for lightheadedness, generalized weakness, syncope x3, abdominal pain worsening today and present for last week.   Reports epigastric abdominal pain present for the last week, significantly worsened today "like something popped", around 430PM began to feel lightheaded, had 3 syncopal events in the last hour.  Occurred in a chair, did not fall, no trauma.  Had nausea and vomiting. Feeling lightheaded,generally weak. Had some dyspnea today, began slowly. Had some sinus symptoms/ear symptoms last week.  No orthopnea or leg swelling.  No chest pain.  No black or bloody stools, no diarrhea or constipation.  Has not been eating as much over the last week, barely ate anything today.  Takes ibuprofen but not significant amount, no other pain medications, no significant etoh.   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/08/2013   Fuchs' corneal dystrophy 10/25/2012   ALLERGIC RHINITIS CAUSE UNSPECIFIED 38/10/1749   DYSMETABOLIC SYNDROME 02/58/5277   PURE HYPERCHOLESTEROLEMIA 12/10/2006   Type 2 diabetes mellitus ([place]) 09/09/2006   OBESITY,  MORBID 09/01/2006   Obstructive sleep apnea 09/01/2006   Essential hypertension 09/01/2006    Past Surgical History:  Procedure Laterality Date   EYE SURGERY     Corneal dystrophies       Family History  Adopted: Yes  Problem Relation Age of Onset   Other Other        adopted    Social History   Tobacco Use   Smoking status: Never   Smokeless tobacco: Never  Vaping Use   Vaping Use: Never used  Substance Use Topics   Alcohol use: Not Currently    Alcohol/week: 0.0 standard drinks    Comment: socially   Drug use: No    Home Medications Prior to Admission medications   Medication Sig Start Date End Date Taking? Authorizing Provider  Continuous Blood Gluc Receiver (FREESTYLE LIBRE 14 DAY READER) DEVI 1 Device by Does not apply route 3 (three) times daily as needed. Patient not taking: Reported on 06/14/2020 12/27/18   Pleas Koch, NP  Continuous Blood Gluc Sensor (FREESTYLE LIBRE 14 DAY SENSOR) MISC 1 Device by Does not apply route 3 (three) times daily as needed. Patient not taking: Reported on 06/14/2020 01/24/19   Pleas Koch, NP  lisinopril-hydrochlorothiazide (ZESTORETIC) 10-12.5 MG tablet TAKE 1 TABLET BY MOUTH DAILY. FOR BLOOD PRESSURE. OFFICE VISIT REQUIRED FOR FURTHER REFILLS. 10/18/20   Pleas Koch, NP  metFORMIN (GLUCOPHAGE-XR) 500 MG 24 hr tablet Take 1 tablet (500 mg total) by mouth daily with breakfast. For diabetes. 03/28/20   Pleas Koch, NP  prednisoLONE acetate (PRED FORTE) 1 % ophthalmic suspension Place 1 drop into the left eye daily.  12/31/18   [provider]  rosuvastatin (CRESTOR) 5 MG tablet Take 1 tablet (5 mg total) by mouth every evening. For cholesterol. Office visit required for further refills. 10/17/20   Pleas Koch, NP    Allergies    Patient has no known allergies.  Review of Systems   Review of Systems  Constitutional:  Positive for appetite change and fatigue.  HENT:  Positive for congestion and  sinus pain.   Eyes:  Negative for visual disturbance.  Respiratory:  Positive for shortness of breath. Negative for cough.   Cardiovascular:  Negative for chest pain and leg swelling.  Gastrointestinal:  Positive for abdominal pain, nausea and vomiting. Negative for constipation and diarrhea.  Genitourinary:  Negative for dysuria.  Musculoskeletal:  Negative for back pain.  Skin:  Negative for rash.  Neurological:  Negative for headaches.   Physical Exam Updated Vital Signs BP 111/67   Pulse 88   Temp 97.8 F (36.6 C) (Oral)   Resp (!) 21   Ht 6\' 1"  (1.854 m)   Wt 136.1 kg   SpO2 95%   BMI 39.58 kg/m   Physical Exam Vitals and nursing note reviewed.  Constitutional:      General: He is not in acute distress.    Appearance: He is well-developed. He is ill-appearing and toxic-appearing. He is not diaphoretic.  HENT:     Head: Normocephalic and atraumatic.  Eyes:     Conjunctiva/sclera: Conjunctivae normal.  Cardiovascular:     Rate and Rhythm: Normal rate and regular rhythm.     Heart sounds: Normal heart sounds. No murmur heard.   No friction rub. No gallop.  Pulmonary:     Effort: Pulmonary effort is normal. No respiratory distress.     Breath sounds: Normal breath sounds. No wheezing or rales.  Abdominal:     General: There is no distension.     Palpations: Abdomen is soft.     Tenderness: There is abdominal tenderness in the right upper quadrant, epigastric area and left upper quadrant. There is no guarding.  Musculoskeletal:     Cervical back: Normal range of motion.  Skin:    General: Skin is warm and dry.  Neurological:     Mental Status: He is alert and oriented to person, place, and time.    ED Results / Procedures / Treatments   Labs (all labs ordered are listed, but only abnormal results are displayed) Labs Reviewed  LACTIC ACID, PLASMA - Abnormal; Notable for the following components:      Result Value   Lactic Acid, Venous 2.1 (*)    All other  components within normal limits  COMPREHENSIVE METABOLIC PANEL - Abnormal; Notable for the following components:   Potassium 3.4 (*)    CO2 18 (*)    Glucose, Bld 187 (*)    Calcium 8.4 (*)    Total Protein 6.2 (*)    Albumin 3.2 (*)    Alkaline Phosphatase 37 (*)    All other components within normal limits  CBC WITH DIFFERENTIAL/PLATELET - Abnormal; Notable for the following components:   WBC 13.8 (*)    Hemoglobin 17.1 (*)    Neutro Abs 10.6 (*)    All other components within normal limits  I-STAT CHEM 8, ED - Abnormal; Notable for the following components:   Potassium 3.4 (*)    Glucose, Bld 193 (*)  Calcium, Ion 1.02 (*)    TCO2 21 (*)    All other components within normal limits  I-STAT VENOUS BLOOD GAS, ED - Abnormal; Notable for the following components:   pH, Ven 7.449 (*)    pCO2, Ven 32.2 (*)    pO2, Ven 115.0 (*)    Calcium, Ion 0.99 (*)    All other components within normal limits  RESP PANEL BY RT-PCR (FLU A&B, COVID) ARPGX2  CULTURE, BLOOD (ROUTINE X 2)  CULTURE, BLOOD (ROUTINE X 2)  URINE CULTURE  PROTIME-INR  APTT  LIPASE, BLOOD  LACTIC ACID, PLASMA  URINALYSIS, ROUTINE W REFLEX MICROSCOPIC  TYPE AND SCREEN  TROPONIN I (HIGH SENSITIVITY)  TROPONIN I (HIGH SENSITIVITY)    EKG EKG Interpretation  Date/Time:  Saturday November 24 2020 21:39:03 EDT Ventricular Rate:  83 PR Interval:  173 QRS Duration: 92 QT Interval:  352 QTC Calculation: 414 R Axis:   -26 Text Interpretation: Sinus rhythm Abnormal R-wave progression, late transition Inferior infarct, old Lateral leads are also involved No previous ECGs available Confirmed by Gareth Morgan [phone]) on 11/24/2020 11:02:34 PM  Radiology DG Chest Port 1 View  Result Date: 11/24/2020 CLINICAL DATA:  Sepsis. EXAM: PORTABLE CHEST 1 VIEW COMPARISON:  Chest CT dated 11/24/2020. FINDINGS: Mild eventration of the right hemidiaphragm. No focal consolidation, pleural effusion, pneumothorax. The cardiac  silhouette is within normal limits. No acute osseous pathology. IMPRESSION: No active disease. Electronically Signed   By: Anner Crete M.D.   On: 11/24/2020 23:05   CT Angio Chest/Abd/Pel for Dissection W and/or Wo Contrast  Result Date: 11/24/2020 CLINICAL DATA:  Chest and abdomen pain with aortic dissection suspected. EXAM: CT ANGIOGRAPHY CHEST, ABDOMEN AND PELVIS TECHNIQUE: Non-contrast CT of the chest was initially obtained. Multidetector CT imaging through the chest, abdomen and pelvis was performed using the standard protocol during bolus administration of intravenous contrast. Multiplanar reconstructed images and MIPs were obtained and reviewed to evaluate the vascular anatomy. CONTRAST:  129mL OMNIPAQUE IOHEXOL 350 MG/ML SOLN COMPARISON:  None. FINDINGS: CTA CHEST FINDINGS Cardiovascular: Normal cardiac size. No arterial dilatation or central embolus. Mild calcification in the distal right coronary artery with trace calcification in the left-sided arteries. 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 The remainder is normal in caliber and the great vessels are clear. There is no pericardial effusion. Mediastinum/Nodes: There are small scattered thyroid nodules, the largest is 1.8 cm in the left lobe. There is no intrathoracic adenopathy, focal esophageal thickening, hiatal hernia or tracheobronchial filling defect. Lungs/Pleura: There is no pleural effusion, thickening or pneumothorax. There is an asymmetrical elevated right hemidiaphragm consistent with eventration or paresis. There are faint ground-glass opacities in the lower lobes which are probably microatelectasis, less likely pneumonitis. The remaining lungs are clear. 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 No suspicious bone lesions. Review of  the MIP images confirms the above findings. CTA ABDOMEN AND PELVIS FINDINGS VASCULAR Aorta: Normal caliber aorta without aneurysm, dissection, vasculitis or significant stenosis. Celiac: Patent without evidence of aneurysm, dissection, vasculitis or significant stenosis. SMA: Patent without evidence of aneurysm, dissection, vasculitis or significant stenosis. Renals: Both renal arteries are patent without evidence of aneurysm, dissection, vasculitis, fibromuscular dysplasia or significant stenosis. IMA: Patent without evidence of aneurysm, dissection, vasculitis or significant stenosis. Inflow: Patent without evidence of aneurysm, dissection, vasculitis or  significant stenosis. Veins: No obvious venous abnormality within the limitations of this arterial phase study. Review of the MIP images confirms the above findings. NON-VASCULAR Hepatobiliary: Not well seen due to artifact from the patient's arms in the field. There are small stones in the gallbladder without wall thickening or biliary dilatation. There is a 4.1 cm low-density lesion medially in the right hepatic lobe posterior segment which measures 25 Hounsfield units above the typical density of fluid but appears thin walled and uncomplicated. The liver is mildly steatotic, without other obvious focal lesion. Appears to be a nodular surface contour in the left lobe which could suggest early cirrhosis. Pancreas: Unremarkable. Spleen: No mass enhancement or splenomegaly. Adrenals/Urinary Tract: There is no adrenal mass. No renal mass, calculus or hydronephrosis are seen. Stomach/Bowel: No dilatation or wall thickening, including the appendix. Constipation. Left colonic diverticula without evidence of acute diverticulitis Lymphatic: No enlarged abdominopelvic nodes. Reproductive: Normal prostate. Other: No free air, hemorrhage or fluid. Musculoskeletal: Advanced degenerative change lumbar spine. Multilevel lumbar foraminal stenosis. Review of the MIP images  confirms the above findings. IMPRESSION: 1. No aortic dissection. 2. 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 Normal abdominal aorta and branch arteries. 3. Faint haziness in the lower lobes is most likely microatelectasis, less likely pneumonitis. Elevated right diaphragm. 4. Small amount of calcification in the distal right coronary artery with trace calcification in the left-sided arteries. 5. Cholelithiasis without gallbladder thickening or biliary dilatation. 6. Question early changes of cirrhosis of liver. No splenomegaly or ascites. 7. 4.1 cm low-density lesion in the right hepatic lobe above the usual density of fluid. MRI recommended. 8. Constipation and diverticulosis. 9. 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 10. Thyroid nodules.  Routine ultrasound follow-up recommended. Electronically Signed   By: Telford Nab M.D.   On: 11/24/2020 23:08    Procedures Procedures   Medications Ordered in ED Medications  lactated ringers infusion (has no administration in time range)  vancomycin (VANCOREADY) IVPB 2000 mg/400 mL (has no administration in time range)  lactated ringers bolus 1,000 mL (1,000 mLs Intravenous Not Given 11/24/20 2229)    And  lactated ringers bolus 1,000 mL (0 mLs Intravenous Stopped 11/25/20 0008)    And  lactated ringers bolus 1,000 mL (1,000 mLs Intravenous New Bag/Given 11/24/20 2309)    And  lactated ringers bolus 500 mL (has no administration in time range)  lactated ringers bolus 1,000 mL (0 mLs Intravenous Stopped 11/25/20 0008)  ceFEPIme (MAXIPIME) 2 g in sodium chloride 0.9 % 100 mL IVPB (0 g Intravenous Stopped 11/25/20 0008)  metroNIDAZOLE (FLAGYL) IVPB 500 mg (0 mg Intravenous Stopped 11/25/20 0008)  iohexol (OMNIPAQUE) 350 MG/ML injection 100 mL (100 mLs Intravenous Contrast Given 11/24/20 2237)    ED Course  I have reviewed the triage vital signs and the nursing notes.  Pertinent labs & imaging results that were available during my  care of the patient were reviewed by me and considered in my medical decision making (see chart for details).    MDM Rules/Calculators/A&P                           55 year old male with a history of diabetes, hypertension, hyperlipidemia, OSA, who presents with concern for lightheadedness, generalized weakness, syncope x3, abdominal pain worsening today and present for last week. EKG without acute significant abnormalities.  On arrival to the emergency department found to have blood pressures in the 80s and 70s,  with heart rate in the 90s.  Given significant epigastric pain, differential diagnosis includes aortic dissection, ruptured AAA, pancreatitis, sepsis with possible cholecystitis, cholangitis, perforated peptic ulcer.  Also found to be hypoxic down to 87% on room air, and consider pneumonia/sepsis.  Consider PE, but given patient with primary abdominal pain without chest pain, feel CTA dissection study most appropriate for evaluation for abdominal pain and hypotension.  Initiated sepsis protocol, obtained a type and screen.  Given empiric antibiotics for sepsis secondary to unknown source.  CTA returned showing no significant etiology of abdominal pain, findings which may represent pneumonitis, multiple incidental findings which were discussed and will require CT surgery follow up, outpatient imaging.   Given persistent abdominal pain, ordered RUQ [place] for further evaluation for possible cholecystitis.  Blood pressures improved, canceled 4th liter of IV fluid given improvement.  Consider possible sepsis secondary to pneumonia, vagally mediated hypotension from abdominal pain, dehydration.   Plan on admission for further care, awaiting RUQ [place].    Final Clinical Impression(s) / ED Diagnoses Final diagnoses:  Right upper quadrant abdominal pain  Syncope, unspecified syncope type  Hypotension, unspecified hypotension type  Liver lesion  Community acquired pneumonia of left lung,  unspecified part of lung  Sepsis, due to unspecified organism, unspecified whether acute organ dysfunction present ([place])  Aneurysm of ascending aorta without rupture  Thyroid nodule    Rx / DC Orders ED Discharge Orders     None        Gareth Morgan, MD 11/25/20 [phone]

## 2020-11-24 NOTE — ED Notes (Signed)
Patient transported to CT 

## 2020-11-24 NOTE — ED Notes (Signed)
Patient transported to Ultrasound 

## 2020-11-24 NOTE — Sepsis Progress Note (Signed)
Elink following Code Sepsis. 

## 2020-11-25 ENCOUNTER — Encounter (HOSPITAL_COMMUNITY): Payer: Self-pay | Admitting: Emergency Medicine

## 2020-11-25 ENCOUNTER — Other Ambulatory Visit: Payer: Self-pay

## 2020-11-25 ENCOUNTER — Inpatient Hospital Stay (HOSPITAL_COMMUNITY): Payer: 59

## 2020-11-25 DIAGNOSIS — R6520 Severe sepsis without septic shock: Secondary | ICD-10-CM | POA: Diagnosis not present

## 2020-11-25 DIAGNOSIS — R55 Syncope and collapse: Secondary | ICD-10-CM | POA: Diagnosis present

## 2020-11-25 DIAGNOSIS — K8020 Calculus of gallbladder without cholecystitis without obstruction: Secondary | ICD-10-CM | POA: Diagnosis not present

## 2020-11-25 DIAGNOSIS — R0902 Hypoxemia: Secondary | ICD-10-CM | POA: Diagnosis not present

## 2020-11-25 DIAGNOSIS — K7460 Unspecified cirrhosis of liver: Secondary | ICD-10-CM | POA: Diagnosis present

## 2020-11-25 DIAGNOSIS — H18519 Endothelial corneal dystrophy, unspecified eye: Secondary | ICD-10-CM | POA: Diagnosis present

## 2020-11-25 DIAGNOSIS — E119 Type 2 diabetes mellitus without complications: Secondary | ICD-10-CM | POA: Diagnosis present

## 2020-11-25 DIAGNOSIS — E7800 Pure hypercholesterolemia, unspecified: Secondary | ICD-10-CM | POA: Diagnosis present

## 2020-11-25 DIAGNOSIS — A419 Sepsis, unspecified organism: Secondary | ICD-10-CM | POA: Diagnosis not present

## 2020-11-25 DIAGNOSIS — E861 Hypovolemia: Secondary | ICD-10-CM

## 2020-11-25 DIAGNOSIS — G4733 Obstructive sleep apnea (adult) (pediatric): Secondary | ICD-10-CM

## 2020-11-25 DIAGNOSIS — E785 Hyperlipidemia, unspecified: Secondary | ICD-10-CM | POA: Diagnosis present

## 2020-11-25 DIAGNOSIS — I959 Hypotension, unspecified: Secondary | ICD-10-CM

## 2020-11-25 DIAGNOSIS — E041 Nontoxic single thyroid nodule: Secondary | ICD-10-CM | POA: Diagnosis present

## 2020-11-25 DIAGNOSIS — K5900 Constipation, unspecified: Secondary | ICD-10-CM | POA: Diagnosis present

## 2020-11-25 DIAGNOSIS — J309 Allergic rhinitis, unspecified: Secondary | ICD-10-CM | POA: Diagnosis present

## 2020-11-25 DIAGNOSIS — K760 Fatty (change of) liver, not elsewhere classified: Secondary | ICD-10-CM | POA: Diagnosis present

## 2020-11-25 DIAGNOSIS — Z79899 Other long term (current) drug therapy: Secondary | ICD-10-CM | POA: Diagnosis not present

## 2020-11-25 DIAGNOSIS — I9589 Other hypotension: Secondary | ICD-10-CM | POA: Diagnosis not present

## 2020-11-25 DIAGNOSIS — J9601 Acute respiratory failure with hypoxia: Secondary | ICD-10-CM | POA: Diagnosis not present

## 2020-11-25 DIAGNOSIS — J984 Other disorders of lung: Secondary | ICD-10-CM

## 2020-11-25 DIAGNOSIS — J189 Pneumonia, unspecified organism: Secondary | ICD-10-CM | POA: Diagnosis present

## 2020-11-25 DIAGNOSIS — E042 Nontoxic multinodular goiter: Secondary | ICD-10-CM | POA: Diagnosis present

## 2020-11-25 DIAGNOSIS — Z6839 Body mass index (BMI) 39.0-39.9, adult: Secondary | ICD-10-CM | POA: Diagnosis not present

## 2020-11-25 DIAGNOSIS — E876 Hypokalemia: Secondary | ICD-10-CM | POA: Diagnosis not present

## 2020-11-25 DIAGNOSIS — Z8616 Personal history of COVID-19: Secondary | ICD-10-CM | POA: Diagnosis not present

## 2020-11-25 DIAGNOSIS — R112 Nausea with vomiting, unspecified: Secondary | ICD-10-CM

## 2020-11-25 DIAGNOSIS — I7121 Aneurysm of the ascending aorta, without rupture: Secondary | ICD-10-CM | POA: Diagnosis present

## 2020-11-25 DIAGNOSIS — I10 Essential (primary) hypertension: Secondary | ICD-10-CM | POA: Diagnosis present

## 2020-11-25 DIAGNOSIS — J9691 Respiratory failure, unspecified with hypoxia: Secondary | ICD-10-CM | POA: Diagnosis present

## 2020-11-25 DIAGNOSIS — Z20822 Contact with and (suspected) exposure to covid-19: Secondary | ICD-10-CM | POA: Diagnosis present

## 2020-11-25 DIAGNOSIS — K8010 Calculus of gallbladder with chronic cholecystitis without obstruction: Secondary | ICD-10-CM | POA: Diagnosis present

## 2020-11-25 DIAGNOSIS — Z7984 Long term (current) use of oral hypoglycemic drugs: Secondary | ICD-10-CM | POA: Diagnosis not present

## 2020-11-25 HISTORY — DX: Sepsis, unspecified organism: A41.9

## 2020-11-25 HISTORY — DX: Calculus of gallbladder without cholecystitis without obstruction: K80.20

## 2020-11-25 HISTORY — DX: Hypoxemia: R09.02

## 2020-11-25 HISTORY — DX: Syncope and collapse: R55

## 2020-11-25 LAB — HEMOGLOBIN A1C
Hgb A1c MFr Bld: 6.6 % — ABNORMAL HIGH (ref 4.8–5.6)
Mean Plasma Glucose: 142.72 mg/dL

## 2020-11-25 LAB — CBG MONITORING, ED
Glucose-Capillary: 179 mg/dL — ABNORMAL HIGH (ref 70–99)
Glucose-Capillary: 144 mg/dL — ABNORMAL HIGH (ref 70–99)
Glucose-Capillary: 143 mg/dL — ABNORMAL HIGH (ref 70–99)

## 2020-11-25 LAB — CBC
HCT: 44.4 % (ref 39.0–52.0)
Hemoglobin: 15.1 g/dL (ref 13.0–17.0)
MCH: 30.6 pg (ref 26.0–34.0)
MCHC: 34 g/dL (ref 30.0–36.0)
MCV: 90.1 fL (ref 80.0–100.0)
Platelets: 217 10*3/uL (ref 150–400)
RBC: 4.93 MIL/uL (ref 4.22–5.81)
RDW: 12.9 % (ref 11.5–15.5)
WBC: 15.1 10*3/uL — ABNORMAL HIGH (ref 4.0–10.5)
nRBC: 0 % (ref 0.0–0.2)

## 2020-11-25 LAB — URINALYSIS, ROUTINE W REFLEX MICROSCOPIC
Bilirubin Urine: NEGATIVE
Glucose, UA: NEGATIVE mg/dL
Hgb urine dipstick: NEGATIVE
Ketones, ur: 20 mg/dL — AB
Leukocytes,Ua: NEGATIVE
Nitrite: NEGATIVE
Protein, ur: NEGATIVE mg/dL
Specific Gravity, Urine: >1.046 — ABNORMAL HIGH (ref 1.005–1.030)
pH: 5 (ref 5.0–8.0)

## 2020-11-25 LAB — BASIC METABOLIC PANEL
Anion gap: 8 (ref 5–15)
BUN: 14 mg/dL (ref 6–20)
CO2: 21 mmol/L — ABNORMAL LOW (ref 22–32)
Calcium: 8.1 mg/dL — ABNORMAL LOW (ref 8.9–10.3)
Chloride: 106 mmol/L (ref 98–111)
Creatinine, Ser: 0.98 mg/dL (ref 0.61–1.24)
GFR, Estimated: >60 mL/min (ref >60)
Glucose, Bld: 155 mg/dL — ABNORMAL HIGH (ref 70–99)
Potassium: 4.1 mmol/L (ref 3.5–5.1)
Sodium: 135 mmol/L (ref 135–145)

## 2020-11-25 LAB — TROPONIN I (HIGH SENSITIVITY): Troponin I (High Sensitivity): 4 ng/L (ref <18)

## 2020-11-25 LAB — MRSA NEXT GEN BY PCR, NASAL: MRSA by PCR Next Gen: NOT DETECTED

## 2020-11-25 LAB — GLUCOSE, CAPILLARY: Glucose-Capillary: 126 mg/dL — ABNORMAL HIGH (ref 70–99)

## 2020-11-25 LAB — LACTIC ACID, PLASMA
Lactic Acid, Venous: 2 mmol/L (ref 0.5–1.9)
Lactic Acid, Venous: 1.3 mmol/L (ref 0.5–1.9)

## 2020-11-25 LAB — HIV ANTIBODY (ROUTINE TESTING W REFLEX): HIV Screen 4th Generation wRfx: NONREACTIVE

## 2020-11-25 LAB — ABO/RH: ABO/RH(D): A POS

## 2020-11-25 MED ORDER — GADOBUTROL 1 MMOL/ML IV SOLN
10.0000 mL | Freq: Once | INTRAVENOUS | Status: AC | PRN
  Administered 2020-11-25: 10 mL via INTRAVENOUS

## 2020-11-25 MED ORDER — ONDANSETRON HCL 4 MG/2ML IJ SOLN
4.0000 mg | Freq: Four times a day (QID) | INTRAMUSCULAR | Status: DC | PRN
  Administered 2020-11-27: 4 mg via INTRAVENOUS
  Filled 2020-11-25: qty 2

## 2020-11-25 MED ORDER — SODIUM CHLORIDE 0.9 % IV SOLN: 2.0000 g | Freq: Once | INTRAVENOUS | Status: DC

## 2020-11-25 MED ORDER — ALBUTEROL SULFATE (2.5 MG/3ML) 0.083% IN NEBU: 2.5000 mg | INHALATION_SOLUTION | RESPIRATORY_TRACT | Status: DC | PRN

## 2020-11-25 MED ORDER — ONDANSETRON HCL 4 MG PO TABS: 4.0000 mg | ORAL_TABLET | Freq: Four times a day (QID) | ORAL | Status: DC | PRN

## 2020-11-25 MED ORDER — ROSUVASTATIN CALCIUM 5 MG PO TABS
5.0000 mg | ORAL_TABLET | Freq: Every evening | ORAL | Status: DC
  Administered 2020-11-25 – 2020-11-26 (×2): 5 mg via ORAL
  Filled 2020-11-25 (×2): qty 1

## 2020-11-25 MED ORDER — ACETAMINOPHEN 650 MG RE SUPP: 650.0000 mg | Freq: Four times a day (QID) | RECTAL | Status: DC | PRN

## 2020-11-25 MED ORDER — INSULIN ASPART 100 UNIT/ML IJ SOLN
0.0000 [IU] | Freq: Three times a day (TID) | INTRAMUSCULAR | Status: DC
Start: 2020-11-25 — End: 2020-11-27
  Administered 2020-11-25 (×2): 2 [IU] via SUBCUTANEOUS
  Administered 2020-11-25: 3 [IU] via SUBCUTANEOUS
  Administered 2020-11-26 (×2): 2 [IU] via SUBCUTANEOUS
  Administered 2020-11-26: 3 [IU] via SUBCUTANEOUS
  Administered 2020-11-27: 2 [IU] via SUBCUTANEOUS

## 2020-11-25 MED ORDER — SODIUM CHLORIDE 0.9 % IV SOLN
2.0000 g | Freq: Three times a day (TID) | INTRAVENOUS | Status: DC
  Administered 2020-11-25: 2 g via INTRAVENOUS
  Filled 2020-11-25: qty 2

## 2020-11-25 MED ORDER — VANCOMYCIN HCL 1500 MG/300ML IV SOLN
1500.0000 mg | Freq: Two times a day (BID) | INTRAVENOUS | Status: DC
  Administered 2020-11-25: 1500 mg via INTRAVENOUS
  Filled 2020-11-25 (×2): qty 300

## 2020-11-25 MED ORDER — PIPERACILLIN-TAZOBACTAM 3.375 G IVPB
3.3750 g | Freq: Three times a day (TID) | INTRAVENOUS | Status: DC
  Administered 2020-11-26 (×2): 3.375 g via INTRAVENOUS
  Filled 2020-11-25 (×2): qty 50

## 2020-11-25 MED ORDER — VANCOMYCIN HCL 1000 MG/200ML IV SOLN: 1000.0000 mg | Freq: Once | INTRAVENOUS | Status: DC

## 2020-11-25 MED ORDER — PANTOPRAZOLE SODIUM 40 MG PO TBEC
40.0000 mg | DELAYED_RELEASE_TABLET | Freq: Every day | ORAL | Status: DC
  Administered 2020-11-26: 40 mg via ORAL
  Filled 2020-11-25: qty 1

## 2020-11-25 MED ORDER — ENOXAPARIN SODIUM 40 MG/0.4ML IJ SOSY
40.0000 mg | PREFILLED_SYRINGE | Freq: Every day | INTRAMUSCULAR | Status: DC
  Administered 2020-11-25 – 2020-11-26 (×2): 40 mg via SUBCUTANEOUS
  Filled 2020-11-25 (×2): qty 0.4

## 2020-11-25 MED ORDER — METRONIDAZOLE 500 MG/100ML IV SOLN
500.0000 mg | Freq: Two times a day (BID) | INTRAVENOUS | Status: DC
  Administered 2020-11-25: 500 mg via INTRAVENOUS
  Filled 2020-11-25: qty 100

## 2020-11-25 MED ORDER — LACTATED RINGERS IV SOLN: INTRAVENOUS | Status: DC

## 2020-11-25 MED ORDER — ACETAMINOPHEN 325 MG PO TABS: 650.0000 mg | ORAL_TABLET | Freq: Four times a day (QID) | ORAL | Status: DC | PRN

## 2020-11-25 NOTE — ED Notes (Signed)
No needs from pt at this time,

## 2020-11-25 NOTE — Progress Notes (Addendum)
PROGRESS NOTE    Sparsh Callens  KCL:[id number] DOB: 02-22-1965 DOA: 11/24/2020 PCP: Pleas Koch, NP    Brief Narrative:  Mr. Glasscock was admitted to the hospital with the working diagnosis of symptomatic cholelithiasis, complicated with syncope and pneumonitis.   55 year old male past medical history for hypertension, type 2 diabetes mellitus, dyslipidemia and obstructive sleep apnea who presented with syncope episode.  Reported several days of intermittent abdominal pain, epigastric, radiated to bilateral upper quadrants.  Associated with multiple episodes of vomiting, including 3 syncope episodes while vomiting.  After last syncope episode patient experienced dyspnea on exertion.  On his initial physical examination his oximetry was in the 80s on room air, heart rate 79, respiratory rate 23, blood pressure 106/71, on supplemental oxygen oximetry 94%.  His lungs had diminished breath sounds, bibasilar rales and no wheezing, heart S1-S2, present, rhythmic, abdomen diffusely tender, nondistended, mild lower extremity edema.  Sodium 136, potassium 3.4, chloride 105, bicarb 18, glucose 187, BUN 16, creatinine 1.0, lipase 26, AST 17, ALT 19.  Lactic acid 2.1, white count 13.8, hemoglobin 17.1, hematocrit 49.4, platelets 246. SARS COVID-19 negative.  Urine analysis specific gravity > 1.046, negative nitrates.  Negative leukocytes.  Chest radiograph negative for infiltrates. CT chest abdomen pelvis.  A sending aneurysm of the aorta 5.2 cm. Cholelithiasis without gallbladder thickening or biliary dilatation. 4.1 cm low-density lesion in the right hepatic lobe above the typical density of fluid but appears thin walled and uncomplicated.   Cholelithiasis without sonographic evidence of acute cholecystitis. Heterogeneous liver.  EKG 83 bpm, left axis deviation, normal intervals, sinus rhythm, no significant ST segment T wave changes.  Assessment & Plan:   Principal Problem:    Cholelithiases Active Problems:   Type 2 diabetes mellitus (H[facility])   Obstructive sleep apnea   Hypotension   Pneumonitis   Nausea and vomiting   Syncope   Hypoxemia   Suspected symptomatic cholelithiasis. Patient with nausea, vomiting and abdominal pian, mainly in the right upper quadrant. It has been complicated with vasovagal syncope and aspiration pneumonitis.   Today his abdominal pain has improved but not back to baseline, no nausea or vomiting. His oxygenation is 97% on 2 L/min per [place]  Plan to continue supportive medical care, including as needed antiacids, analgesics and antiemetics.  Sepsis ruled out.   Will check liver MRI to follow up on liver lesion as recommended per radiology. Will consult surgery for cholelithiasis, likely will need cholecystectomy.   Reactive leukocytosis, will continue with Zosyn for 24 hrs more, if further improvement in symptoms then will discontinue antibiotic therapy.   2. T2DM/ dyslipidemia fasting glucose is 155, patient is tolerating po well. Will continue glucose cover and monitoring with insulin sliding scale.  Continue with statin therapy.   3. HTN/ Ascending aortic aneurysm blood pressure has improved. 114/60 mmHg Will discontinue IV fluids for now.   Continue to hold on antihypertensive medications, at home patient on lisinopril and HCTZ.   4. 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  Calculated BMI is 39,5   Patient continue to be at high risk for recurrent abdominal pain.   Status is: Inpatient  Remains inpatient appropriate because: Supportive medical therapy.    DVT prophylaxis: Enoxaparin   Code Status:    full  Family Communication:  I spoke with patient's wife at the bedside, we talked in detail about patient's condition, plan of care and prognosis and all questions were addressed.     Subjective: Patient continue with abdominal pain but improved,  not back to baseline, no nausea or vomiting, no chest pain or dyspnea,.    Objective: Vitals:   11/25/20 0700 11/25/20 0800 11/25/20 0900 11/25/20 1000  BP: 107/60 123/64 110/75 122/79  Pulse: 96 (!) 103 94 92  Resp: 16 (!) 22 15 12   Temp:      TempSrc:      SpO2: 96% 97% 97% 99%  Weight:      Height:        Intake/Output Summary (Last 24 hours) at 11/25/2020 1246 Last data filed at 11/25/2020 0931 Gross per 24 hour  Intake 100 ml  Output 400 ml  Net -300 ml   Filed Weights   11/24/20 2137  Weight: 136.1 kg    Examination:   General: Not in pain or dyspnea Neurology: Awake and alert, non focal  E ENT: no pallor, no icterus, oral mucosa moist Cardiovascular: No JVD. S1-S2 present, rhythmic, no gallops, rubs, or murmurs. No lower extremity edema. Pulmonary: positive breath sounds bilaterally, adequate air movement, no wheezing, rhonchi or rales. Gastrointestinal. Abdomen protuberant, tender to deep palpation at the right upper quadrant, no rebound or guarding, murphy sign negative  Skin. No rashes Musculoskeletal: no joint deformities     Data Reviewed: I have personally reviewed following labs and imaging studies  CBC: Recent Labs  Lab 11/24/20 2154 11/24/20 2157 11/24/20 2158 11/25/20 0441  WBC 13.8*  --   --  15.1*  NEUTROABS 10.6*  --   --   --   HGB 17.1* 16.7 16.7 15.1  HCT 49.4 49.0 49.0 44.4  MCV 88.8  --   --  90.1  PLT 246  --   --  169   Basic Metabolic Panel: Recent Labs  Lab 11/24/20 2154 11/24/20 2157 11/24/20 2158 11/25/20 0441  NA 136 138 137 135  K 3.4* 3.4* 3.6 4.1  CL 105 105  --  106  CO2 18*  --   --  21*  GLUCOSE 187* 193*  --  155*  BUN 16 18  --  14  CREATININE 1.02 1.00  --  0.98  CALCIUM 8.4*  --   --  8.1*   GFR: Estimated Creatinine Clearance: 126.3 mL/min (by C-G formula based on SCr of 0.98 mg/dL). Liver Function Tests: Recent Labs  Lab 11/24/20 2154  AST 17  ALT 19  ALKPHOS 37*  BILITOT 0.9  PROT 6.2*  ALBUMIN 3.2*   Recent Labs  Lab 11/24/20 2154  LIPASE 26   No results  for input(s): AMMONIA in the last 168 hours. Coagulation Profile: Recent Labs  Lab 11/24/20 2154  INR 1.2   Cardiac Enzymes: No results for input(s): CKTOTAL, CKMB, CKMBINDEX, TROPONINI in the last 168 hours. BNP (last 3 results) No results for input(s): PROBNP in the last 8760 hours. HbA1C: Recent Labs    11/25/20 0442  HGBA1C 6.6*   CBG: Recent Labs  Lab 11/25/20 0820  GLUCAP 179*   Lipid Profile: No results for input(s): CHOL, HDL, LDLCALC, TRIG, CHOLHDL, LDLDIRECT in the last 72 hours. Thyroid Function Tests: No results for input(s): TSH, T4TOTAL, FREET4, T3FREE, THYROIDAB in the last 72 hours. Anemia Panel: No results for input(s): VITAMINB12, FOLATE, FERRITIN, TIBC, IRON, RETICCTPCT in the last 72 hours.    Radiology Studies: I have reviewed all of the imaging during this hospital visit personally     Scheduled Meds:  enoxaparin (LOVENOX) injection  40 mg Subcutaneous Daily   insulin aspart  0-15 Units Subcutaneous TID WC & HS  rosuvastatin  5 mg Oral QPM   Continuous Infusions:  ceFEPime (MAXIPIME) IV Stopped (11/25/20 0931)   lactated ringers 75 mL/hr at 11/25/20 1126   metronidazole Stopped (11/25/20 1157)   vancomycin 1,500 mg (11/25/20 1044)     LOS: 0 days        Glenette Bookwalter Gerome Apley, MD

## 2020-11-25 NOTE — Progress Notes (Signed)
Pharmacy Antibiotic Note  Antonio Horton is a 55 y.o. male with a history of hypertension, type 2 diabetes mellitus, dyslipidemia and obstructive sleep apnea. Patient presenting with syncope.  Zosyn consult placed for symptomatic cholelithiasis in the setting of syncope and pneumonitis. Patient started on cefepime, metronidazole, and vancomycin in the ED.  SCr 0.98 - at baseline WBC 13.8 >> 15.1; LA 2.1>>1.3; afeb  Plan: Zosyn 3.375g IV q8h (4 hour infusion) Trend WBC, Fever, Renal function, & Clinical course F/u cultures Levels at steady state De-escalate when able  Height: 6\' 1"  (185.4 cm) Weight: 136.1 kg (300 lb) IBW/kg (Calculated) : 79.9  Temp (24hrs), Avg:98.2 F (36.8 C), Min:97.8 F (36.6 C), Max:98.6 F (37 C)  Recent Labs  Lab 11/24/20 2154 11/24/20 2157 11/24/20 2354 11/25/20 0441 11/25/20 0443  WBC 13.8*  --   --  15.1*  --   CREATININE 1.02 1.00  --  0.98  --   LATICACIDVEN 2.1*  --  2.0*  --  1.3    Estimated Creatinine Clearance: 126.3 mL/min (by C-G formula based on SCr of 0.98 mg/dL).    No Known Allergies  Antimicrobials this admission: Cefepime 11/5 >> 11/6 Vancomycin 11/5 >> 11/6 Metronidazole 11/5>> 11/6 Zosyn 11/6 >>  Microbiology results: Pending  Thank you for allowing pharmacy to be a part of this patient's care.  Lorelei Pont, PharmD, BCPS 11/25/2020 1:49 PM ED Clinical Pharmacist -  [phone]

## 2020-11-25 NOTE — Plan of Care (Signed)
Discussed with patient in front of family plan of care for the evening, pain management, temperature and wearing CPAP tonight with some teach back displayed  Problem: Education: Goal: Knowledge of General Education information will improve Description: Including pain rating scale, medication(s)/side effects and non-pharmacologic comfort measures Outcome: Progressing   Problem: Activity: Goal: Risk for activity intolerance will decrease Outcome: Progressing

## 2020-11-25 NOTE — Progress Notes (Addendum)
Pharmacy Antibiotic Note  Antonio Horton is a 55 y.o. male admitted on 11/24/2020 with pneumonia and sepsis.  Pharmacy has been consulted for Vancomycin/Cefepime dosing. Pt in the ED with syncope. WBC elevated. Renal function ok.   Plan: Vancomycin 2000 mg IV x 1, then 1500 mg IV q12h >>>Estimated AUC: 532 Cefepime 2g IV q8h Trend WBC, temp, renal function  F/U infectious work-up Drug levels as indicated   Height: 6\' 1"  (185.4 cm) Weight: 136.1 kg (300 lb) IBW/kg (Calculated) : 79.9  Temp (24hrs), Avg:97.8 F (36.6 C), Min:97.8 F (36.6 C), Max:97.8 F (36.6 C)  Recent Labs  Lab 11/24/20 2154 11/24/20 2157  WBC 13.8*  --   CREATININE 1.02 1.00  LATICACIDVEN 2.1*  --     Estimated Creatinine Clearance: 123.7 mL/min (by C-G formula based on SCr of 1 mg/dL).    No Known Allergies  Narda Bonds, PharmD, BCPS Clinical Pharmacist Phone: [phone]

## 2020-11-25 NOTE — ED Notes (Signed)
Patient transported to MRI 

## 2020-11-25 NOTE — H&P (Signed)
History and Physical    Antonio Horton YOV:[id number] DOB: October 23, 1965 DOA: 11/24/2020  PCP: Pleas Koch, NP   Patient coming from: Home  Chief Complaint: Syncope  HPI: Antonio Horton is a 55 y.o. male with medical history significant for HTN, DMT2, OSA, HLD who presents by EMS after having 3 syncopal events at home.  He reports over the last week he has been having abdominal pain that will start in the epigastric region and going to the left upper quadrant and right lower quadrant regions.  He reports the pain has been worse since the morning of November 50 has not 22.  He reports he had multiple episodes of nausea and vomiting at home and 3 times he had syncopal episodes during the vomiting.  He states that just before he had the episodes he would feel very lightheaded but he did not have any numbness or weakness and had no slurred speech drooping face or vision change.  That the nausea vomiting started after he felt like something "popped" in his abdomen and the pain increased.  He reports he was feeling just generalized weakness and fatigue prior to that occurring.  He reports he had a subjective fever but did not take his temperature at home.  After the syncopal episode he started to have some mild shortness of breath that got worse with any exertion.  He states that when he would have a syncopal episode he would lose consciousness but did not have any tremors or seizure-like activity according to his wife who witnessed it.  After the third episode EMS was called.  Earlier in the day he did take some ibuprofen for his abdominal pain which did not help much but did not take any other medications for it.  He denies any chest pain or palpitations.  Reports his blood sugars have been running in the normal range and have generally been under 110.  He denies any recent travel.  He reports earlier in the day he walked around a street festival with his wife for a little while and did not have  any symptoms except for some intermittent abdominal pain at that time.  He denies having any raw or undercooked food ingestion.  He has not had any change in his diet.  He denies history of blood clots in the past.  He does use CPAP every night. Lives with his wife.  Denies tobacco or illicit drug use.  States he rarely drinks alcohol and has not had any in the last few months.  ED Course: Antonio Horton presented with low blood pressure that did improve with IVF bolus. He also had hypoxemia with O2 sat in the mid to upper 80s. He was placed on oxygen by n/c.  He had a CT angiography of the chest abdomen pelvis which showed pneumonitis in the bases of his lungs but no PE was identified.  A 4 cm density was identified in the right hepatic lobe which will need further work-up with nonemergent MRI of the liver to better characterize.  Liver also has some fatty liver changes on CT scan.  VBG showed a pH of 7.449 PCO2 32.2 PO2 of 115 bicarb 22.3.  Lactic acid 2.1 troponin 5 white blood cell count 13,800 hemoglobin 17.1 hematocrit 49.4 platelets 246,000.  Sodium 136 potassium 3.4 initially but repeat potassium was 3.6 on subsequent lab.  Chloride 105 bicarb 18 creatinine 1.02 BUN 16 calcium 8.4 alkaline phosphatase 37 AST 17 ALT 19 lipase 26 bilirubin 0.9.  INR  1.2.  Urinalysis is pending.  COVID swab negative.  Influenza A and B are negative.  Hospitalist service asked to admit for further management  Review of Systems:  General: Reports subjective fever, chills. Denies night sweats. Reports decreased appetite HENT: Denies head trauma, headache, denies change in hearing, tinnitus.  Denies nasal congestion or bleeding.  Denies sore throat.  Denies difficulty swallowing Eyes: Denies blurry vision, pain in eye, drainage.  Denies discoloration of eyes. Neck: Denies pain.  Denies swelling.  Denies pain with movement. Cardiovascular: Denies chest pain, palpitations.  Denies edema.  Denies orthopnea Respiratory: Reports  shortness of breath, cough.  Denies wheezing.  Denies sputum production Gastrointestinal: Reports abdominal pain, nausea, vomiting. Denies diarrhea.  Denies melena.  Denies hematemesis. Musculoskeletal: Denies limitation of movement.  Denies deformity or swelling.  Denies pain.  Genitourinary: Denies pelvic pain.  Denies urinary frequency or hesitancy.  Denies dysuria.  Skin: Denies rash.  Denies petechiae, purpura, ecchymosis. Neurological: Denies seizure activity. Denies paresthesia.  Denies slurred speech, drooping face.  Denies visual change. Psychiatric: Denies depression, anxiety.  Denies hallucinations.  Past Medical History:  Diagnosis Date   Borderline diabetes    Diabetes mellitus without complication ([place])    Hyperlipidemia    Hypertension    Sleep apnea    wears CPAP   Vasovagal reaction     Past Surgical History:  Procedure Laterality Date   EYE SURGERY     Corneal dystrophies    Social History  reports that he has never smoked. He has never used smokeless tobacco. He reports that he does not currently use alcohol. He reports that he does not use drugs.  No Known Allergies  Family History  Adopted: Yes  Problem Relation Age of Onset   Other Other        adopted     Prior to Admission medications   Medication Sig Start Date End Date Taking? Authorizing Provider  Continuous Blood Gluc Receiver (FREESTYLE LIBRE 14 DAY READER) DEVI 1 Device by Does not apply route 3 (three) times daily as needed. Patient not taking: Reported on 06/14/2020 12/27/18   Pleas Koch, NP  Continuous Blood Gluc Sensor (FREESTYLE LIBRE 14 DAY SENSOR) MISC 1 Device by Does not apply route 3 (three) times daily as needed. Patient not taking: Reported on 06/14/2020 01/24/19   Pleas Koch, NP  lisinopril-hydrochlorothiazide (ZESTORETIC) 10-12.5 MG tablet TAKE 1 TABLET BY MOUTH DAILY. FOR BLOOD PRESSURE. OFFICE VISIT REQUIRED FOR FURTHER REFILLS. 10/18/20   Pleas Koch, NP   metFORMIN (GLUCOPHAGE-XR) 500 MG 24 hr tablet Take 1 tablet (500 mg total) by mouth daily with breakfast. For diabetes. 03/28/20   Pleas Koch, NP  prednisoLONE acetate (PRED FORTE) 1 % ophthalmic suspension Place 1 drop into the left eye daily.  12/31/18   [provider]  rosuvastatin (CRESTOR) 5 MG tablet Take 1 tablet (5 mg total) by mouth every evening. For cholesterol. Office visit required for further refills. 10/17/20   Pleas Koch, NP    Physical Exam: Vitals:   11/24/20 2300 11/24/20 2312 11/24/20 2315 11/24/20 2330  BP: 106/71 105/67 118/74 111/67  Pulse: 79 83 84 88  Resp: 13 19 (!) 23 (!) 21  Temp:      TempSrc:      SpO2: 92% 94% 94% 95%  Weight:      Height:        Constitutional: NAD, calm, comfortable Vitals:   11/24/20 2300 11/24/20 2312 11/24/20 2315 11/24/20  2330  BP: 106/71 105/67 118/74 111/67  Pulse: 79 83 84 88  Resp: 13 19 (!) 23 (!) 21  Temp:      TempSrc:      SpO2: 92% 94% 94% 95%  Weight:      Height:       General: WDWN, Alert and oriented x3.  Eyes: EOMI, PERRL, conjunctivae normal.  Sclera nonicteric HENT:  Antonio Horton/AT, external ears normal.  Nares patent without epistasis.  Mucous membranes are dry. Posterior pharynx clear  Neck: Soft, normal range of motion, supple, no masses, Trachea midline Respiratory:  Equal but diminished breath sounds. Has bibasilar crackles and rales. no wheezing. Normal respiratory effort. No accessory muscle use.  Cardiovascular: Regular rate and rhythm, no murmurs / rubs / gallops. Mild lower extremity edema. 2+ pedal pulses. Abdomen: Soft, Diffuse tenderness, nondistended, no rebound or guarding.  No masses palpated. Morbidly obese. Bowel sounds normoactive Musculoskeletal: FROM. no cyanosis. No joint deformity upper and lower extremities. Normal muscle tone.  Skin: Warm, dry, intact no rashes, lesions, ulcers. No induration. Normal skin turgor. Neurologic: CN 2-12 grossly intact.  Normal speech.   Sensation intact, Strength 5/5 in all extremities.   Psychiatric: Normal judgment and insight. Normal mood.    Labs on Admission: I have personally reviewed following labs and imaging studies  CBC: Recent Labs  Lab 11/24/20 2154 11/24/20 2157 11/24/20 2158  WBC 13.8*  --   --   NEUTROABS 10.6*  --   --   HGB 17.1* 16.7 16.7  HCT 49.4 49.0 49.0  MCV 88.8  --   --   PLT 246  --   --     Basic Metabolic Panel: Recent Labs  Lab 11/24/20 2154 11/24/20 2157 11/24/20 2158  NA 136 138 137  K 3.4* 3.4* 3.6  CL 105 105  --   CO2 18*  --   --   GLUCOSE 187* 193*  --   BUN 16 18  --   CREATININE 1.02 1.00  --   CALCIUM 8.4*  --   --     GFR: Estimated Creatinine Clearance: 123.7 mL/min (by C-G formula based on SCr of 1 mg/dL).  Liver Function Tests: Recent Labs  Lab 11/24/20 2154  AST 17  ALT 19  ALKPHOS 37*  BILITOT 0.9  PROT 6.2*  ALBUMIN 3.2*    Urine analysis:    Component Value Date/Time   COLORURINE yellow 09/01/2006 0944   APPEARANCEUR Clear 09/01/2006 0944   LABSPEC 1.010 09/01/2006 0944   PHURINE 6.5 09/01/2006 0944   HGBUR negative 09/01/2006 0944   BILIRUBINUR negative 09/01/2006 0944   UROBILINOGEN negative 09/01/2006 0944   NITRITE negative 09/01/2006 0944    Radiological Exams on Admission: DG Chest Port 1 View  Result Date: 11/24/2020 CLINICAL DATA:  Sepsis. EXAM: PORTABLE CHEST 1 VIEW COMPARISON:  Chest CT dated 11/24/2020. FINDINGS: Mild eventration of the right hemidiaphragm. No focal consolidation, pleural effusion, pneumothorax. The cardiac silhouette is within normal limits. No acute osseous pathology. IMPRESSION: No active disease. Electronically Signed   By: Anner Crete M.D.   On: 11/24/2020 23:05   CT Angio Chest/Abd/Pel for Dissection W and/or Wo Contrast  Result Date: 11/24/2020 CLINICAL DATA:  Chest and abdomen pain with aortic dissection suspected. EXAM: CT ANGIOGRAPHY CHEST, ABDOMEN AND PELVIS TECHNIQUE: Non-contrast CT of  the chest was initially obtained. Multidetector CT imaging through the chest, abdomen and pelvis was performed using the standard protocol during bolus administration of intravenous contrast. Multiplanar reconstructed  images and MIPs were obtained and reviewed to evaluate the vascular anatomy. CONTRAST:  142mL OMNIPAQUE IOHEXOL 350 MG/ML SOLN COMPARISON:  None. FINDINGS: CTA CHEST FINDINGS Cardiovascular: Normal cardiac size. No arterial dilatation or central embolus. Mild calcification in the distal right coronary artery with trace calcification in the left-sided arteries. 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 The remainder is normal in caliber and the great vessels are clear. There is no pericardial effusion. Mediastinum/Nodes: There are small scattered thyroid nodules, the largest is 1.8 cm in the left lobe. There is no intrathoracic adenopathy, focal esophageal thickening, hiatal hernia or tracheobronchial filling defect. Lungs/Pleura: There is no pleural effusion, thickening or pneumothorax. There is an asymmetrical elevated right hemidiaphragm consistent with eventration or paresis. There are faint ground-glass opacities in the lower lobes which are probably microatelectasis, less likely pneumonitis. The remaining lungs are clear. 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 No suspicious bone lesions. Review of the MIP images confirms the above findings. CTA ABDOMEN AND PELVIS FINDINGS VASCULAR Aorta: Normal caliber aorta without aneurysm, dissection, vasculitis or significant stenosis. Celiac: Patent without evidence of aneurysm, dissection, vasculitis or significant stenosis. SMA: Patent without evidence of aneurysm, dissection, vasculitis or significant stenosis. Renals: Both renal arteries are patent without  evidence of aneurysm, dissection, vasculitis, fibromuscular dysplasia or significant stenosis. IMA: Patent without evidence of aneurysm, dissection, vasculitis or significant stenosis. Inflow: Patent without evidence of aneurysm, dissection, vasculitis or significant stenosis. Veins: No obvious venous abnormality within the limitations of this arterial phase study. Review of the MIP images confirms the above findings. NON-VASCULAR Hepatobiliary: Not well seen due to artifact from the patient's arms in the field. There are small stones in the gallbladder without wall thickening or biliary dilatation. There is a 4.1 cm low-density lesion medially in the right hepatic lobe posterior segment which measures 25 Hounsfield units above the typical density of fluid but appears thin walled and uncomplicated. The liver is mildly steatotic, without other obvious focal lesion. Appears to be a nodular surface contour in the left lobe which could suggest early cirrhosis. Pancreas: Unremarkable. Spleen: No mass enhancement or splenomegaly. Adrenals/Urinary Tract: There is no adrenal mass. No renal mass, calculus or hydronephrosis are seen. Stomach/Bowel: No dilatation or wall thickening, including the appendix. Constipation. Left colonic diverticula without evidence of acute diverticulitis Lymphatic: No enlarged abdominopelvic nodes. Reproductive: Normal prostate. Other: No free air, hemorrhage or fluid. Musculoskeletal: Advanced degenerative change lumbar spine. Multilevel lumbar foraminal stenosis. Review of the MIP images confirms the above findings. IMPRESSION: 1. No aortic dissection. 2. 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 Normal abdominal aorta and branch arteries. 3. Faint haziness in the lower lobes is most likely microatelectasis, less likely pneumonitis. Elevated right diaphragm. 4. Small amount of calcification in the distal right coronary artery with trace calcification in the left-sided  arteries. 5. Cholelithiasis without gallbladder thickening or biliary dilatation. 6. Question early changes of cirrhosis of liver. No splenomegaly or ascites. 7. 4.1 cm low-density lesion in the right hepatic lobe above the usual density of fluid. MRI recommended. 8. Constipation and diverticulosis. 9. 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 10. Thyroid nodules.  Routine ultrasound follow-up recommended. Electronically Signed   By: Telford Nab M.D.   On: 11/24/2020 23:08   US Abdomen Limited RUQ (LIVER/GB)  Result Date:  11/25/2020 CLINICAL DATA:  Right upper quadrant abdominal pain. EXAM: ULTRASOUND ABDOMEN LIMITED RIGHT UPPER QUADRANT COMPARISON:  CT dated 11/24/2020. FINDINGS: Evaluation is very limited due to body habitus and overlying bowel gas. Gallbladder: There is sludge and stone within the gallbladder. There is no gallbladder wall thickening or pericholecystic fluid. Negative sonographic Murphy's sign. Common bile duct: Diameter: 5 mm Liver: The liver demonstrates a heterogeneous echotexture. Ill-defined echogenic area in the right lobe of the liver measuring approximately 10 x 6 x 7 cm is not evaluated and poorly visualized. No corresponding mass noted on the CT. Portal vein is patent on color Doppler imaging with normal direction of blood flow towards the liver. Other: None. IMPRESSION: 1. Cholelithiasis without sonographic evidence of acute cholecystitis. 2. Heterogeneous liver. Electronically Signed   By: Anner Crete M.D.   On: 11/25/2020 00:17    EKG: Independently reviewed.  EKG shows normal sinus rhythm with no acute ST elevation or depression.  QTc 414  Assessment/Plan Principal Problem:   Sepsis  Antonio Horton is admitted to Progressive Care unit. Has sepsis by criteria of pneumonitis, hypotension, leukocytosis and tachypnea.  Urinalysis is pending. Patient is placed on empiric antibiotics with cefepime, Flagyl and vancomycin per sepsis protocol. Was given an IV fluid bolus  in the emergency room.  We will continue IV fluids with LR at 125 ml/hr Cultures were obtained and will be monitored.  Antibiotic to be adjusted as indicated Supportive care provided.  Active Problems:   Pneumonitis Patient with first atelectasis on CT angiography of his chest.  With patient having leukocytosis subjective fever with hypotension is most likely this is a pneumonitis.  He did have nausea and vomiting, may have aspirated small amount of vomitus which led to pneumonitis.    Hypotension Blood pressure has improved with IV fluid hydration in the emergency room.  We will continue IV fluids and monitor blood pressure.  Hold patient's antihypertensive medications    Hypoxemia Supplemental oxygen by nasal cannula to keep O2 sat between 92 to 96%. Incentive spirometry every 2 hours while awake for pulmonary toilet    Syncope Patient reports he had 3 syncopal episodes at home which occurred while he was having nausea and vomiting.  Most likely was a vasovagal reaction.  We will continue to monitor on telemetry for any arrhythmia.    Type 2 diabetes mellitus  Hold metformin for 48 hours after having IV dye for CT angiography. Hemoglobin A1c.  He reports his last A1c was 4 months ago and was 5.3 Monitor blood sugars with meals and at bedtime.  Corrective insulin provide as needed for glycemic control.    Nausea and vomiting Zofran provided for antiemetic.  IV fluid hydration.    Obstructive sleep apnea Continue CPAP at night which he uses at home    DVT prophylaxis: Lovenox for DVT prophylaxis.   Code Status:   Full Code  Family Communication:  Diagnosis and plan discussed with patient.  He verbalized understanding and agrees with plan.  Further recommendations to follow as clinical indicated Disposition Plan:   Patient is from:  Home  Anticipated DC to:  Home  Anticipated DC date:  Anticipate 2 midnight or more stay in the hospital   Admission status:  Inpatient  Yevonne Aline  Amarah Brossman MD [facility]  How to contact the [facility] Attending or Consulting provider [place] or covering provider during after hours [place], for this patient?   Check the care team in [facility] and look for  a) attending/consulting [place] provider listed and b) the [facility] team listed Log into [email or web address] and use [place]'s universal password to access. If you do not have the password, please contact the hospital operator. Locate the [facility] provider you are looking for under [facility] and page to a number that you can be directly reached. If you still have difficulty reaching the provider, please page the [facility] (Director on Call) for the Hospitalists listed on amion for assistance.  11/25/2020, 12:30 AM

## 2020-11-26 DIAGNOSIS — G4733 Obstructive sleep apnea (adult) (pediatric): Secondary | ICD-10-CM | POA: Diagnosis not present

## 2020-11-26 DIAGNOSIS — I9589 Other hypotension: Secondary | ICD-10-CM | POA: Diagnosis not present

## 2020-11-26 DIAGNOSIS — R0902 Hypoxemia: Secondary | ICD-10-CM | POA: Diagnosis not present

## 2020-11-26 DIAGNOSIS — K8020 Calculus of gallbladder without cholecystitis without obstruction: Secondary | ICD-10-CM | POA: Diagnosis not present

## 2020-11-26 LAB — CBC
HCT: 41.2 % (ref 39.0–52.0)
Hemoglobin: 14.1 g/dL (ref 13.0–17.0)
MCH: 30.8 pg (ref 26.0–34.0)
MCHC: 34.2 g/dL (ref 30.0–36.0)
MCV: 90 fL (ref 80.0–100.0)
Platelets: 183 10*3/uL (ref 150–400)
RBC: 4.58 MIL/uL (ref 4.22–5.81)
RDW: 13.1 % (ref 11.5–15.5)
WBC: 10.3 10*3/uL (ref 4.0–10.5)
nRBC: 0 % (ref 0.0–0.2)

## 2020-11-26 LAB — SURGICAL PCR SCREEN
MRSA, PCR: NEGATIVE
Staphylococcus aureus: NEGATIVE

## 2020-11-26 LAB — GLUCOSE, CAPILLARY
Glucose-Capillary: 120 mg/dL — ABNORMAL HIGH (ref 70–99)
Glucose-Capillary: 124 mg/dL — ABNORMAL HIGH (ref 70–99)
Glucose-Capillary: 122 mg/dL — ABNORMAL HIGH (ref 70–99)
Glucose-Capillary: 162 mg/dL — ABNORMAL HIGH (ref 70–99)

## 2020-11-26 LAB — BASIC METABOLIC PANEL
Anion gap: 5 (ref 5–15)
BUN: 10 mg/dL (ref 6–20)
CO2: 27 mmol/L (ref 22–32)
Calcium: 8.4 mg/dL — ABNORMAL LOW (ref 8.9–10.3)
Chloride: 106 mmol/L (ref 98–111)
Creatinine, Ser: 1.05 mg/dL (ref 0.61–1.24)
GFR, Estimated: >60 mL/min (ref >60)
Glucose, Bld: 137 mg/dL — ABNORMAL HIGH (ref 70–99)
Potassium: 3.3 mmol/L — ABNORMAL LOW (ref 3.5–5.1)
Sodium: 138 mmol/L (ref 135–145)

## 2020-11-26 LAB — MAGNESIUM: Magnesium: 1.7 mg/dL (ref 1.7–2.4)

## 2020-11-26 LAB — URINE CULTURE: Culture: NO GROWTH

## 2020-11-26 MED ORDER — DOCUSATE SODIUM 100 MG PO CAPS
100.0000 mg | ORAL_CAPSULE | Freq: Two times a day (BID) | ORAL | Status: DC
  Administered 2020-11-26: 100 mg via ORAL
  Filled 2020-11-26: qty 1

## 2020-11-26 MED ORDER — POLYETHYLENE GLYCOL 3350 17 G PO PACK: 17.0000 g | PACK | Freq: Every day | ORAL | Status: DC

## 2020-11-26 MED ORDER — CEFAZOLIN IN SODIUM CHLORIDE 3-0.9 GM/100ML-% IV SOLN
3.0000 g | INTRAVENOUS | Status: AC
  Administered 2020-11-27: 3 g via INTRAVENOUS
  Filled 2020-11-26 (×2): qty 100

## 2020-11-26 MED ORDER — POTASSIUM CHLORIDE CRYS ER 20 MEQ PO TBCR
40.0000 meq | EXTENDED_RELEASE_TABLET | Freq: Once | ORAL | Status: AC
  Administered 2020-11-26: 40 meq via ORAL
  Filled 2020-11-26: qty 2

## 2020-11-26 NOTE — Progress Notes (Signed)
[facility] night cross cover note:  I was notified by RN of serum potassium level 3.3 this morning, with RN also conveying that the patient is able to take p.o.  Subsequently, I placed order for potassium chloride 40 mEq p.o. x1 dose now.     Babs Bertin, DO Hospitalist

## 2020-11-26 NOTE — Consult Note (Signed)
Consult Note  Antonio Horton 01/13/1966  [id number].    Requesting MD: Tawni Millers, MD Chief Complaint/Reason for Consult: RUQ pain and nausea/vomiting  HPI:  Patient is a 55 year old male who presented to [facility] via EMS yesterday after 3 syncopal events at home. Reports epigastric abdominal pain over the last week with nausea and vomiting that started Saturday. Patient reports that pain is in epigastrium and then radiates over upper abdomen. He reports pain is intermittent and currently is mild. No nausea or vomiting since Saturday and is currently tolerating a diet. Denies fever, chills, chest pain, SOB, urinary symptoms, change in bowel habits. No prior abdominal surgery. PMH otherwise significant for HTN, HLD, T2DM, OSA, and hx of vasovagal syncope. No blood thinning medications and NKDA. Patient reports rare alcohol use and denies tobacco or illicit drug use. He works from home and does not do a lot of physical labor for his job. His wife and daughter are at bedside.   ROS: Review of Systems  Constitutional:  Negative for chills and fever.  Respiratory:  Negative for shortness of breath and wheezing.   Cardiovascular:  Negative for chest pain and palpitations.  Gastrointestinal:  Positive for abdominal pain, nausea and vomiting. Negative for blood in stool, constipation, diarrhea and melena.  Genitourinary:  Negative for dysuria, frequency and urgency.  All other systems reviewed and are negative.  Family History  Adopted: Yes  Problem Relation Age of Onset   Other Other        adopted    Past Medical History:  Diagnosis Date   Borderline diabetes    Diabetes mellitus without complication ([place])    Hyperlipidemia    Hypertension    Sleep apnea    wears CPAP   Vasovagal reaction     Past Surgical History:  Procedure Laterality Date   EYE SURGERY     Corneal dystrophies    Social History:  reports that he has never smoked. He has never used  smokeless tobacco. He reports that he does not currently use alcohol. He reports that he does not use drugs.  Allergies: No Known Allergies  Medications Prior to Admission  Medication Sig Dispense Refill   lisinopril-hydrochlorothiazide (ZESTORETIC) 10-12.5 MG tablet TAKE 1 TABLET BY MOUTH DAILY. FOR BLOOD PRESSURE. OFFICE VISIT REQUIRED FOR FURTHER REFILLS. 30 tablet 0   metFORMIN (GLUCOPHAGE-XR) 500 MG 24 hr tablet Take 1 tablet (500 mg total) by mouth daily with breakfast. For diabetes. 90 tablet 1   prednisoLONE acetate (PRED FORTE) 1 % ophthalmic suspension Place 1 drop into the left eye daily.      rosuvastatin (CRESTOR) 5 MG tablet Take 1 tablet (5 mg total) by mouth every evening. For cholesterol. Office visit required for further refills. 90 tablet 0   Continuous Blood Gluc Receiver (FREESTYLE LIBRE 14 DAY READER) DEVI 1 Device by Does not apply route 3 (three) times daily as needed. (Patient not taking: No sig reported) 1 each 0   Continuous Blood Gluc Sensor (FREESTYLE LIBRE 14 DAY SENSOR) MISC 1 Device by Does not apply route 3 (three) times daily as needed. (Patient not taking: No sig reported) 2 each 5    Blood pressure 126/86, pulse 88, temperature 98.3 F (36.8 C), temperature source Oral, resp. rate 19, height 6\' 1"  (1.854 m), weight (!) 141.9 kg, SpO2 90 %. Physical Exam:  General: pleasant, WD, obese male who is laying in bed in NAD HEENT: head is normocephalic, atraumatic.  Sclera  are anicteric.  Ears and nose without any masses or lesions.  Mouth is pink and moist Heart: regular, rate, and rhythm.  Normal s1,s2. No obvious murmurs, gallops, or rubs noted.  Palpable radial and pedal pulses bilaterally Lungs: CTAB, no wheezes, rhonchi, or rales noted.  Respiratory effort nonlabored Abd: soft, NT, ND, +BS, no masses, hernias, or organomegaly MS: all 4 extremities are symmetrical with no cyanosis, clubbing, or edema. Skin: warm and dry with no masses, lesions, or  rashes Neuro: Cranial nerves 2-12 grossly intact, sensation is normal throughout Psych: A&Ox3 with an appropriate affect.   Results for orders placed or performed during the hospital encounter of 11/24/20 (from the past 48 hour(s))  Lactic acid, plasma     Status: Abnormal   Collection Time: 11/24/20  9:54 PM  Result Value Ref Range   Lactic Acid, Venous 2.1 (HH) 0.5 - 1.9 mmol/L    Comment: CRITICAL RESULT CALLED TO, READ BACK BY AND VERIFIED WITH: OSORIO B,RN 11/24/20 2239 WAYK Performed at [facility] [address] [place], [place] [address]   Comprehensive metabolic panel     Status: Abnormal   Collection Time: 11/24/20  9:54 PM  Result Value Ref Range   Sodium 136 135 - 145 mmol/L   Potassium 3.4 (L) 3.5 - 5.1 mmol/L   Chloride 105 98 - 111 mmol/L   CO2 18 (L) 22 - 32 mmol/L   Glucose, Bld 187 (H) 70 - 99 mg/dL    Comment: Glucose reference range applies only to samples taken after fasting for at least 8 hours.   BUN 16 6 - 20 mg/dL   Creatinine, Ser 1.02 0.61 - 1.24 mg/dL   Calcium 8.4 (L) 8.9 - 10.3 mg/dL   Total Protein 6.2 (L) 6.5 - 8.1 g/dL   Albumin 3.2 (L) 3.5 - 5.0 g/dL   AST 17 15 - 41 U/L   ALT 19 0 - 44 U/L   Alkaline Phosphatase 37 (L) 38 - 126 U/L   Total Bilirubin 0.9 0.3 - 1.2 mg/dL   GFR, Estimated >60 >60 mL/min    Comment: (NOTE) Calculated using the CKD-EPI Creatinine Equation (2021)    Anion gap 13 5 - 15    Comment: Performed at [facility], [place] [address], [place], [place] [address]  CBC WITH DIFFERENTIAL     Status: Abnormal   Collection Time: 11/24/20  9:54 PM  Result Value Ref Range   WBC 13.8 (H) 4.0 - 10.5 K/uL   RBC 5.56 4.22 - 5.81 MIL/uL   Hemoglobin 17.1 (H) 13.0 - 17.0 g/dL   HCT 49.4 39.0 - 52.0 %   MCV 88.8 80.0 - 100.0 fL   MCH 30.8 26.0 - 34.0 pg   MCHC 34.6 30.0 - 36.0 g/dL   RDW 12.5 11.5 - 15.5 %   Platelets 246 150 - 400 K/uL   nRBC 0.0 0.0 - 0.2 %   Neutrophils Relative % 77 %   Neutro Abs 10.6 (H) 1.7  - 7.7 K/uL   Lymphocytes Relative 15 %   Lymphs Abs 2.0 0.7 - 4.0 K/uL   Monocytes Relative 6 %   Monocytes Absolute 0.9 0.1 - 1.0 K/uL   Eosinophils Relative 2 %   Eosinophils Absolute 0.2 0.0 - 0.5 K/uL   Basophils Relative 0 %   Basophils Absolute 0.0 0.0 - 0.1 K/uL   Immature Granulocytes 0 %   Abs Immature Granulocytes 0.05 0.00 - 0.07 K/uL    Comment:  Performed at [facility], [place] [address] [place], [place] [address]  Protime-INR     Status: None   Collection Time: 11/24/20  9:54 PM  Result Value Ref Range   Prothrombin Time 15.0 11.4 - 15.2 seconds   INR 1.2 0.8 - 1.2    Comment: (NOTE) INR goal varies based on device and disease states. Performed at [facility], [place] [address] [place], [place] [address]   APTT     Status: None   Collection Time: 11/24/20  9:54 PM  Result Value Ref Range   aPTT 29 24 - 36 seconds    Comment: Performed at [facility] [address], [place], [place] [address]  Urinalysis, Routine w reflex microscopic Urine, Clean Catch     Status: Abnormal   Collection Time: 11/24/20  9:54 PM  Result Value Ref Range   Color, Urine YELLOW YELLOW   APPearance CLEAR CLEAR   Specific Gravity, Urine >1.046 (H) 1.005 - 1.030   pH 5.0 5.0 - 8.0   Glucose, UA NEGATIVE NEGATIVE mg/dL   Hgb urine dipstick NEGATIVE NEGATIVE   Bilirubin Urine NEGATIVE NEGATIVE   Ketones, ur 20 (A) NEGATIVE mg/dL   Protein, ur NEGATIVE NEGATIVE mg/dL   Nitrite NEGATIVE NEGATIVE   Leukocytes,Ua NEGATIVE NEGATIVE    Comment: Performed at [facility] [address] [place], [place] [address]  Urine Culture     Status: None   Collection Time: 11/24/20  9:54 PM   Specimen: In/Out Cath Urine  Result Value Ref Range   Specimen Description IN/OUT CATH URINE    Special Requests NONE    Culture      NO GROWTH Performed at [facility], [place] [address], [place], [place] [address]    Report Status 11/26/2020 FINAL   Troponin I (High  Sensitivity)     Status: None   Collection Time: 11/24/20  9:54 PM  Result Value Ref Range   Troponin I (High Sensitivity) 5 <18 ng/L    Comment: (NOTE) Elevated high sensitivity troponin I (hsTnI) values and significant  changes across serial measurements may suggest ACS but many other  chronic and acute conditions are known to elevate hsTnI results.  Refer to the "Links" section for chest pain algorithms and additional  guidance. Performed at [facility], [place] [address] [place], [place] [address]   Lipase, blood     Status: None   Collection Time: 11/24/20  9:54 PM  Result Value Ref Range   Lipase 26 11 - 51 U/L    Comment: Performed at [facility] [address], [place], [place] [address]  ABO/Rh     Status: None   Collection Time: 11/24/20  9:54 PM  Result Value Ref Range   ABO/RH(D)      A POS Performed at [facility] [address] [place], [place] [address]   I-stat chem 8, ED (not at [facility] or [facility])     Status: Abnormal   Collection Time: 11/24/20  9:57 PM  Result Value Ref Range   Sodium 138 135 - 145 mmol/L   Potassium 3.4 (L) 3.5 - 5.1 mmol/L   Chloride 105 98 - 111 mmol/L   BUN 18 6 - 20 mg/dL   Creatinine, Ser 1.00 0.61 - 1.24 mg/dL   Glucose, Bld 193 (H) 70 - 99 mg/dL    Comment: Glucose reference range applies only to samples taken after fasting  for at least 8 hours.   Calcium, Ion 1.02 (L) 1.15 - 1.40 mmol/L   TCO2 21 (L) 22 - 32 mmol/L   Hemoglobin 16.7 13.0 - 17.0 g/dL   HCT 49.0 39.0 - 52.0 %  I-Stat venous blood gas, ED     Status: Abnormal   Collection Time: 11/24/20  9:58 PM  Result Value Ref Range   pH, Ven 7.449 (H) 7.250 - 7.430   pCO2, Ven 32.2 (L) 44.0 - 60.0 mmHg   pO2, Ven 115.0 (H) 32.0 - 45.0 mmHg   Bicarbonate 22.3 20.0 - 28.0 mmol/L   TCO2 23 22 - 32 mmol/L   O2 Saturation 99.0 %   Acid-base deficit 1.0 0.0 - 2.0 mmol/L   Sodium 137 135 - 145 mmol/L   Potassium 3.6 3.5 - 5.1 mmol/L   Calcium, Ion 0.99 (L) 1.15  - 1.40 mmol/L   HCT 49.0 39.0 - 52.0 %   Hemoglobin 16.7 13.0 - 17.0 g/dL   Sample type VENOUS   Blood Culture (routine x 2)     Status: None (Preliminary result)   Collection Time: 11/24/20 10:12 PM   Specimen: BLOOD RIGHT FOREARM  Result Value Ref Range   Specimen Description BLOOD RIGHT FOREARM    Special Requests      BOTTLES DRAWN AEROBIC AND ANAEROBIC Blood Culture results may not be optimal due to an inadequate volume of blood received in culture bottles   Culture      NO GROWTH 2 DAYS Performed at [facility], [place] [address] [place], [place] [address]    Report Status PENDING   Type and screen [place]     Status: None   Collection Time: 11/24/20 10:42 PM  Result Value Ref Range   ABO/RH(D) A POS    Antibody Screen NEG    Sample Expiration      11/27/2020,2359 Performed at [facility], [place] [address], [place], [place] [address]   Blood Culture (routine x 2)     Status: None (Preliminary result)   Collection Time: 11/24/20 10:43 PM   Specimen: BLOOD  Result Value Ref Range   Specimen Description BLOOD RIGHT ANTECUBITAL    Special Requests      BOTTLES DRAWN AEROBIC AND ANAEROBIC Blood Culture adequate volume   Culture      NO GROWTH 2 DAYS Performed at [facility], [place] [address], [place], [place] [address]    Report Status PENDING   Resp Panel by RT-PCR (Flu A&B, Covid) Nasopharyngeal Swab     Status: None   Collection Time: 11/24/20 10:52 PM   Specimen: Nasopharyngeal Swab; Nasopharyngeal(NP) swabs in vial transport medium  Result Value Ref Range   SARS Coronavirus 2 by RT PCR NEGATIVE NEGATIVE    Comment: (NOTE) SARS-CoV-2 target nucleic acids are NOT DETECTED.  The SARS-CoV-2 RNA is generally detectable in upper respiratory specimens during the acute phase of infection. The lowest concentration of SARS-CoV-2 viral copies this assay can detect is 138 copies/mL. A negative result does not preclude SARS-Cov-2 infection  and should not be used as the sole basis for treatment or other patient management decisions. A negative result may occur with  improper specimen collection/handling, submission of specimen other than nasopharyngeal swab, presence of viral mutation(s) within the areas targeted by this assay, and inadequate number of viral copies(<138 copies/mL). A negative result must be combined with clinical observations, patient history, and epidemiological information. The expected result is Negative.  Fact Sheet for Patients:  EntrepreneurPulse.com.au  Fact Sheet for Healthcare Providers:  IncredibleEmployment.be  This test is no t yet approved or cleared by the [place] FDA and  has been authorized for detection and/or diagnosis of SARS-CoV-2 by FDA under an Emergency Use Authorization (EUA). This EUA will remain  in effect (meaning this test can be used) for the duration of the COVID-19 declaration under Section 564(b)(1) of the Act, 21 U.S.C.section 360bbb-3(b)(1), unless the authorization is terminated  or revoked sooner.       Influenza A by PCR NEGATIVE NEGATIVE   Influenza B by PCR NEGATIVE NEGATIVE    Comment: (NOTE) The Xpert Xpress SARS-CoV-2/FLU/RSV plus assay is intended as an aid in the diagnosis of influenza from Nasopharyngeal swab specimens and should not be used as a sole basis for treatment. Nasal washings and aspirates are unacceptable for Xpert Xpress SARS-CoV-2/FLU/RSV testing.  Fact Sheet for Patients: EntrepreneurPulse.com.au  Fact Sheet for Healthcare Providers: IncredibleEmployment.be  This test is not yet approved or cleared by the [place] FDA and has been authorized for detection and/or diagnosis of SARS-CoV-2 by FDA under an Emergency Use Authorization (EUA). This EUA will remain in effect (meaning this test can be used) for the duration of the COVID-19 declaration under  Section 564(b)(1) of the Act, 21 U.S.C. section 360bbb-3(b)(1), unless the authorization is terminated or revoked.  Performed at [facility], [place] [address], [place], [place] [address]   Lactic acid, plasma     Status: Abnormal   Collection Time: 11/24/20 11:54 PM  Result Value Ref Range   Lactic Acid, Venous 2.0 (HH) 0.5 - 1.9 mmol/L    Comment: CRITICAL VALUE NOTED.  VALUE IS CONSISTENT WITH PREVIOUSLY REPORTED AND CALLED VALUE. Performed at [facility], [place] [address] [place], [place] [address]   Troponin I (High Sensitivity)     Status: None   Collection Time: 11/24/20 11:54 PM  Result Value Ref Range   Troponin I (High Sensitivity) 4 <18 ng/L    Comment: (NOTE) Elevated high sensitivity troponin I (hsTnI) values and significant  changes across serial measurements may suggest ACS but many other  chronic and acute conditions are known to elevate hsTnI results.  Refer to the "Links" section for chest pain algorithms and additional  guidance. Performed at [facility], [place] [address], [place], [place] [address]   HIV Antibody (routine testing w rflx)     Status: None   Collection Time: 11/25/20  4:41 AM  Result Value Ref Range   HIV Screen 4th Generation wRfx Non Reactive Non Reactive    Comment: Performed at [facility], [place] [address] [place], [place] [address]  Basic metabolic panel     Status: Abnormal   Collection Time: 11/25/20  4:41 AM  Result Value Ref Range   Sodium 135 135 - 145 mmol/L   Potassium 4.1 3.5 - 5.1 mmol/L   Chloride 106 98 - 111 mmol/L   CO2 21 (L) 22 - 32 mmol/L   Glucose, Bld 155 (H) 70 - 99 mg/dL    Comment: Glucose reference range applies only to samples taken after fasting for at least 8 hours.   BUN 14 6 - 20 mg/dL   Creatinine, Ser 0.98 0.61 - 1.24 mg/dL   Calcium 8.1 (L) 8.9 - 10.3 mg/dL   GFR, Estimated >60 >60 mL/min    Comment: (NOTE) Calculated using the CKD-EPI Creatinine Equation (2021)    Anion gap 8  5  - 15    Comment: Performed at [facility], [place] [address] [place], [place] [address]  CBC     Status: Abnormal   Collection Time: 11/25/20  4:41 AM  Result Value Ref Range   WBC 15.1 (H) 4.0 - 10.5 K/uL   RBC 4.93 4.22 - 5.81 MIL/uL   Hemoglobin 15.1 13.0 - 17.0 g/dL   HCT 44.4 39.0 - 52.0 %   MCV 90.1 80.0 - 100.0 fL   MCH 30.6 26.0 - 34.0 pg   MCHC 34.0 30.0 - 36.0 g/dL   RDW 12.9 11.5 - 15.5 %   Platelets 217 150 - 400 K/uL   nRBC 0.0 0.0 - 0.2 %    Comment: Performed at [facility], [place] [address] [place], [place] [address]  Hemoglobin A1c     Status: Abnormal   Collection Time: 11/25/20  4:42 AM  Result Value Ref Range   Hgb A1c MFr Bld 6.6 (H) 4.8 - 5.6 %    Comment: (NOTE) Pre diabetes:          5.7%-6.4%  Diabetes:              >6.4%  Glycemic control for   <7.0% adults with diabetes    Mean Plasma Glucose 142.72 mg/dL    Comment: Performed at [facility] [address], [name], [place] [address]  Lactic acid, plasma     Status: None   Collection Time: 11/25/20  4:43 AM  Result Value Ref Range   Lactic Acid, Venous 1.3 0.5 - 1.9 mmol/L    Comment: Performed at [facility] [name] [place], [place] [address]  CBG monitoring, ED     Status: Abnormal   Collection Time: 11/25/20  8:20 AM  Result Value Ref Range   Glucose-Capillary 179 (H) 70 - 99 mg/dL    Comment: Glucose reference range applies only to samples taken after fasting for at least 8 hours.  CBG monitoring, ED     Status: Abnormal   Collection Time: 11/25/20  2:09 PM  Result Value Ref Range   Glucose-Capillary 144 (H) 70 - 99 mg/dL    Comment: Glucose reference range applies only to samples taken after fasting for at least 8 hours.  CBG monitoring, ED     Status: Abnormal   Collection Time: 11/25/20  4:47 PM  Result Value Ref Range   Glucose-Capillary 143 (H) 70 - 99 mg/dL    Comment: Glucose reference range applies only to samples taken after fasting for at  least 8 hours.  MRSA Next Gen by PCR, Nasal     Status: None   Collection Time: 11/25/20  9:02 PM   Specimen: Nasal Mucosa; Nasal Swab  Result Value Ref Range   MRSA by PCR Next Gen NOT DETECTED NOT DETECTED    Comment: (NOTE) The GeneXpert MRSA Assay (FDA approved for NASAL specimens only), is one component of a comprehensive MRSA colonization surveillance program. It is not intended to diagnose MRSA infection nor to guide or monitor treatment for MRSA infections. Test performance is not FDA approved in patients less than 24 years old. Performed at [facility], [place] [address] [place],  [address]   Glucose, capillary     Status: Abnormal   Collection Time: 11/25/20  9:24 PM  Result Value Ref Range   Glucose-Capillary 126 (H) 70 - 99 mg/dL    Comment: Glucose reference range applies only to  samples taken after fasting for at least 8 hours.  CBC     Status: None   Collection Time: 11/26/20  2:01 AM  Result Value Ref Range   WBC 10.3 4.0 - 10.5 K/uL   RBC 4.58 4.22 - 5.81 MIL/uL   Hemoglobin 14.1 13.0 - 17.0 g/dL   HCT 41.2 39.0 - 52.0 %   MCV 90.0 80.0 - 100.0 fL   MCH 30.8 26.0 - 34.0 pg   MCHC 34.2 30.0 - 36.0 g/dL   RDW 13.1 11.5 - 15.5 %   Platelets 183 150 - 400 K/uL   nRBC 0.0 0.0 - 0.2 %    Comment: Performed at [facility], [place] [address] [place], [place] [address]  Basic metabolic panel     Status: Abnormal   Collection Time: 11/26/20  2:01 AM  Result Value Ref Range   Sodium 138 135 - 145 mmol/L   Potassium 3.3 (L) 3.5 - 5.1 mmol/L   Chloride 106 98 - 111 mmol/L   CO2 27 22 - 32 mmol/L   Glucose, Bld 137 (H) 70 - 99 mg/dL    Comment: Glucose reference range applies only to samples taken after fasting for at least 8 hours.   BUN 10 6 - 20 mg/dL   Creatinine, Ser 1.05 0.61 - 1.24 mg/dL   Calcium 8.4 (L) 8.9 - 10.3 mg/dL   GFR, Estimated >60 >60 mL/min    Comment: (NOTE) Calculated using the CKD-EPI Creatinine Equation (2021)    Anion gap  5 5 - 15    Comment: Performed at [facility] [address], [place], [place] [address]  Magnesium     Status: None   Collection Time: 11/26/20  2:01 AM  Result Value Ref Range   Magnesium 1.7 1.7 - 2.4 mg/dL    Comment: Performed at [facility], [place] [address], [place], [place] [address]  Glucose, capillary     Status: Abnormal   Collection Time: 11/26/20  6:28 AM  Result Value Ref Range   Glucose-Capillary 120 (H) 70 - 99 mg/dL    Comment: Glucose reference range applies only to samples taken after fasting for at least 8 hours.   MR LIVER W WO CONTRAST  Result Date: 11/25/2020 CLINICAL DATA:  Further evaluation of hepatic lesions seen on ultrasound. EXAM: MRI ABDOMEN WITHOUT AND WITH CONTRAST TECHNIQUE: Multiplanar multisequence MR imaging of the abdomen was performed both before and after the administration of intravenous contrast. CONTRAST:  99mL GADAVIST GADOBUTROL 1 MMOL/ML IV SOLN COMPARISON:  Ultrasound November 25, 2020 and CT November 24, 2020. FINDINGS: Lower chest: Heterogeneous signal in the dependent lungs favored atelectasis. Hepatobiliary: Mild diffuse hepatic steatosis. Slight nodularity of the hepatic contour possibly reflecting changes of cirrhosis. Well-circumscribed T2 hyperintense nonenhancing 3.8 cm hepatic cyst on image 28/3 which corresponds with the lesion seen on prior CT and ultrasound. No suspicious hepatic lesion. Cholelithiasis in a distended gallbladder without pericholecystic fluid. No biliary ductal dilation. The common bile duct measures 4 mm. No choledocholithiasis. Pancreas: Intrinsic T1 signal of the pancreatic parenchyma is within normal limits. No pancreatic ductal dilation. No suspicious pancreatic mass visualized. Spleen:  Within normal limits. Adrenals/Urinary Tract: Bilateral adrenal glands are unremarkable. No hydronephrosis. No solid enhancing renal mass. Stomach/Bowel: No evidence of acute bowel inflammation or obstruction. Colonic  diverticulosis without findings of acute diverticulitis. Vascular/Lymphatic: No abdominal aortic aneurysm. The portal, splenic and superior mesenteric veins are patent. Prominent periportal lymph nodes measuring up to  8 mm. Other:  No abdominal ascites. Musculoskeletal: No suspicious bone lesions identified. IMPRESSION: 1. Mild diffuse hepatic steatosis with slight nodularity of the hepatic contour possibly reflecting changes of cirrhosis. No ascites or splenomegaly. 2. Well-circumscribed nonenhancing 3.8 cm hepatic cyst which corresponds with the lesion seen on prior CT. No suspicious hepatic lesion. 3. Cholelithiasis in a distended gallbladder without pericholecystic fluid. No biliary ductal dilation or choledocholithiasis. Electronically Signed   By: Dahlia Bailiff M.D.   On: 11/25/2020 18:37   DG Chest Port 1 View  Result Date: 11/24/2020 CLINICAL DATA:  Sepsis. EXAM: PORTABLE CHEST 1 VIEW COMPARISON:  Chest CT dated 11/24/2020. FINDINGS: Mild eventration of the right hemidiaphragm. No focal consolidation, pleural effusion, pneumothorax. The cardiac silhouette is within normal limits. No acute osseous pathology. IMPRESSION: No active disease. Electronically Signed   By: Anner Crete M.D.   On: 11/24/2020 23:05   CT Angio Chest/Abd/Pel for Dissection W and/or Wo Contrast  Result Date: 11/24/2020 CLINICAL DATA:  Chest and abdomen pain with aortic dissection suspected. EXAM: CT ANGIOGRAPHY CHEST, ABDOMEN AND PELVIS TECHNIQUE: Non-contrast CT of the chest was initially obtained. Multidetector CT imaging through the chest, abdomen and pelvis was performed using the standard protocol during bolus administration of intravenous contrast. Multiplanar reconstructed images and MIPs were obtained and reviewed to evaluate the vascular anatomy. CONTRAST:  175mL OMNIPAQUE IOHEXOL 350 MG/ML SOLN COMPARISON:  None. FINDINGS: CTA CHEST FINDINGS Cardiovascular: Normal cardiac size. No arterial dilatation or central  embolus. Mild calcification in the distal right coronary artery with trace calcification in the left-sided arteries. 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 The remainder is normal in caliber and the great vessels are clear. There is no pericardial effusion. Mediastinum/Nodes: There are small scattered thyroid nodules, the largest is 1.8 cm in the left lobe. There is no intrathoracic adenopathy, focal esophageal thickening, hiatal hernia or tracheobronchial filling defect. Lungs/Pleura: There is no pleural effusion, thickening or pneumothorax. There is an asymmetrical elevated right hemidiaphragm consistent with eventration or paresis. There are faint ground-glass opacities in the lower lobes which are probably microatelectasis, less likely pneumonitis. The remaining lungs are clear. 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 No suspicious bone lesions. Review of the MIP images confirms the above findings. CTA ABDOMEN AND PELVIS FINDINGS VASCULAR Aorta: Normal caliber aorta without aneurysm, dissection, vasculitis or significant stenosis. Celiac: Patent without evidence of aneurysm, dissection, vasculitis or significant stenosis. SMA: Patent without evidence of aneurysm, dissection, vasculitis or significant stenosis. Renals: Both renal arteries are patent without evidence of aneurysm, dissection, vasculitis, fibromuscular dysplasia or significant stenosis. IMA: Patent without evidence of aneurysm, dissection, vasculitis or significant stenosis. Inflow: Patent without evidence of aneurysm, dissection, vasculitis or significant stenosis. Veins: No obvious venous abnormality within the limitations of this arterial phase study. Review of the MIP images confirms the above findings. NON-VASCULAR Hepatobiliary: Not well seen  due to artifact from the patient's arms in the field. There are small stones in the gallbladder without wall thickening or biliary dilatation. There is a 4.1 cm low-density lesion medially in the right hepatic lobe posterior segment which measures 25 Hounsfield units above the typical density of fluid but appears thin walled and uncomplicated. The liver is mildly steatotic, without other obvious focal lesion. Appears to be a nodular surface contour in the left lobe which could  suggest early cirrhosis. Pancreas: Unremarkable. Spleen: No mass enhancement or splenomegaly. Adrenals/Urinary Tract: There is no adrenal mass. No renal mass, calculus or hydronephrosis are seen. Stomach/Bowel: No dilatation or wall thickening, including the appendix. Constipation. Left colonic diverticula without evidence of acute diverticulitis Lymphatic: No enlarged abdominopelvic nodes. Reproductive: Normal prostate. Other: No free air, hemorrhage or fluid. Musculoskeletal: Advanced degenerative change lumbar spine. Multilevel lumbar foraminal stenosis. Review of the MIP images confirms the above findings. IMPRESSION: 1. No aortic dissection. 2. 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 Normal abdominal aorta and branch arteries. 3. Faint haziness in the lower lobes is most likely microatelectasis, less likely pneumonitis. Elevated right diaphragm. 4. Small amount of calcification in the distal right coronary artery with trace calcification in the left-sided arteries. 5. Cholelithiasis without gallbladder thickening or biliary dilatation. 6. Question early changes of cirrhosis of liver. No splenomegaly or ascites. 7. 4.1 cm low-density lesion in the right hepatic lobe above the usual density of fluid. MRI recommended. 8. Constipation and diverticulosis. 9. 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 10. Thyroid nodules.  Routine ultrasound follow-up recommended. Electronically Signed   By: Telford Nab M.D.   On:  11/24/2020 23:08   US Abdomen Limited RUQ (LIVER/GB)  Result Date: 11/25/2020 CLINICAL DATA:  Right upper quadrant abdominal pain. EXAM: ULTRASOUND ABDOMEN LIMITED RIGHT UPPER QUADRANT COMPARISON:  CT dated 11/24/2020. FINDINGS: Evaluation is very limited due to body habitus and overlying bowel gas. Gallbladder: There is sludge and stone within the gallbladder. There is no gallbladder wall thickening or pericholecystic fluid. Negative sonographic Murphy's sign. Common bile duct: Diameter: 5 mm Liver: The liver demonstrates a heterogeneous echotexture. Ill-defined echogenic area in the right lobe of the liver measuring approximately 10 x 6 x 7 cm is not evaluated and poorly visualized. No corresponding mass noted on the CT. Portal vein is patent on color Doppler imaging with normal direction of blood flow towards the liver. Other: None. IMPRESSION: 1. Cholelithiasis without sonographic evidence of acute cholecystitis. 2. Heterogeneous liver. Electronically Signed   By: Anner Crete M.D.   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

## 2020-11-26 NOTE — Progress Notes (Addendum)
PROGRESS NOTE    Antonio Horton  RDE:[id number] DOB: [birth year] DOA: 11/24/2020 PCP: Pleas Koch, NP    Brief Narrative:  Mr. Errington was admitted to the hospital with the working diagnosis of symptomatic cholelithiasis, complicated with syncope and pneumonitis.    55 year old male past medical history for hypertension, type 2 diabetes mellitus, dyslipidemia and obstructive sleep apnea who presented with syncope episode.  Reported several days of intermittent abdominal pain, epigastric, radiated to bilateral upper quadrants.  Associated with multiple episodes of vomiting, including 3 syncope episodes while vomiting.  After last syncope episode patient experienced dyspnea on exertion.  On his initial physical examination his oximetry was in the 80s on room air, heart rate 79, respiratory rate 23, blood pressure 106/71, on supplemental oxygen oximetry 94%.  His lungs had diminished breath sounds, bibasilar rales and no wheezing, heart S1-S2, present, rhythmic, abdomen diffusely tender, nondistended, mild lower extremity edema.   Sodium 136, potassium 3.4, chloride 105, bicarb 18, glucose 187, BUN 16, creatinine 1.0, lipase 26, AST 17, ALT 19.  Lactic acid 2.1, white count 13.8, hemoglobin 17.1, hematocrit 49.4, platelets 246. SARS COVID-19 negative.   Urine analysis specific gravity > 1.046, negative nitrates.  Negative leukocytes.   Chest radiograph negative for infiltrates. CT chest abdomen pelvis.  A sending aneurysm of the aorta 5.2 cm. Cholelithiasis without gallbladder thickening or biliary dilatation. 4.1 cm low-density lesion in the right hepatic lobe above the typical density of fluid but appears thin walled and uncomplicated.    Cholelithiasis without sonographic evidence of acute cholecystitis. Heterogeneous liver.   EKG 83 bpm, left axis deviation, normal intervals, sinus rhythm, no significant ST segment T wave changes.  Patient with no further nausea and  vomiting. Plan for cholecystectomy tomorrow.    Assessment & Plan:   Principal Problem:   Cholelithiases Active Problems:   Type 2 diabetes mellitus (HCC)   Obstructive sleep apnea   Hypotension   Pneumonitis   Nausea and vomiting   Syncope   Hypoxemia     Symptomatic cholelithiasis. Sepsis ruled out.  It has been complicated with vasovagal syncope and aspiration pneumonitis.  Patient with no nausea or vomiting, his abdominal pain has improved but not back to baseline.  Wbc is down to 10.3 and he has remained afebrile.  Liver MRi with mild diffuse hepatic steatosis and slight nodularity at the hepatic contour possible cirrhosis. No ascites or splenomegaly.  3.8 cm hepatic cyst.  Hypoxemic respiratory failure due to pneumonitis has resolved.   Surgery has been consulted and plan for cholecystectomy tomorrow.  Discontinue antibiotic therapy and telemetry monitoring Out of bed to chair tid with meals and ambulate in the hallway.  Continue with as needed antiemetics and analgesics.    2. T2DM/ dyslipidemia  Fasting glucose is 137, patient with no nausea or vomiting. Continue insulin sliding scale for glucose cover and monitoring.   On statin therapy.    3. HTN/ Ascending aortic aneurysm  Continue blood pressure monitoring, systolic 631 to 497 mmHg. Will need outpatient follow up for aneurysm.    Holding on antihypertensive medications, (at home patient on lisinopril and HCTZ).    4. 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  Calculated BMI is 39,5   5. Incidental thyroid nodule. Follow up as outpatient.   Patient continue to be at high risk for worsening cholelithiasis   Status is: Inpatient  Remains inpatient appropriate because:  surgical intervention    DVT prophylaxis: Enoxaparin   Code Status:   full  Family  Communication: I spoke with patient's wife and daughter at the bedside, we talked in detail about patient's condition, plan of care and prognosis and all questions  were addressed.      Consultants:  Surgery     Subjective: Patient with no nausea or vomiting, abdominal pain has improved but not back to baseline, no chest pain or dyspnea.,   Objective: Vitals:   11/26/20 0500 11/26/20 0554 11/26/20 0557 11/26/20 0600  BP:  126/86  126/86  Pulse: 79 89 87 88  Resp: 19 10 20 19   Temp:      TempSrc:      SpO2: 93% 91% 90% 90%  Weight:      Height:        Intake/Output Summary (Last 24 hours) at 11/26/2020 1209 Last data filed at 11/26/2020 0900 Gross per 24 hour  Intake 279.87 ml  Output 551 ml  Net -271.13 ml   Filed Weights   11/24/20 2137 11/25/20 1928  Weight: 136.1 kg (!) 141.9 kg    Examination:   General: Not in pain or dyspnea Neurology: Awake and alert, non focal  E ENT: mild pallor, no icterus, oral mucosa moist Cardiovascular: No JVD. S1-S2 present, rhythmic, no gallops, rubs, or murmurs. No lower extremity edema. Pulmonary: positive breath sounds bilaterally, adequate air movement, no wheezing, rhonchi or rales. Gastrointestinal. Abdomen mild tender to deep palpation at the right upper quadrant,  Skin. No rashes Musculoskeletal: no joint deformities     Data Reviewed: I have personally reviewed following labs and imaging studies  CBC: Recent Labs  Lab 11/24/20 2154 11/24/20 2157 11/24/20 2158 11/25/20 0441 11/26/20 0201  WBC 13.8*  --   --  15.1* 10.3  NEUTROABS 10.6*  --   --   --   --   HGB 17.1* 16.7 16.7 15.1 14.1  HCT 49.4 49.0 49.0 44.4 41.2  MCV 88.8  --   --  90.1 90.0  PLT 246  --   --  217 026   Basic Metabolic Panel: Recent Labs  Lab 11/24/20 2154 11/24/20 2157 11/24/20 2158 11/25/20 0441 11/26/20 0201  NA 136 138 137 135 138  K 3.4* 3.4* 3.6 4.1 3.3*  CL 105 105  --  106 106  CO2 18*  --   --  21* 27  GLUCOSE 187* 193*  --  155* 137*  BUN 16 18  --  14 10  CREATININE 1.02 1.00  --  0.98 1.05  CALCIUM 8.4*  --   --  8.1* 8.4*  MG  --   --   --   --  1.7   GFR: Estimated  Creatinine Clearance: 120.5 mL/min (by C-G formula based on SCr of 1.05 mg/dL). Liver Function Tests: Recent Labs  Lab 11/24/20 2154  AST 17  ALT 19  ALKPHOS 37*  BILITOT 0.9  PROT 6.2*  ALBUMIN 3.2*   Recent Labs  Lab 11/24/20 2154  LIPASE 26   No results for input(s): AMMONIA in the last 168 hours. Coagulation Profile: Recent Labs  Lab 11/24/20 2154  INR 1.2   Cardiac Enzymes: No results for input(s): CKTOTAL, CKMB, CKMBINDEX, TROPONINI in the last 168 hours. BNP (last 3 results) No results for input(s): PROBNP in the last 8760 hours. HbA1C: Recent Labs    11/25/20 0442  HGBA1C 6.6*   CBG: Recent Labs  Lab 11/25/20 1409 11/25/20 1647 11/25/20 2124 11/26/20 0628 11/26/20 1153  GLUCAP 144* 143* 126* 120* 124*   Lipid Profile: No results  for input(s): CHOL, HDL, LDLCALC, TRIG, CHOLHDL, LDLDIRECT in the last 72 hours. Thyroid Function Tests: No results for input(s): TSH, T4TOTAL, FREET4, T3FREE, THYROIDAB in the last 72 hours. Anemia Panel: No results for input(s): VITAMINB12, FOLATE, FERRITIN, TIBC, IRON, RETICCTPCT in the last 72 hours.    Radiology Studies: I have reviewed all of the imaging during this hospital visit personally     Scheduled Meds:  docusate sodium  100 mg Oral BID   enoxaparin (LOVENOX) injection  40 mg Subcutaneous Daily   insulin aspart  0-15 Units Subcutaneous TID WC & HS   pantoprazole  40 mg Oral Daily   polyethylene glycol  17 g Oral Daily   rosuvastatin  5 mg Oral QPM   Continuous Infusions:   LOS: 1 day        Shantay Sonn Gerome Apley, MD

## 2020-11-26 NOTE — Plan of Care (Signed)

## 2020-11-27 ENCOUNTER — Encounter (HOSPITAL_COMMUNITY)
Admission: EM | Disposition: A | Discharge status: Home / Self Care | Payer: Self-pay | Source: Home / Self Care | Attending: Internal Medicine

## 2020-11-27 ENCOUNTER — Inpatient Hospital Stay (HOSPITAL_COMMUNITY): Payer: 59 | Admitting: Anesthesiology

## 2020-11-27 ENCOUNTER — Encounter (HOSPITAL_COMMUNITY): Payer: Self-pay | Admitting: Internal Medicine

## 2020-11-27 DIAGNOSIS — Z9049 Acquired absence of other specified parts of digestive tract: Secondary | ICD-10-CM

## 2020-11-27 DIAGNOSIS — I9589 Other hypotension: Secondary | ICD-10-CM | POA: Diagnosis not present

## 2020-11-27 DIAGNOSIS — R0902 Hypoxemia: Secondary | ICD-10-CM | POA: Diagnosis not present

## 2020-11-27 DIAGNOSIS — G4733 Obstructive sleep apnea (adult) (pediatric): Secondary | ICD-10-CM | POA: Diagnosis not present

## 2020-11-27 DIAGNOSIS — K8020 Calculus of gallbladder without cholecystitis without obstruction: Secondary | ICD-10-CM | POA: Diagnosis not present

## 2020-11-27 HISTORY — PX: CHOLECYSTECTOMY: SHX55

## 2020-11-27 HISTORY — DX: Acquired absence of other specified parts of digestive tract: Z90.49

## 2020-11-27 LAB — COMPREHENSIVE METABOLIC PANEL WITH GFR
ALT: 11 U/L (ref 0–44)
AST: 12 U/L — ABNORMAL LOW (ref 15–41)
Albumin: 2.7 g/dL — ABNORMAL LOW (ref 3.5–5.0)
Alkaline Phosphatase: 38 U/L (ref 38–126)
Anion gap: 7 (ref 5–15)
BUN: 9 mg/dL (ref 6–20)
CO2: 26 mmol/L (ref 22–32)
Calcium: 8.4 mg/dL — ABNORMAL LOW (ref 8.9–10.3)
Chloride: 103 mmol/L (ref 98–111)
Creatinine, Ser: 1 mg/dL (ref 0.61–1.24)
GFR, Estimated: >60 mL/min
Glucose, Bld: 140 mg/dL — ABNORMAL HIGH (ref 70–99)
Potassium: 3.4 mmol/L — ABNORMAL LOW (ref 3.5–5.1)
Sodium: 136 mmol/L (ref 135–145)
Total Bilirubin: 0.8 mg/dL (ref 0.3–1.2)
Total Protein: 5.2 g/dL — ABNORMAL LOW (ref 6.5–8.1)

## 2020-11-27 LAB — CBC
HCT: 39.5 % (ref 39.0–52.0)
Hemoglobin: 13.3 g/dL (ref 13.0–17.0)
MCH: 30.4 pg (ref 26.0–34.0)
MCHC: 33.7 g/dL (ref 30.0–36.0)
MCV: 90.4 fL (ref 80.0–100.0)
Platelets: 169 10*3/uL (ref 150–400)
RBC: 4.37 MIL/uL (ref 4.22–5.81)
RDW: 12.9 % (ref 11.5–15.5)
WBC: 9.4 10*3/uL (ref 4.0–10.5)
nRBC: 0 % (ref 0.0–0.2)

## 2020-11-27 LAB — GLUCOSE, CAPILLARY
Glucose-Capillary: 134 mg/dL — ABNORMAL HIGH (ref 70–99)
Glucose-Capillary: 130 mg/dL — ABNORMAL HIGH (ref 70–99)
Glucose-Capillary: 123 mg/dL — ABNORMAL HIGH (ref 70–99)

## 2020-11-27 SURGERY — LAPAROSCOPIC CHOLECYSTECTOMY
Anesthesia: General

## 2020-11-27 MED ORDER — FENTANYL CITRATE (PF) 250 MCG/5ML IJ SOLN
INTRAMUSCULAR | Status: DC | PRN
  Administered 2020-11-27: 50 ug via INTRAVENOUS
  Administered 2020-11-27 (×2): 100 ug via INTRAVENOUS
  Administered 2020-11-27: 150 ug via INTRAVENOUS
  Administered 2020-11-27: 50 ug via INTRAVENOUS

## 2020-11-27 MED ORDER — DOCUSATE SODIUM 100 MG PO CAPS: 100.0000 mg | ORAL_CAPSULE | Freq: Every day | ORAL | 0 refills | Status: DC | PRN

## 2020-11-27 MED ORDER — DEXAMETHASONE SODIUM PHOSPHATE 10 MG/ML IJ SOLN
INTRAMUSCULAR | Status: AC
  Filled 2020-11-27: qty 1

## 2020-11-27 MED ORDER — MIDAZOLAM HCL 2 MG/2ML IJ SOLN
INTRAMUSCULAR | Status: DC | PRN
  Administered 2020-11-27: 2 mg via INTRAVENOUS

## 2020-11-27 MED ORDER — FENTANYL CITRATE (PF) 100 MCG/2ML IJ SOLN
25.0000 ug | INTRAMUSCULAR | Status: DC | PRN
  Administered 2020-11-27 (×2): 50 ug via INTRAVENOUS

## 2020-11-27 MED ORDER — CHLORHEXIDINE GLUCONATE 0.12 % MT SOLN
OROMUCOSAL | Status: AC
  Administered 2020-11-27: 15 mL via OROMUCOSAL
  Filled 2020-11-27: qty 15

## 2020-11-27 MED ORDER — OXYCODONE HCL 5 MG PO TABS: 5.0000 mg | ORAL_TABLET | Freq: Four times a day (QID) | ORAL | 0 refills | Status: AC | PRN

## 2020-11-27 MED ORDER — AMISULPRIDE (ANTIEMETIC) 5 MG/2ML IV SOLN: 10.0000 mg | Freq: Once | INTRAVENOUS | Status: DC | PRN

## 2020-11-27 MED ORDER — SODIUM CHLORIDE 0.9 % IR SOLN
Status: DC | PRN
  Administered 2020-11-27 (×2): 1000 mL

## 2020-11-27 MED ORDER — LIDOCAINE 2% (20 MG/ML) 5 ML SYRINGE
INTRAMUSCULAR | Status: DC | PRN
  Administered 2020-11-27: 60 mg via INTRAVENOUS

## 2020-11-27 MED ORDER — SUCCINYLCHOLINE CHLORIDE 200 MG/10ML IV SOSY
PREFILLED_SYRINGE | INTRAVENOUS | Status: AC
  Filled 2020-11-27: qty 10

## 2020-11-27 MED ORDER — ONDANSETRON HCL 4 MG/2ML IJ SOLN
INTRAMUSCULAR | Status: DC | PRN
  Administered 2020-11-27: 4 mg via INTRAVENOUS

## 2020-11-27 MED ORDER — CHLORHEXIDINE GLUCONATE 0.12 % MT SOLN: 15.0000 mL | Freq: Once | OROMUCOSAL | Status: AC

## 2020-11-27 MED ORDER — POTASSIUM CHLORIDE 10 MEQ/100ML IV SOLN: 10.0000 meq | INTRAVENOUS | Status: DC

## 2020-11-27 MED ORDER — PANTOPRAZOLE SODIUM 40 MG PO TBEC: 40.0000 mg | DELAYED_RELEASE_TABLET | Freq: Every day | ORAL | 0 refills | Status: DC

## 2020-11-27 MED ORDER — POTASSIUM CHLORIDE CRYS ER 20 MEQ PO TBCR
40.0000 meq | EXTENDED_RELEASE_TABLET | Freq: Once | ORAL | Status: AC
  Administered 2020-11-27: 40 meq via ORAL
  Filled 2020-11-27: qty 2

## 2020-11-27 MED ORDER — DEXAMETHASONE SODIUM PHOSPHATE 10 MG/ML IJ SOLN
INTRAMUSCULAR | Status: DC | PRN
  Administered 2020-11-27: 5 mg via INTRAVENOUS

## 2020-11-27 MED ORDER — POTASSIUM CHLORIDE 10 MEQ/100ML IV SOLN
10.0000 meq | INTRAVENOUS | Status: DC
  Filled 2020-11-27: qty 100

## 2020-11-27 MED ORDER — ROCURONIUM BROMIDE 10 MG/ML (PF) SYRINGE
PREFILLED_SYRINGE | INTRAVENOUS | Status: DC | PRN
  Administered 2020-11-27: 30 mg via INTRAVENOUS
  Administered 2020-11-27: 50 mg via INTRAVENOUS

## 2020-11-27 MED ORDER — LACTATED RINGERS IV SOLN: INTRAVENOUS | Status: DC

## 2020-11-27 MED ORDER — ACETAMINOPHEN 325 MG PO TABS: 650.0000 mg | ORAL_TABLET | Freq: Four times a day (QID) | ORAL | Status: DC | PRN

## 2020-11-27 MED ORDER — ACETAMINOPHEN 10 MG/ML IV SOLN
INTRAVENOUS | Status: AC
  Filled 2020-11-27: qty 100

## 2020-11-27 MED ORDER — SUGAMMADEX SODIUM 200 MG/2ML IV SOLN
INTRAVENOUS | Status: DC | PRN
  Administered 2020-11-27 (×3): 100 mg via INTRAVENOUS

## 2020-11-27 MED ORDER — ROCURONIUM BROMIDE 10 MG/ML (PF) SYRINGE
PREFILLED_SYRINGE | INTRAVENOUS | Status: AC
  Filled 2020-11-27: qty 10

## 2020-11-27 MED ORDER — OXYCODONE HCL 5 MG PO TABS
5.0000 mg | ORAL_TABLET | ORAL | Status: DC | PRN
  Administered 2020-11-27: 10 mg via ORAL
  Filled 2020-11-27: qty 2

## 2020-11-27 MED ORDER — FENTANYL CITRATE (PF) 100 MCG/2ML IJ SOLN
INTRAMUSCULAR | Status: AC
  Filled 2020-11-27: qty 2

## 2020-11-27 MED ORDER — BUPIVACAINE-EPINEPHRINE (PF) 0.25% -1:200000 IJ SOLN
INTRAMUSCULAR | Status: AC
  Filled 2020-11-27: qty 30

## 2020-11-27 MED ORDER — SUCCINYLCHOLINE CHLORIDE 200 MG/10ML IV SOSY
PREFILLED_SYRINGE | INTRAVENOUS | Status: DC | PRN
  Administered 2020-11-27: 160 mg via INTRAVENOUS

## 2020-11-27 MED ORDER — ORAL CARE MOUTH RINSE: 15.0000 mL | Freq: Once | OROMUCOSAL | Status: AC

## 2020-11-27 MED ORDER — ONDANSETRON HCL 4 MG/2ML IJ SOLN
INTRAMUSCULAR | Status: AC
  Filled 2020-11-27: qty 2

## 2020-11-27 MED ORDER — HEMOSTATIC AGENTS (NO CHARGE) OPTIME
TOPICAL | Status: DC | PRN
  Administered 2020-11-27: 1 via TOPICAL

## 2020-11-27 MED ORDER — LIDOCAINE 2% (20 MG/ML) 5 ML SYRINGE
INTRAMUSCULAR | Status: AC
  Filled 2020-11-27: qty 5

## 2020-11-27 MED ORDER — 0.9 % SODIUM CHLORIDE (POUR BTL) OPTIME
TOPICAL | Status: DC | PRN
  Administered 2020-11-27: 1000 mL

## 2020-11-27 MED ORDER — FENTANYL CITRATE (PF) 250 MCG/5ML IJ SOLN
INTRAMUSCULAR | Status: AC
  Filled 2020-11-27: qty 5

## 2020-11-27 MED ORDER — ACETAMINOPHEN 10 MG/ML IV SOLN
INTRAVENOUS | Status: DC | PRN
  Administered 2020-11-27: 1000 mg via INTRAVENOUS

## 2020-11-27 MED ORDER — IBUPROFEN 800 MG PO TABS: 800.0000 mg | ORAL_TABLET | Freq: Three times a day (TID) | ORAL | Status: DC | PRN

## 2020-11-27 MED ORDER — PROPOFOL 10 MG/ML IV BOLUS
INTRAVENOUS | Status: AC
  Filled 2020-11-27: qty 40

## 2020-11-27 MED ORDER — MIDAZOLAM HCL 2 MG/2ML IJ SOLN
INTRAMUSCULAR | Status: AC
  Filled 2020-11-27: qty 2

## 2020-11-27 MED ORDER — CHLORHEXIDINE GLUCONATE 0.12 % MT SOLN
OROMUCOSAL | Status: AC
  Filled 2020-11-27: qty 15

## 2020-11-27 MED ORDER — PROPOFOL 10 MG/ML IV BOLUS
INTRAVENOUS | Status: DC | PRN
  Administered 2020-11-27: 180 mg via INTRAVENOUS

## 2020-11-27 MED ORDER — BUPIVACAINE-EPINEPHRINE 0.25% -1:200000 IJ SOLN
INTRAMUSCULAR | Status: DC | PRN
  Administered 2020-11-27: 21 mL

## 2020-11-27 SURGICAL SUPPLY — 47 items
ADH SKN CLS APL DERMABOND .7 (GAUZE/BANDAGES/DRESSINGS) ×1
APL PRP STRL LF DISP 70% ISPRP (MISCELLANEOUS) ×1
APPLIER CLIP 5 13 M/L LIGAMAX5 (MISCELLANEOUS) ×2
APR CLP MED LRG 5 ANG JAW (MISCELLANEOUS) ×1
BAG COUNTER SPONGE SURGICOUNT (BAG) ×2 IMPLANT
BAG SPEC RTRVL 10 TROC 200 (ENDOMECHANICALS) ×1
BAG SPNG CNTER NS LX DISP (BAG) ×1
BLADE CLIPPER SURG (BLADE) ×1 IMPLANT
CANISTER SUCT 3000ML PPV (MISCELLANEOUS) ×2 IMPLANT
CHLORAPREP W/TINT 26 (MISCELLANEOUS) ×2 IMPLANT
CLIP APPLIE 5 13 M/L LIGAMAX5 (MISCELLANEOUS) ×1 IMPLANT
COVER SURGICAL LIGHT HANDLE (MISCELLANEOUS) ×2 IMPLANT
DERMABOND ADVANCED (GAUZE/BANDAGES/DRESSINGS) ×1
DERMABOND ADVANCED .7 DNX12 (GAUZE/BANDAGES/DRESSINGS) ×1 IMPLANT
ELECT REM PT RETURN 9FT ADLT (ELECTROSURGICAL) ×2
ELECTRODE REM PT RTRN 9FT ADLT (ELECTROSURGICAL) ×1 IMPLANT
GAUZE 4X4 16PLY ~~LOC~~+RFID DBL (SPONGE) ×1 IMPLANT
GLOVE SRG 8 PF TXTR STRL LF DI (GLOVE) ×1 IMPLANT
GLOVE SURG ENC MOIS LTX SZ8 (GLOVE) ×2 IMPLANT
GLOVE SURG UNDER POLY LF SZ8 (GLOVE) ×2
GOWN STRL REUS W/ TWL LRG LVL3 (GOWN DISPOSABLE) ×2 IMPLANT
GOWN STRL REUS W/ TWL XL LVL3 (GOWN DISPOSABLE) ×1 IMPLANT
GOWN STRL REUS W/TWL LRG LVL3 (GOWN DISPOSABLE) ×4
GOWN STRL REUS W/TWL XL LVL3 (GOWN DISPOSABLE) ×2
KIT BASIN OR (CUSTOM PROCEDURE TRAY) ×2 IMPLANT
KIT TURNOVER KIT B (KITS) ×2 IMPLANT
L-HOOK LAP DISP 36CM (ELECTROSURGICAL) ×2
LHOOK LAP DISP 36CM (ELECTROSURGICAL) ×1 IMPLANT
NEEDLE 22X1 1/2 (OR ONLY) (NEEDLE) ×2 IMPLANT
NS IRRIG 1000ML POUR BTL (IV SOLUTION) ×2 IMPLANT
PAD ARMBOARD 7.5X6 YLW CONV (MISCELLANEOUS) ×2 IMPLANT
PENCIL BUTTON HOLSTER BLD 10FT (ELECTRODE) ×2 IMPLANT
POUCH RETRIEVAL ECOSAC 10 (ENDOMECHANICALS) ×1 IMPLANT
POUCH RETRIEVAL ECOSAC 10MM (ENDOMECHANICALS) ×2
SCISSORS LAP 5X35 DISP (ENDOMECHANICALS) ×2 IMPLANT
SET IRRIG TUBING LAPAROSCOPIC (IRRIGATION / IRRIGATOR) ×2 IMPLANT
SET TUBE SMOKE EVAC HIGH FLOW (TUBING) ×2 IMPLANT
SLEEVE ENDOPATH XCEL 5M (ENDOMECHANICALS) ×4 IMPLANT
SPECIMEN JAR SMALL (MISCELLANEOUS) ×2 IMPLANT
SPONGE T-LAP 18X18 ~~LOC~~+RFID (SPONGE) ×1 IMPLANT
SUT VIC AB 4-0 PS2 27 (SUTURE) ×2 IMPLANT
TOWEL GREEN STERILE (TOWEL DISPOSABLE) ×2 IMPLANT
TOWEL GREEN STERILE FF (TOWEL DISPOSABLE) ×2 IMPLANT
TRAY LAPAROSCOPIC MC (CUSTOM PROCEDURE TRAY) ×2 IMPLANT
TROCAR XCEL BLUNT TIP 100MML (ENDOMECHANICALS) ×2 IMPLANT
TROCAR XCEL NON-BLD 5MMX100MML (ENDOMECHANICALS) ×2 IMPLANT
WATER STERILE IRR 1000ML POUR (IV SOLUTION) ×2 IMPLANT

## 2020-11-27 NOTE — Anesthesia Procedure Notes (Signed)
Procedure Name: Intubation Date/Time: 11/27/2020 10:50 AM Performed by: Lorie Phenix, CRNA Pre-anesthesia Checklist: Patient identified, Emergency Drugs available, Suction available and Patient being monitored Patient Re-evaluated:Patient Re-evaluated prior to induction Oxygen Delivery Method: Circle system utilized Preoxygenation: Pre-oxygenation with 100% oxygen Induction Type: IV induction Ventilation: Mask ventilation without difficulty Laryngoscope Size: Mac and 4 Grade View: Grade II Tube type: Oral Number of attempts: 1 Airway Equipment and Method: Stylet and Oral airway Placement Confirmation: ETT inserted through vocal cords under direct vision, positive ETCO2 and breath sounds checked- equal and bilateral Secured at: 22 cm Tube secured with: Tape Dental Injury: Teeth and Oropharynx as per pre-operative assessment

## 2020-11-27 NOTE — Progress Notes (Signed)
Mobility Specialist Progress Note    11/27/20 1626  Mobility  Activity Ambulated in hall  Level of Assistance Independent  Assistive Device None  Distance Ambulated (ft) 350 ft  Mobility Ambulated independently in hallway  Mobility Response Tolerated well  Mobility performed by Mobility specialist  $Mobility charge 1 Mobility   Pt received sitting EOB and agreeable. Pt stated he felt a little woozy and weak but that it was going away. Returned to room with family present.   Hildred Alamin Mobility Specialist  Mobility Specialist Phone: [phone]

## 2020-11-27 NOTE — Op Note (Signed)
  11/27/2020  11:49 AM  PATIENT:  Antonio Horton  55 y.o. male  PRE-OPERATIVE DIAGNOSIS:  BILIARY COLIC  POST-OPERATIVE DIAGNOSIS:  chronic cholecystitis  PROCEDURE:  Procedure(s): LAPAROSCOPIC CHOLECYSTECTOMY  SURGEON:  Surgeon(s): Georganna Skeans, MD  ASSISTANTS: Richard Miu, PA-C   ANESTHESIA:   local and general  EBL:  Total I/O In: 1200 [I.V.:1000; IV Piggyback:200] Out: -   BLOOD ADMINISTERED:none  DRAINS: none   SPECIMEN:  Excision  DISPOSITION OF SPECIMEN:  PATHOLOGY  COUNTS:  YES  DICTATION: .Dragon Dictation Procedure in detail: Informed consent was obtained.  He received intravenous antibiotics.  He was brought to the operating room and general endotracheal anesthesia was administered by the anesthesia staff.  His abdomen was prepped and draped in sterile fashion.  We did a timeout procedure.The supraumbilical region was infiltrated with local. supraumbilical incision was made. Subcutaneous tissues were dissected down revealing the anterior fascia. This was divided sharply along the midline. Peritoneal cavity was entered under direct vision without complication. A 0 Vicryl pursestring was placed around the fascial opening. Hassan trocar was inserted into the abdomen. The abdomen was insufflated with carbon dioxide in standard fashion. Under direct vision a 5 mm epigastric and 5 mm right abdominal port x2 were placed.  Local was used at each port site.  Laparoscopic exploration revealed colonic eventration as seen on his CT scan.  Additionally, there was evidence of chronic cholecystitis.  His colon was gently reduced from over the liver.  There was some small adhesions which were taken down using cautery well away from the colonic wall.  This exposed the gallbladder.  The dome was retracted superior medially.  The infundibulum was retracted inferior laterally.  Dissection began laterally and progressed medially easily identifying the cystic duct first.  We  obtained a critical view of safety and then clipped the cystic duct 3 times proximally and once distally.  It was divided.  There was an anterior posterior branch of the cystic artery which were located.  These were both clipped proximally and divided distally with cautery.  The gallbladder was taken off the liver bed using cautery and achieving excellent hemostasis.  The gallbladder was placed in a bag and removed from the abdomen.  It was sent to pathology.  Liver bed was copiously irrigated.  Hemostasis was obtained with cautery.  All the clips remain in good position.  The liver bed was a little bit of oozing so we placed a piece of Surgicel snow.  There was excellent hemostasis then.  The remainder the irrigation was evacuated.  Ports were removed under direct vision.  Pneumoperitoneum was released.  The 4 wounds were irrigated.  The supraumbilical fascia was closed by tying the pursestring.  The skin of each wound was closed with 4-0 Vicryl followed by Dermabond.  All counts were correct.  He tolerated the procedure well without apparent complication was taken recovery in stable condition.  PATIENT DISPOSITION:  PACU - hemodynamically stable.   Delay start of Pharmacological VTE agent (>24hrs) due to surgical blood loss or risk of bleeding:  no  Georganna Skeans, MD, MPH, FACS Pager: [phone]  11/8/202211:49 AM

## 2020-11-27 NOTE — Discharge Summary (Signed)
Physician Discharge Summary  Kuzey Ogata YIR:[id number] DOB: 1965/11/26 DOA: 11/24/2020  PCP: Pleas Koch, NP  Admit date: 11/24/2020 Discharge date: 11/27/2020  Admitted From: Home Disposition:  Home   Recommendations for Outpatient Follow-up and new medication changes:  Follow up with Alma Friendly NP Follow up with surgery as scheduled   Home Health: na   Equipment/Devices: na    Discharge Condition: stable  CODE STATUS: full  Diet recommendation:  heart healthy and diabetic prudent.   Brief/Interim Summary: Mr. Stucky was admitted to the hospital with the working diagnosis of symptomatic cholelithiasis, complicated with syncope and pneumonitis.    55 year old male past medical history for hypertension, type 2 diabetes mellitus, dyslipidemia and obstructive sleep apnea who presented with syncope episode.  Reported several days of intermittent abdominal pain, epigastric, radiated to bilateral upper quadrants.  Associated with multiple episodes of vomiting, including 3 syncope episodes while vomiting.  After last syncope episode patient experienced dyspnea on exertion.  On his initial physical examination his oximetry was in the 80s on room air, heart rate 79, respiratory rate 23, blood pressure 106/71, on supplemental oxygen oximetry 94%.  His lungs had diminished breath sounds, bibasilar rales and no wheezing, heart S1-S2, present, rhythmic, abdomen diffusely tender, nondistended, mild lower extremity edema.   Sodium 136, potassium 3.4, chloride 105, bicarb 18, glucose 187, BUN 16, creatinine 1.0, lipase 26, AST 17, ALT 19.  Lactic acid 2.1, white count 13.8, hemoglobin 17.1, hematocrit 49.4, platelets 246. SARS COVID-19 negative.   Urine analysis specific gravity > 1.046, negative nitrates.  Negative leukocytes.   Chest radiograph negative for infiltrates. CT chest abdomen pelvis.  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 Cholelithiasis without gallbladder  thickening or biliary dilatation. 4.1 cm low-density lesion in the right hepatic lobe above the typical density of fluid but appears thin walled and uncomplicated.    Cholelithiasis without sonographic evidence of acute cholecystitis. Heterogeneous liver.   EKG 83 bpm, left axis deviation, normal intervals, sinus rhythm, no significant ST segment T wave changes.   Patient with no further nausea and vomiting. He received supportive medical therapy including IV fluids, as needed analgesics and antiemetics.  Surgery was consulted and patient underwent laparoscopic cholecystectomy with no major complications.      Symptomatic cholelithiasis. Sepsis ruled out.  It has been complicated with vasovagal syncope and aspiration pneumonitis.   Liver MRi with mild diffuse hepatic steatosis and slight nodularity at the hepatic contour possible cirrhosis. No ascites or splenomegaly.  3.8 cm hepatic cyst.  Hypoxemic respiratory failure due to pneumonitis has resolved.   He received initially broad spectrum antibiotic therapy, but posteriorly were discontinued. No clinical signs of systemic bacterial infection.    Patient's symptoms continue to improve, no further nausea or vomiting,  Cholecystectomy today with no major complications, he will follow up as outpatient.    2. T2DM/ dyslipidemia  Fasting glucose is 140, well controlled for inpatient criteria.  On insulin sliding scale for glucose cover and monitoring.  Continue with statin therapy.   At discharge will resume metformin    3. HTN/ Ascending aortic aneurysm  Well controlled blood pressure, this am 121//76,  During his hospitalization antihypertensive medications were on hold At discharge will resume lisinopril and HCTZ).    4. 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  Calculated BMI is 39,5    5. Incidental thyroid nodule. Follow up as outpatient.    6. Hypokalemia.  Electrolytes were corrected before his discharge     Discharge Diagnoses:  Principal Problem:   Cholelithiases Active Problems:   Type 2 diabetes mellitus (HCC)   Obstructive sleep apnea   Hypotension   Pneumonitis   Nausea and vomiting   Syncope   Hypoxemia   S/P laparoscopic cholecystectomy    Discharge Instructions  Discharge Instructions     Diet - low sodium heart healthy   Complete by: As directed    Discharge instructions   Complete by: As directed    Please follow up with primary care and surgery as scheduled   Increase activity slowly   Complete by: As directed    No wound care   Complete by: As directed       Allergies as of 11/27/2020   No Known Allergies      Medication List     STOP taking these medications    FreeStyle Libre 14 Day Reader [facility] 14 Day Sensor Misc       TAKE these medications    acetaminophen 325 MG tablet Commonly known as: TYLENOL Take 2 tablets (650 mg total) by mouth every 6 (six) hours as needed for mild pain.   docusate sodium 100 MG capsule Commonly known as: COLACE Take 1 capsule (100 mg total) by mouth daily as needed for mild constipation or moderate constipation.   ibuprofen 800 MG tablet Commonly known as: ADVIL Take 1 tablet (800 mg total) by mouth every 8 (eight) hours as needed for mild pain or moderate pain.   lisinopril-hydrochlorothiazide 10-12.5 MG tablet Commonly known as: ZESTORETIC TAKE 1 TABLET BY MOUTH DAILY. FOR BLOOD PRESSURE. OFFICE VISIT REQUIRED FOR FURTHER REFILLS.   metFORMIN 500 MG 24 hr tablet Commonly known as: GLUCOPHAGE-XR Take 1 tablet (500 mg total) by mouth daily with breakfast. For diabetes.   oxyCODONE 5 MG immediate release tablet Commonly known as: Oxy IR/ROXICODONE Take 1 tablet (5 mg total) by mouth every 6 (six) hours as needed for up to 5 days for moderate pain or severe pain.   pantoprazole 40 MG tablet Commonly known as: PROTONIX Take 1 tablet (40 mg total) by mouth daily for 7 days. Start taking on: November 28, 2020    prednisoLONE acetate 1 % ophthalmic suspension Commonly known as: PRED FORTE Place 1 drop into the left eye daily.   rosuvastatin 5 MG tablet Commonly known as: CRESTOR Take 1 tablet (5 mg total) by mouth every evening. For cholesterol. Office visit required for further refills.        Follow-up Information     Pleas Koch, NP .   Specialty: Internal Medicine Contact information: [place] [address] [phone]         Surgery, Olivia Lopez de Gutierrez. Go on 12/20/2020.   Specialty: General Surgery Why: follow up on 12/20/2020 at 1:45 pm, please arrive 30 minutes early to complete check in process and bring photo ID and insurance card if you have them Contact information: [place] [address] [phone]                No Known Allergies  Consultations: Surgery    Procedures/Studies: MR LIVER W WO CONTRAST  Result Date: 11/25/2020 CLINICAL DATA:  Further evaluation of hepatic lesions seen on ultrasound. EXAM: MRI ABDOMEN WITHOUT AND WITH CONTRAST TECHNIQUE: Multiplanar multisequence MR imaging of the abdomen was performed both before and after the administration of intravenous contrast. CONTRAST:  42mL GADAVIST GADOBUTROL 1 MMOL/ML IV SOLN COMPARISON:  Ultrasound November 25, 2020  and CT November 24, 2020. FINDINGS: Lower chest: Heterogeneous signal in the dependent lungs favored atelectasis. Hepatobiliary: Mild diffuse hepatic steatosis. Slight nodularity of the hepatic contour possibly reflecting changes of cirrhosis. Well-circumscribed T2 hyperintense nonenhancing 3.8 cm hepatic cyst on image 28/3 which corresponds with the lesion seen on prior CT and ultrasound. No suspicious hepatic lesion. Cholelithiasis in a distended gallbladder without pericholecystic fluid. No biliary ductal dilation. The common bile duct measures 4 mm. No choledocholithiasis. Pancreas: Intrinsic T1 signal of the pancreatic parenchyma is within  normal limits. No pancreatic ductal dilation. No suspicious pancreatic mass visualized. Spleen:  Within normal limits. Adrenals/Urinary Tract: Bilateral adrenal glands are unremarkable. No hydronephrosis. No solid enhancing renal mass. Stomach/Bowel: No evidence of acute bowel inflammation or obstruction. Colonic diverticulosis without findings of acute diverticulitis. Vascular/Lymphatic: No abdominal aortic aneurysm. The portal, splenic and superior mesenteric veins are patent. Prominent periportal lymph nodes measuring up to 8 mm. Other:  No abdominal ascites. Musculoskeletal: No suspicious bone lesions identified. IMPRESSION: 1. Mild diffuse hepatic steatosis with slight nodularity of the hepatic contour possibly reflecting changes of cirrhosis. No ascites or splenomegaly. 2. Well-circumscribed nonenhancing 3.8 cm hepatic cyst which corresponds with the lesion seen on prior CT. No suspicious hepatic lesion. 3. Cholelithiasis in a distended gallbladder without pericholecystic fluid. No biliary ductal dilation or choledocholithiasis. Electronically Signed   By: Dahlia Bailiff M.D.   On: 11/25/2020 18:37   DG Chest Port 1 View  Result Date: 11/24/2020 CLINICAL DATA:  Sepsis. EXAM: PORTABLE CHEST 1 VIEW COMPARISON:  Chest CT dated 11/24/2020. FINDINGS: Mild eventration of the right hemidiaphragm. No focal consolidation, pleural effusion, pneumothorax. The cardiac silhouette is within normal limits. No acute osseous pathology. IMPRESSION: No active disease. Electronically Signed   By: Anner Crete M.D.   On: 11/24/2020 23:05   CT Angio Chest/Abd/Pel for Dissection W and/or Wo Contrast  Result Date: 11/24/2020 CLINICAL DATA:  Chest and abdomen pain with aortic dissection suspected. EXAM: CT ANGIOGRAPHY CHEST, ABDOMEN AND PELVIS TECHNIQUE: Non-contrast CT of the chest was initially obtained. Multidetector CT imaging through the chest, abdomen and pelvis was performed using the standard protocol during bolus  administration of intravenous contrast. Multiplanar reconstructed images and MIPs were obtained and reviewed to evaluate the vascular anatomy. CONTRAST:  173mL OMNIPAQUE IOHEXOL 350 MG/ML SOLN COMPARISON:  None. FINDINGS: CTA CHEST FINDINGS Cardiovascular: Normal cardiac size. No arterial dilatation or central embolus. Mild calcification in the distal right coronary artery with trace calcification in the left-sided arteries. 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 The remainder is normal in caliber and the great vessels are clear. There is no pericardial effusion. Mediastinum/Nodes: There are small scattered thyroid nodules, the largest is 1.8 cm in the left lobe. There is no intrathoracic adenopathy, focal esophageal thickening, hiatal hernia or tracheobronchial filling defect. Lungs/Pleura: There is no pleural effusion, thickening or pneumothorax. There is an asymmetrical elevated right hemidiaphragm consistent with eventration or paresis. There are faint ground-glass opacities in the lower lobes which are probably microatelectasis, less likely pneumonitis. The remaining lungs are clear. 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 No suspicious bone lesions. Review of the MIP images confirms the above findings. CTA ABDOMEN AND PELVIS FINDINGS VASCULAR Aorta: Normal caliber aorta without aneurysm, dissection, vasculitis or significant stenosis. Celiac: Patent without evidence of aneurysm, dissection, vasculitis or significant  stenosis. SMA: Patent without evidence of aneurysm, dissection, vasculitis or significant stenosis. Renals: Both renal arteries are patent without evidence of aneurysm, dissection, vasculitis, fibromuscular dysplasia or significant stenosis. IMA: Patent without evidence of aneurysm, dissection,  vasculitis or significant stenosis. Inflow: Patent without evidence of aneurysm, dissection, vasculitis or significant stenosis. Veins: No obvious venous abnormality within the limitations of this arterial phase study. Review of the MIP images confirms the above findings. NON-VASCULAR Hepatobiliary: Not well seen due to artifact from the patient's arms in the field. There are small stones in the gallbladder without wall thickening or biliary dilatation. There is a 4.1 cm low-density lesion medially in the right hepatic lobe posterior segment which measures 25 Hounsfield units above the typical density of fluid but appears thin walled and uncomplicated. The liver is mildly steatotic, without other obvious focal lesion. Appears to be a nodular surface contour in the left lobe which could suggest early cirrhosis. Pancreas: Unremarkable. Spleen: No mass enhancement or splenomegaly. Adrenals/Urinary Tract: There is no adrenal mass. No renal mass, calculus or hydronephrosis are seen. Stomach/Bowel: No dilatation or wall thickening, including the appendix. Constipation. Left colonic diverticula without evidence of acute diverticulitis Lymphatic: No enlarged abdominopelvic nodes. Reproductive: Normal prostate. Other: No free air, hemorrhage or fluid. Musculoskeletal: Advanced degenerative change lumbar spine. Multilevel lumbar foraminal stenosis. Review of the MIP images confirms the above findings. IMPRESSION: 1. No aortic dissection. 2. 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 Normal abdominal aorta and branch arteries. 3. Faint haziness in the lower lobes is most likely microatelectasis, less likely pneumonitis. Elevated right diaphragm. 4. Small amount of calcification in the distal right coronary artery with trace calcification in the left-sided arteries. 5. Cholelithiasis without gallbladder thickening or biliary dilatation. 6. Question early changes of cirrhosis of liver. No splenomegaly or  ascites. 7. 4.1 cm low-density lesion in the right hepatic lobe above the usual density of fluid. MRI recommended. 8. Constipation and diverticulosis. 9. 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 10. Thyroid nodules.  Routine ultrasound follow-up recommended. Electronically Signed   By: Telford Nab M.D.   On: 11/24/2020 23:08   US Abdomen Limited RUQ (LIVER/GB)  Result Date: 11/25/2020 CLINICAL DATA:  Right upper quadrant abdominal pain. EXAM: ULTRASOUND ABDOMEN LIMITED RIGHT UPPER QUADRANT COMPARISON:  CT dated 11/24/2020. FINDINGS: Evaluation is very limited due to body habitus and overlying bowel gas. Gallbladder: There is sludge and stone within the gallbladder. There is no gallbladder wall thickening or pericholecystic fluid. Negative sonographic Murphy's sign. Common bile duct: Diameter: 5 mm Liver: The liver demonstrates a heterogeneous echotexture. Ill-defined echogenic area in the right lobe of the liver measuring approximately 10 x 6 x 7 cm is not evaluated and poorly visualized. No corresponding mass noted on the CT. Portal vein is patent on color Doppler imaging with normal direction of blood flow towards the liver. Other: None. IMPRESSION: 1. Cholelithiasis without sonographic evidence of acute cholecystitis. 2. Heterogeneous liver. Electronically Signed   By: Anner Crete M.D.   On: 11/25/2020 00:17     Procedures: cholecystectomy   Subjective: Patient is feeling well, no nausea or vomiting, abdominal pain continue to improve.   Discharge Exam: Vitals:   11/27/20 1230 11/27/20 1244  BP:  109/63  Pulse: 76   Resp: 16   Temp:    SpO2: 94%    Vitals:   11/27/20 1200 11/27/20 1215 11/27/20 1230 11/27/20 1244  BP: 136/65 (!) 118/50  109/63  Pulse: 83 86 76  Resp: 15 (!) 21 16   Temp: (!) 97.3 F (36.3 C)     TempSrc:      SpO2: 100% 100% 94%   Weight:      Height:        General: Not in pain or dyspnea  Neurology: Awake and alert, non focal  E ENT: no pallor, no  icterus, oral mucosa moist Cardiovascular: No JVD. S1-S2 present, rhythmic, no gallops, rubs, or murmurs. No lower extremity edema. Pulmonary: positive breath sounds bilaterally, adequate air movement, no wheezing, rhonchi or rales. Gastrointestinal. Abdomen soft and non tender to superficial palpation Skin. No rashes Musculoskeletal: no joint deformities  The results of significant diagnostics from this hospitalization (including imaging, microbiology, ancillary and laboratory) are listed below for reference.     Microbiology: Recent Results (from the past 240 hour(s))  Urine Culture     Status: None   Collection Time: 11/24/20  9:54 PM   Specimen: In/Out Cath Urine  Result Value Ref Range Status   Specimen Description IN/OUT CATH URINE  Final   Special Requests NONE  Final   Culture   Final    NO GROWTH Performed at [facility], [address], [place], [place] [address]    Report Status 11/26/2020 FINAL  Final  Blood Culture (routine x 2)     Status: None (Preliminary result)   Collection Time: 11/24/20 10:12 PM   Specimen: BLOOD RIGHT FOREARM  Result Value Ref Range Status   Specimen Description BLOOD RIGHT FOREARM  Final   Special Requests   Final    BOTTLES DRAWN AEROBIC AND ANAEROBIC Blood Culture results may not be optimal due to an inadequate volume of blood received in culture bottles   Culture   Final    NO GROWTH 3 DAYS Performed at [facility], [place] [address] [place], [place] [address]    Report Status PENDING  Incomplete  Blood Culture (routine x 2)     Status: None (Preliminary result)   Collection Time: 11/24/20 10:43 PM   Specimen: BLOOD  Result Value Ref Range Status   Specimen Description BLOOD RIGHT ANTECUBITAL  Final   Special Requests   Final    BOTTLES DRAWN AEROBIC AND ANAEROBIC Blood Culture adequate volume   Culture   Final    NO GROWTH 3 DAYS Performed at [facility], [place] [address], [place], [place] [address]    Report  Status PENDING  Incomplete  Resp Panel by RT-PCR (Flu A&B, Covid) Nasopharyngeal Swab     Status: None   Collection Time: 11/24/20 10:52 PM   Specimen: Nasopharyngeal Swab; Nasopharyngeal(NP) swabs in vial transport medium  Result Value Ref Range Status   SARS Coronavirus 2 by RT PCR NEGATIVE NEGATIVE Final    Comment: (NOTE) SARS-CoV-2 target nucleic acids are NOT DETECTED.  The SARS-CoV-2 RNA is generally detectable in upper respiratory specimens during the acute phase of infection. The lowest concentration of SARS-CoV-2 viral copies this assay can detect is 138 copies/mL. A negative result does not preclude SARS-Cov-2 infection and should not be used as the sole basis for treatment or other patient management decisions. A negative result may occur with  improper specimen collection/handling, submission of specimen other than nasopharyngeal swab, presence of viral mutation(s) within the areas targeted by this assay, and inadequate number of viral copies(<138 copies/mL). A negative result must be combined with clinical observations, patient history, and epidemiological information. The expected result is Negative.  Fact Sheet for  Patients:  EntrepreneurPulse.com.au  Fact Sheet for Healthcare Providers:  IncredibleEmployment.be  This test is no t yet approved or cleared by the [place] FDA and  has been authorized for detection and/or diagnosis of SARS-CoV-2 by FDA under an Emergency Use Authorization (EUA). This EUA will remain  in effect (meaning this test can be used) for the duration of the COVID-19 declaration under Section 564(b)(1) of the Act, 21 U.S.C.section 360bbb-3(b)(1), unless the authorization is terminated  or revoked sooner.       Influenza A by PCR NEGATIVE NEGATIVE Final   Influenza B by PCR NEGATIVE NEGATIVE Final    Comment: (NOTE) The Xpert Xpress SARS-CoV-2/FLU/RSV plus assay is intended as an aid in the diagnosis  of influenza from Nasopharyngeal swab specimens and should not be used as a sole basis for treatment. Nasal washings and aspirates are unacceptable for Xpert Xpress SARS-CoV-2/FLU/RSV testing.  Fact Sheet for Patients: EntrepreneurPulse.com.au  Fact Sheet for Healthcare Providers: IncredibleEmployment.be  This test is not yet approved or cleared by the [place] FDA and has been authorized for detection and/or diagnosis of SARS-CoV-2 by FDA under an Emergency Use Authorization (EUA). This EUA will remain in effect (meaning this test can be used) for the duration of the COVID-19 declaration under Section 564(b)(1) of the Act, 21 U.S.C. section 360bbb-3(b)(1), unless the authorization is terminated or revoked.  Performed at [facility], [place] [address] [place], [place] [address]   MRSA Next Gen by PCR, Nasal     Status: None   Collection Time: 11/25/20  9:02 PM   Specimen: Nasal Mucosa; Nasal Swab  Result Value Ref Range Status   MRSA by PCR Next Gen NOT DETECTED NOT DETECTED Final    Comment: (NOTE) The GeneXpert MRSA Assay (FDA approved for NASAL specimens only), is one component of a comprehensive MRSA colonization surveillance program. It is not intended to diagnose MRSA infection nor to guide or monitor treatment for MRSA infections. Test performance is not FDA approved in patients less than 75 years old. Performed at [facility], [place] [address] [place], [place] [address]   Surgical PCR screen     Status: None   Collection Time: 11/26/20  9:48 PM   Specimen: Nasal Mucosa; Nasal Swab  Result Value Ref Range Status   MRSA, PCR NEGATIVE NEGATIVE Final   Staphylococcus aureus NEGATIVE NEGATIVE Final    Comment: (NOTE) The Xpert SA Assay (FDA approved for NASAL specimens in patients 33 years of age and older), is one component of a comprehensive surveillance program. It is not intended to diagnose infection nor to guide  or monitor treatment. Performed at [facility], [place] [address], [place], [place] [address]      Labs: BNP (last 3 results) No results for input(s): BNP in the last 8760 hours. Basic Metabolic Panel: Recent Labs  Lab 11/24/20 2154 11/24/20 2157 11/24/20 2158 11/25/20 0441 11/26/20 0201 11/27/20 0024  NA 136 138 137 135 138 136  K 3.4* 3.4* 3.6 4.1 3.3* 3.4*  CL 105 105  --  106 106 103  CO2 18*  --   --  21* 27 26  GLUCOSE 187* 193*  --  155* 137* 140*  BUN 16 18  --  14 10 9   CREATININE 1.02 1.00  --  0.98 1.05 1.00  CALCIUM 8.4*  --   --  8.1* 8.4* 8.4*  MG  --   --   --   --  1.7  --  Liver Function Tests: Recent Labs  Lab 11/24/20 2154 11/27/20 0024  AST 17 12*  ALT 19 11  ALKPHOS 37* 38  BILITOT 0.9 0.8  PROT 6.2* 5.2*  ALBUMIN 3.2* 2.7*   Recent Labs  Lab 11/24/20 2154  LIPASE 26   No results for input(s): AMMONIA in the last 168 hours. CBC: Recent Labs  Lab 11/24/20 2154 11/24/20 2157 11/24/20 2158 11/25/20 0441 11/26/20 0201 11/27/20 0024  WBC 13.8*  --   --  15.1* 10.3 9.4  NEUTROABS 10.6*  --   --   --   --   --   HGB 17.1* 16.7 16.7 15.1 14.1 13.3  HCT 49.4 49.0 49.0 44.4 41.2 39.5  MCV 88.8  --   --  90.1 90.0 90.4  PLT 246  --   --  217 183 169   Cardiac Enzymes: No results for input(s): CKTOTAL, CKMB, CKMBINDEX, TROPONINI in the last 168 hours. BNP: Invalid input(s): POCBNP CBG: Recent Labs  Lab 11/26/20 1610 11/26/20 2157 11/27/20 0650 11/27/20 0943 11/27/20 1200  GLUCAP 122* 162* 134* 130* 123*   D-Dimer No results for input(s): DDIMER in the last 72 hours. Hgb A1c Recent Labs    11/25/20 0442  HGBA1C 6.6*   Lipid Profile No results for input(s): CHOL, HDL, LDLCALC, TRIG, CHOLHDL, LDLDIRECT in the last 72 hours. Thyroid function studies No results for input(s): TSH, T4TOTAL, T3FREE, THYROIDAB in the last 72 hours.  Invalid input(s): FREET3 Anemia work up No results for input(s): VITAMINB12, FOLATE,  FERRITIN, TIBC, IRON, RETICCTPCT in the last 72 hours. Urinalysis    Component Value Date/Time   COLORURINE YELLOW 11/24/2020 2154   APPEARANCEUR CLEAR 11/24/2020 2154   LABSPEC >1.046 (H) 11/24/2020 2154   PHURINE 5.0 11/24/2020 2154   GLUCOSEU NEGATIVE 11/24/2020 2154   HGBUR NEGATIVE 11/24/2020 2154   HGBUR negative 09/01/2006 0944   BILIRUBINUR NEGATIVE 11/24/2020 2154   KETONESUR 20 (A) 11/24/2020 2154   PROTEINUR NEGATIVE 11/24/2020 2154   UROBILINOGEN negative 09/01/2006 0944   NITRITE NEGATIVE 11/24/2020 2154   LEUKOCYTESUR NEGATIVE 11/24/2020 2154   Sepsis Labs Invalid input(s): PROCALCITONIN,  WBC,  LACTICIDVEN Microbiology Recent Results (from the past 240 hour(s))  Urine Culture     Status: None   Collection Time: 11/24/20  9:54 PM   Specimen: In/Out Cath Urine  Result Value Ref Range Status   Specimen Description IN/OUT CATH URINE  Final   Special Requests NONE  Final   Culture   Final    NO GROWTH Performed at [facility], [place] [address], [place], [place] [address]    Report Status 11/26/2020 FINAL  Final  Blood Culture (routine x 2)     Status: None (Preliminary result)   Collection Time: 11/24/20 10:12 PM   Specimen: BLOOD RIGHT FOREARM  Result Value Ref Range Status   Specimen Description BLOOD RIGHT FOREARM  Final   Special Requests   Final    BOTTLES DRAWN AEROBIC AND ANAEROBIC Blood Culture results may not be optimal due to an inadequate volume of blood received in culture bottles   Culture   Final    NO GROWTH 3 DAYS Performed at [facility], [place] [address], [place], [place] [address]    Report Status PENDING  Incomplete  Blood Culture (routine x 2)     Status: None (Preliminary result)   Collection Time: 11/24/20 10:43 PM   Specimen: BLOOD  Result Value Ref Range Status   Specimen Description BLOOD  RIGHT ANTECUBITAL  Final   Special Requests   Final    BOTTLES DRAWN AEROBIC AND ANAEROBIC Blood Culture adequate volume   Culture    Final    NO GROWTH 3 DAYS Performed at [facility], [address] [place], [place] [address]    Report Status PENDING  Incomplete  Resp Panel by RT-PCR (Flu A&B, Covid) Nasopharyngeal Swab     Status: None   Collection Time: 11/24/20 10:52 PM   Specimen: Nasopharyngeal Swab; Nasopharyngeal(NP) swabs in vial transport medium  Result Value Ref Range Status   SARS Coronavirus 2 by RT PCR NEGATIVE NEGATIVE Final    Comment: (NOTE) SARS-CoV-2 target nucleic acids are NOT DETECTED.  The SARS-CoV-2 RNA is generally detectable in upper respiratory specimens during the acute phase of infection. The lowest concentration of SARS-CoV-2 viral copies this assay can detect is 138 copies/mL. A negative result does not preclude SARS-Cov-2 infection and should not be used as the sole basis for treatment or other patient management decisions. A negative result may occur with  improper specimen collection/handling, submission of specimen other than nasopharyngeal swab, presence of viral mutation(s) within the areas targeted by this assay, and inadequate number of viral copies(<138 copies/mL). A negative result must be combined with clinical observations, patient history, and epidemiological information. The expected result is Negative.  Fact Sheet for Patients:  EntrepreneurPulse.com.au  Fact Sheet for Healthcare Providers:  IncredibleEmployment.be  This test is no t yet approved or cleared by the [place] FDA and  has been authorized for detection and/or diagnosis of SARS-CoV-2 by FDA under an Emergency Use Authorization (EUA). This EUA will remain  in effect (meaning this test can be used) for the duration of the COVID-19 declaration under Section 564(b)(1) of the Act, 21 U.S.C.section 360bbb-3(b)(1), unless the authorization is terminated  or revoked sooner.       Influenza A by PCR NEGATIVE NEGATIVE Final   Influenza B by PCR NEGATIVE  NEGATIVE Final    Comment: (NOTE) The Xpert Xpress SARS-CoV-2/FLU/RSV plus assay is intended as an aid in the diagnosis of influenza from Nasopharyngeal swab specimens and should not be used as a sole basis for treatment. Nasal washings and aspirates are unacceptable for Xpert Xpress SARS-CoV-2/FLU/RSV testing.  Fact Sheet for Patients: EntrepreneurPulse.com.au  Fact Sheet for Healthcare Providers: IncredibleEmployment.be  This test is not yet approved or cleared by the [place] FDA and has been authorized for detection and/or diagnosis of SARS-CoV-2 by FDA under an Emergency Use Authorization (EUA). This EUA will remain in effect (meaning this test can be used) for the duration of the COVID-19 declaration under Section 564(b)(1) of the Act, 21 U.S.C. section 360bbb-3(b)(1), unless the authorization is terminated or revoked.  Performed at [facility], [place] [address] [place], [place] [address]   MRSA Next Gen by PCR, Nasal     Status: None   Collection Time: 11/25/20  9:02 PM   Specimen: Nasal Mucosa; Nasal Swab  Result Value Ref Range Status   MRSA by PCR Next Gen NOT DETECTED NOT DETECTED Final    Comment: (NOTE) The GeneXpert MRSA Assay (FDA approved for NASAL specimens only), is one component of a comprehensive MRSA colonization surveillance program. It is not intended to diagnose MRSA infection nor to guide or monitor treatment for MRSA infections. Test performance is not FDA approved in patients less than 32 years old. Performed at [facility], [place] [address], [place], [place] [address]   Surgical  PCR screen     Status: None   Collection Time: 11/26/20  9:48 PM   Specimen: Nasal Mucosa; Nasal Swab  Result Value Ref Range Status   MRSA, PCR NEGATIVE NEGATIVE Final   Staphylococcus aureus NEGATIVE NEGATIVE Final    Comment: (NOTE) The Xpert SA Assay (FDA approved for NASAL specimens in patients 16 years of age  and older), is one component of a comprehensive surveillance program. It is not intended to diagnose infection nor to guide or monitor treatment. Performed at [facility], [place] [address] [place], [place] [address]      Time coordinating discharge: 45 minutes  SIGNED:   Tawni Millers, MD  [facility] 11/27/2020, 4:42 PM

## 2020-11-27 NOTE — Transfer of Care (Signed)
Immediate Anesthesia Transfer of Care Note  Patient: Antonio Horton  Procedure(s) Performed: LAPAROSCOPIC CHOLECYSTECTOMY  Patient Location: PACU  Anesthesia Type:General  Level of Consciousness: awake  Airway & Oxygen Therapy: Patient Spontanous Breathing and Patient connected to face mask oxygen  Post-op Assessment: Report given to RN and Post -op Vital signs reviewed and stable  Post vital signs: Reviewed and stable  Last Vitals:  Vitals Value Taken Time  BP 136/65 11/27/20 1201  Temp    Pulse 78 11/27/20 1204  Resp 12 11/27/20 1204  SpO2 96 % 11/27/20 1204  Vitals shown include unvalidated device data.  Last Pain:  Vitals:   11/27/20 0918  TempSrc:   PainSc: 0-No pain         Complications: No notable events documented.

## 2020-11-27 NOTE — Anesthesia Preprocedure Evaluation (Signed)
Anesthesia Evaluation  Patient identified by MRN, date of birth, ID band Patient awake    Reviewed: Allergy & Precautions, NPO status , Patient's Chart, lab work & pertinent test results  Airway Mallampati: III  TM Distance: >3 FB Neck ROM: Full    Dental  (+) Dental Advisory Given   Pulmonary sleep apnea and Continuous Positive Airway Pressure Ventilation ,    breath sounds clear to auscultation       Cardiovascular hypertension, Pt. on medications  Rhythm:Regular Rate:Normal     Neuro/Psych negative neurological ROS     GI/Hepatic negative GI ROS, Neg liver ROS,   Endo/Other  diabetes, Type 2, Oral Hypoglycemic AgentsMorbid obesity  Renal/GU      Musculoskeletal  (+) Arthritis ,   Abdominal   Peds  Hematology negative hematology ROS (+)   Anesthesia Other Findings   Reproductive/Obstetrics                             Lab Results  Component Value Date   WBC 9.4 11/27/2020   HGB 13.3 11/27/2020   HCT 39.5 11/27/2020   MCV 90.4 11/27/2020   PLT 169 11/27/2020   Lab Results  Component Value Date   CREATININE 1.00 11/27/2020   BUN 9 11/27/2020   NA 136 11/27/2020   K 3.4 (L) 11/27/2020   CL 103 11/27/2020   CO2 26 11/27/2020    Anesthesia Physical Anesthesia Plan  ASA: 3  Anesthesia Plan: General   Post-op Pain Management:    Induction: Intravenous  PONV Risk Score and Plan: 2 and Dexamethasone, Ondansetron and Treatment may vary due to age or medical condition  Airway Management Planned: Oral ETT  Additional Equipment: None  Intra-op Plan:   Post-operative Plan: Extubation in OR  Informed Consent: I have reviewed the patients History and Physical, chart, labs and discussed the procedure including the risks, benefits and alternatives for the proposed anesthesia with the patient or authorized representative who has indicated his/her understanding and acceptance.      Dental advisory given  Plan Discussed with: CRNA  Anesthesia Plan Comments:         Anesthesia Quick Evaluation

## 2020-11-27 NOTE — Progress Notes (Signed)
PROGRESS NOTE    Zaid Tomes  IFO:[id number] DOB: 06-Oct-1965 DOA: 11/24/2020 PCP: Pleas Koch, NP    Brief Narrative:  Mr. Brase was admitted to the hospital with the working diagnosis of symptomatic cholelithiasis, complicated with syncope and pneumonitis.    55 year old male past medical history for hypertension, type 2 diabetes mellitus, dyslipidemia and obstructive sleep apnea who presented with syncope episode.  Reported several days of intermittent abdominal pain, epigastric, radiated to bilateral upper quadrants.  Associated with multiple episodes of vomiting, including 3 syncope episodes while vomiting.  After last syncope episode patient experienced dyspnea on exertion.  On his initial physical examination his oximetry was in the 80s on room air, heart rate 79, respiratory rate 23, blood pressure 106/71, on supplemental oxygen oximetry 94%.  His lungs had diminished breath sounds, bibasilar rales and no wheezing, heart S1-S2, present, rhythmic, abdomen diffusely tender, nondistended, mild lower extremity edema.   Sodium 136, potassium 3.4, chloride 105, bicarb 18, glucose 187, BUN 16, creatinine 1.0, lipase 26, AST 17, ALT 19.  Lactic acid 2.1, white count 13.8, hemoglobin 17.1, hematocrit 49.4, platelets 246. SARS COVID-19 negative.   Urine analysis specific gravity > 1.046, negative nitrates.  Negative leukocytes.   Chest radiograph negative for infiltrates. CT chest abdomen pelvis.  A sending aneurysm of the aorta 5.2 cm. Cholelithiasis without gallbladder thickening or biliary dilatation. 4.1 cm low-density lesion in the right hepatic lobe above the typical density of fluid but appears thin walled and uncomplicated.    Cholelithiasis without sonographic evidence of acute cholecystitis. Heterogeneous liver.   EKG 83 bpm, left axis deviation, normal intervals, sinus rhythm, no significant ST segment T wave changes.   Patient with no further nausea and  vomiting. Plan for cholecystectomy today    Assessment & Plan:   Principal Problem:   Cholelithiases Active Problems:   Type 2 diabetes mellitus (HCC)   Obstructive sleep apnea   Hypotension   Pneumonitis   Nausea and vomiting   Syncope   Hypoxemia   Symptomatic cholelithiasis. Sepsis ruled out.  It has been complicated with vasovagal syncope and aspiration pneumonitis.    Liver MRi with mild diffuse hepatic steatosis and slight nodularity at the hepatic contour possible cirrhosis. No ascites or splenomegaly.  3.8 cm hepatic cyst.  Hypoxemic respiratory failure due to pneumonitis has resolved.    No further nausea or vomiting, wbc today is 9.4 and patient has been afebrile.  Plan for cholecystectomy today.  Follow up with surgery recommendations post op.    2. T2DM/ dyslipidemia  Fasting glucose is 140, well controlled for inpatient criteria.  On insulin sliding scale for glucose cover and monitoring.  Patient has been npo after midnight.  Continue with statin therapy.    3. HTN/ Ascending aortic aneurysm  Well controlled blood pressure, this am 121//76, one measurement of 98/43 question if accurate. Patient is awake and alert.   Outpatient follow up for aneurysm.    Continue holding on antihypertensive medications, (at home patient on lisinopril and HCTZ).    4. 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  Calculated BMI is 39,5    5. Incidental thyroid nodule. Follow up as outpatient.   6. Hypokalemia. Stable renal function with serum cr at 1,0, Na is 136, Cl 103 and K at 3,4, with serum bicarbonate at 26. Patient off IV fluids Add 40 meq Kcl IV this am.  Follow electrolytes in am.   Status is: Inpatient  Remains inpatient appropriate because: surgical intervention  DVT prophylaxis: Enoxaparin   Code Status:    full  Family Communication:  I spoke with patient's wife and daughter at the bedside, we talked in detail about patient's condition, plan of care and prognosis  and all questions were addressed.     Consultants:  Surgery     Subjective: Patient is feeling well, no nausea or vomiting, abdominal pain continue to improve but not yet back to baseline.   Objective: Vitals:   11/26/20 2000 11/26/20 2001 11/27/20 0543 11/27/20 0732  BP: (!) 110/58 (!) 110/58 121/76 (!) 98/43  Pulse: 74 71 64   Resp: (!) 22 20 20 20   Temp:  98.6 F (37 C) (!) 97.5 F (36.4 C) 97.7 F (36.5 C)  TempSrc:  Oral Oral Oral  SpO2: 95% 94% 99% 98%  Weight:      Height:        Intake/Output Summary (Last 24 hours) at 11/27/2020 0831 Last data filed at 11/27/2020 0500 Gross per 24 hour  Intake 420 ml  Output 550 ml  Net -130 ml   Filed Weights   11/24/20 2137 11/25/20 1928  Weight: 136.1 kg (!) 141.9 kg    Examination:   General: Not in pain or dyspnea  Neurology: Awake and alert, non focal  E ENT: no pallor, no icterus, oral mucosa moist Cardiovascular: No JVD. S1-S2 present, rhythmic, no gallops, rubs, or murmurs. No lower extremity edema. Pulmonary: positive breath sounds bilaterally, adequate air movement, no wheezing, rhonchi or rales. Gastrointestinal. Abdomen soft and non tender to superficial palpation Skin. No rashes Musculoskeletal: no joint deformities     Data Reviewed: I have personally reviewed following labs and imaging studies  CBC: Recent Labs  Lab 11/24/20 2154 11/24/20 2157 11/24/20 2158 11/25/20 0441 11/26/20 0201 11/27/20 0024  WBC 13.8*  --   --  15.1* 10.3 9.4  NEUTROABS 10.6*  --   --   --   --   --   HGB 17.1* 16.7 16.7 15.1 14.1 13.3  HCT 49.4 49.0 49.0 44.4 41.2 39.5  MCV 88.8  --   --  90.1 90.0 90.4  PLT 246  --   --  217 183 937   Basic Metabolic Panel: Recent Labs  Lab 11/24/20 2154 11/24/20 2157 11/24/20 2158 11/25/20 0441 11/26/20 0201 11/27/20 0024  NA 136 138 137 135 138 136  K 3.4* 3.4* 3.6 4.1 3.3* 3.4*  CL 105 105  --  106 106 103  CO2 18*  --   --  21* 27 26  GLUCOSE 187* 193*  --  155*  137* 140*  BUN 16 18  --  14 10 9   CREATININE 1.02 1.00  --  0.98 1.05 1.00  CALCIUM 8.4*  --   --  8.1* 8.4* 8.4*  MG  --   --   --   --  1.7  --    GFR: Estimated Creatinine Clearance: 126.5 mL/min (by C-G formula based on SCr of 1 mg/dL). Liver Function Tests: Recent Labs  Lab 11/24/20 2154 11/27/20 0024  AST 17 12*  ALT 19 11  ALKPHOS 37* 38  BILITOT 0.9 0.8  PROT 6.2* 5.2*  ALBUMIN 3.2* 2.7*   Recent Labs  Lab 11/24/20 2154  LIPASE 26   No results for input(s): AMMONIA in the last 168 hours. Coagulation Profile: Recent Labs  Lab 11/24/20 2154  INR 1.2   Cardiac Enzymes: No results for input(s): CKTOTAL, CKMB, CKMBINDEX, TROPONINI in the last 168 hours. BNP (  last 3 results) No results for input(s): PROBNP in the last 8760 hours. HbA1C: Recent Labs    11/25/20 0442  HGBA1C 6.6*   CBG: Recent Labs  Lab 11/26/20 0628 11/26/20 1153 11/26/20 1610 11/26/20 2157 11/27/20 0650  GLUCAP 120* 124* 122* 162* 134*   Lipid Profile: No results for input(s): CHOL, HDL, LDLCALC, TRIG, CHOLHDL, LDLDIRECT in the last 72 hours. Thyroid Function Tests: No results for input(s): TSH, T4TOTAL, FREET4, T3FREE, THYROIDAB in the last 72 hours. Anemia Panel: No results for input(s): VITAMINB12, FOLATE, FERRITIN, TIBC, IRON, RETICCTPCT in the last 72 hours.    Radiology Studies: I have reviewed all of the imaging during this hospital visit personally     Scheduled Meds:  docusate sodium  100 mg Oral BID   enoxaparin (LOVENOX) injection  40 mg Subcutaneous Daily   insulin aspart  0-15 Units Subcutaneous TID WC & HS   pantoprazole  40 mg Oral Daily   polyethylene glycol  17 g Oral Daily   rosuvastatin  5 mg Oral QPM   Continuous Infusions:   ceFAZolin (ANCEF) IV       LOS: 2 days        Amere Iott Gerome Apley, MD

## 2020-11-27 NOTE — Plan of Care (Signed)
Discussed with patient plan of care for the evening, pain management and pre-procedure checklist with some teach back displayed   Problem: Education: Goal: Knowledge of General Education information will improve Description: Including pain rating scale, medication(s)/side effects and non-pharmacologic comfort measures Outcome: Progressing   Problem: Activity: Goal: Risk for activity intolerance will decrease Outcome: Progressing   Problem: Pain Managment: Goal: General experience of comfort will improve Outcome: Progressing

## 2020-11-27 NOTE — Discharge Instructions (Addendum)
[facility] ______[facility] °LAPAROSCOPIC SURGERY: POST OP INSTRUCTIONS °Always review your discharge instruction sheet given to you by the facility where your surgery was performed. °IF YOU HAVE DISABILITY OR FAMILY LEAVE FORMS, YOU MUST BRING THEM TO THE OFFICE FOR PROCESSING.   °DO NOT GIVE THEM TO YOUR DOCTOR. ° °A prescription for pain medication may be given to you upon discharge.  Take your pain medication as prescribed, if needed.  If narcotic pain medicine is not needed, then you may take acetaminophen (Tylenol) or ibuprofen (Advil) as needed. °Take your usually prescribed medications unless otherwise directed. °If you need a refill on your pain medication, please contact your pharmacy.  They will contact our office to request authorization. Prescriptions will not be filled after 5pm or on week-ends. °You should follow a light diet the first few days after arrival home, such as soup and crackers, etc.  Be sure to include lots of fluids daily. °Most patients will experience some swelling and bruising in the area of the incisions.  Ice packs will help.  Swelling and bruising can take several days to resolve.  °It is common to experience some constipation if taking pain medication after surgery.  Increasing fluid intake and taking a stool softener (such as Colace) will usually help or prevent this problem from occurring.  A mild laxative (Milk of Magnesia or Miralax) should be taken according to package instructions if there are no bowel movements after 48 hours. °Unless discharge instructions indicate otherwise, you may remove your bandages 24-48 hours after surgery, and you may shower at that time.  You may have steri-strips (small skin tapes) in place directly over the incision.  These strips should be left on the skin for 7-10 days.  If your surgeon used skin glue on the incision, you may shower in 24 hours.  The glue will flake off over the next 2-3 weeks.  Any sutures or staples will be  removed at the office during your follow-up visit. °ACTIVITIES:  You may resume regular (light) daily activities beginning the next day--such as daily self-care, walking, climbing stairs--gradually increasing activities as tolerated.  You may have sexual intercourse when it is comfortable.  Refrain from any heavy lifting or straining until approved by your doctor. °You may drive when you are no longer taking prescription pain medication, you can comfortably wear a seatbelt, and you can safely maneuver your car and apply brakes. °RETURN TO WORK:  __________________________________________________________ °You should see your doctor in the office for a follow-up appointment approximately 2-3 weeks after your surgery.  Make sure that you call for this appointment within a day or two after you arrive home to insure a convenient appointment time. °OTHER INSTRUCTIONS: __________________________________________________________________________________________________________________________ __________________________________________________________________________________________________________________________ °WHEN TO CALL YOUR DOCTOR: °Fever over 101.0 °Inability to urinate °Continued bleeding from incision. °Increased pain, redness, or drainage from the incision. °Increasing abdominal pain ° °The clinic staff is available to answer your questions during regular business hours.  Please don’t hesitate to call and ask to speak to one of the nurses for clinical concerns.  If you have a medical emergency, go to the nearest emergency room or call 911.  A surgeon from [facility] is always on call at the hospital. °[address], [address], [place], [place]  [address] ? [address] [address], , [place]   [address] °[phone] ? [phone] ? FAX [phone] °Web site: [email or web address] ° ° ° °Managing Your Pain After Surgery Without Opioids ° ° ° °Thank you for   participating in our program to  help patients manage their pain after surgery without opioids. This is part of our effort to provide you with the best care possible, without exposing you or your family to the risk that opioids pose. ° °What pain can I expect after surgery? °You can expect to have some pain after surgery. This is normal. The pain is typically worse the day after surgery, and quickly begins to get better. °Many studies have found that many patients are able to manage their pain after surgery with Over-the-Counter (OTC) medications such as Tylenol and Motrin. If you have a condition that does not allow you to take Tylenol or Motrin, notify your surgical team. ° °How will I manage my pain? °The best strategy for controlling your pain after surgery is around the clock pain control with Tylenol (acetaminophen) and Motrin (ibuprofen or Advil). Alternating these medications with each other allows you to maximize your pain control. In addition to Tylenol and Motrin, you can use heating pads or ice packs on your incisions to help reduce your pain. ° °How will I alternate your regular strength over-the-counter pain medication? °You will take a dose of pain medication every three hours. °Start by taking 650 mg of Tylenol (2 pills of 325 mg) °3 hours later take 600 mg of Motrin (3 pills of 200 mg) °3 hours after taking the Motrin take 650 mg of Tylenol °3 hours after that take 600 mg of Motrin. ° ° °- 1 - ° °See example - if your first dose of Tylenol is at 12:00 PM ° ° °12:00 PM Tylenol 650 mg (2 pills of 325 mg)  °3:00 PM Motrin 600 mg (3 pills of 200 mg)  °6:00 PM Tylenol 650 mg (2 pills of 325 mg)  °9:00 PM Motrin 600 mg (3 pills of 200 mg)  °Continue alternating every 3 hours  ° °We recommend that you follow this schedule around-the-clock for at least 3 days after surgery, or until you feel that it is no longer needed. Use the table on the last page of this handout to keep track of the medications you are taking. °Important: °Do not take  more than 3000mg of Tylenol or 3200mg of Motrin in a 24-hour period. °Do not take ibuprofen/Motrin if you have a history of bleeding stomach ulcers, severe kidney disease, &/or actively taking a blood thinner ° °What if I still have pain? °If you have pain that is not controlled with the over-the-counter pain medications (Tylenol and Motrin or Advil) you might have what we call “breakthrough” pain. You will receive a prescription for a small amount of an opioid pain medication such as Oxycodone, Tramadol, or Tylenol with Codeine. Use these opioid pills in the first 24 hours after surgery if you have breakthrough pain. Do not take more than 1 pill every 4-6 hours. ° °If you still have uncontrolled pain after using all opioid pills, don't hesitate to call our staff using the number provided. We will help make sure you are managing your pain in the best way possible, and if necessary, we can provide a prescription for additional pain medication. ° ° °Day 1   ° °Time  °Name of Medication Number of pills taken  °Amount of Acetaminophen  °Pain Level  ° °Comments  °AM PM       °AM PM       °AM PM       °AM PM       °AM PM       °  AM PM       °AM PM       °AM PM       °Total Daily amount of Acetaminophen °Do not take more than  3,000 mg per day    ° ° °Day 2   ° °Time  °Name of Medication Number of pills °taken  °Amount of Acetaminophen  °Pain Level  ° °Comments  °AM PM       °AM PM       °AM PM       °AM PM       °AM PM       °AM PM       °AM PM       °AM PM       °Total Daily amount of Acetaminophen °Do not take more than  3,000 mg per day    ° ° °Day 3   ° °Time  °Name of Medication Number of pills taken  °Amount of Acetaminophen  °Pain Level  ° °Comments  °AM PM       °AM PM       °AM PM       °AM PM       ° ° ° °AM PM       °AM PM       °AM PM       °AM PM       °Total Daily amount of Acetaminophen °Do not take more than  3,000 mg per day    ° ° °Day 4   ° °Time  °Name of Medication Number of pills taken  °Amount of  Acetaminophen  °Pain Level  ° °Comments  °AM PM       °AM PM       °AM PM       °AM PM       °AM PM       °AM PM       °AM PM       °AM PM       °Total Daily amount of Acetaminophen °Do not take more than  3,000 mg per day    ° ° °Day 5   ° °Time  °Name of Medication Number °of pills taken  °Amount of Acetaminophen  °Pain Level  ° °Comments  °AM PM       °AM PM       °AM PM       °AM PM       °AM PM       °AM PM       °AM PM       °AM PM       °Total Daily amount of Acetaminophen °Do not take more than  3,000 mg per day    ° ° ° °Day 6   ° °Time  °Name of Medication Number of pills °taken  °Amount of Acetaminophen  °Pain Level  °Comments  °AM PM       °AM PM       °AM PM       °AM PM       °AM PM       °AM PM       °AM PM       °AM PM       °Total Daily amount of Acetaminophen °Do not take more than  3,000 mg per day    ° ° °Day 7   ° °Time  °  Name of Medication Number of pills taken  °Amount of Acetaminophen  °Pain Level  ° °Comments  °AM PM       °AM PM       °AM PM       °AM PM       °AM PM       °AM PM       °AM PM       °AM PM       °Total Daily amount of Acetaminophen °Do not take more than  3,000 mg per day    ° ° ° ° °For additional information about how and where to safely dispose of unused opioid °medications - https://www.morepowerfulnc.org ° °Disclaimer: This document contains information and/or instructional materials adapted from [facility] for the typical patient with your condition. It does not replace medical advice from your health care provider because your experience may differ from that of the °typical patient. Talk to your health care provider if you have any questions about this °document, your condition or your treatment plan. °Adapted from [facility] ° °

## 2020-11-27 NOTE — Progress Notes (Signed)
Patient ID: Antonio Horton, male   DOB: 08-17-1965, 55 y.o.   MRN: [id number] Day of Surgery   Subjective: Gas pains. A bit better ROS negative except as listed above. Objective: Vital signs in last 24 hours: Temp:  [97.5 F (36.4 C)-98.7 F (37.1 C)] 98.7 F (37.1 C) (11/08 0904) Pulse Rate:  [64-74] 71 (11/08 0904) Resp:  [13-22] 20 (11/08 0904) BP: (98-124)/(43-79) 108/44 (11/08 0904) SpO2:  [94 %-99 %] 96 % (11/08 0904) Weight:  [140.6 kg] 140.6 kg (11/08 0904) Last BM Date:  (PTA)  Intake/Output from previous day: 11/07 0701 - 11/08 0700 In: 420 [P.O.:420] Out: 550 [Urine:550] Intake/Output this shift: No intake/output data recorded.  General appearance: alert and cooperative Resp: clear to auscultation bilaterally Cardio: regular rate and rhythm GI: soft, NT  Lab Results: CBC  Recent Labs    11/26/20 0201 11/27/20 0024  WBC 10.3 9.4  HGB 14.1 13.3  HCT 41.2 39.5  PLT 183 169   BMET Recent Labs    11/26/20 0201 11/27/20 0024  NA 138 136  K 3.3* 3.4*  CL 106 103  CO2 27 26  GLUCOSE 137* 140*  BUN 10 9  CREATININE 1.05 1.00  CALCIUM 8.4* 8.4*   PT/INR Recent Labs    11/24/20 2154  LABPROT 15.0  INR 1.2   ABG Recent Labs    11/24/20 2158  HCO3 22.3    Studies/Results: MR LIVER W WO CONTRAST  Result Date: 11/25/2020 CLINICAL DATA:  Further evaluation of hepatic lesions seen on ultrasound. EXAM: MRI ABDOMEN WITHOUT AND WITH CONTRAST TECHNIQUE: Multiplanar multisequence MR imaging of the abdomen was performed both before and after the administration of intravenous contrast. CONTRAST:  105mL GADAVIST GADOBUTROL 1 MMOL/ML IV SOLN COMPARISON:  Ultrasound November 25, 2020 and CT November 24, 2020. FINDINGS: Lower chest: Heterogeneous signal in the dependent lungs favored atelectasis. Hepatobiliary: Mild diffuse hepatic steatosis. Slight nodularity of the hepatic contour possibly reflecting changes of cirrhosis. Well-circumscribed T2 hyperintense  nonenhancing 3.8 cm hepatic cyst on image 28/3 which corresponds with the lesion seen on prior CT and ultrasound. No suspicious hepatic lesion. Cholelithiasis in a distended gallbladder without pericholecystic fluid. No biliary ductal dilation. The common bile duct measures 4 mm. No choledocholithiasis. Pancreas: Intrinsic T1 signal of the pancreatic parenchyma is within normal limits. No pancreatic ductal dilation. No suspicious pancreatic mass visualized. Spleen:  Within normal limits. Adrenals/Urinary Tract: Bilateral adrenal glands are unremarkable. No hydronephrosis. No solid enhancing renal mass. Stomach/Bowel: No evidence of acute bowel inflammation or obstruction. Colonic diverticulosis without findings of acute diverticulitis. Vascular/Lymphatic: No abdominal aortic aneurysm. The portal, splenic and superior mesenteric veins are patent. Prominent periportal lymph nodes measuring up to 8 mm. Other:  No abdominal ascites. Musculoskeletal: No suspicious bone lesions identified. IMPRESSION: 1. Mild diffuse hepatic steatosis with slight nodularity of the hepatic contour possibly reflecting changes of cirrhosis. No ascites or splenomegaly. 2. Well-circumscribed nonenhancing 3.8 cm hepatic cyst which corresponds with the lesion seen on prior CT. No suspicious hepatic lesion. 3. Cholelithiasis in a distended gallbladder without pericholecystic fluid. No biliary ductal dilation or choledocholithiasis. Electronically Signed   By: Dahlia Bailiff M.D.   On: 11/25/2020 18:37    Anti-infectives: Anti-infectives (From admission, onward)    Start     Dose/Rate Route Frequency Ordered Stop   11/27/20 0600  [MAR Hold]  ceFAZolin (ANCEF) IVPB 3g/100 mL premix        (MAR Hold since Tue 11/27/2020 at 0901.Hold Reason: Transfer to a Procedural area)  3 g 200 mL/hr over 30 Minutes Intravenous To Short Stay 11/26/20 1402 11/28/20 0600   11/25/20 1700  piperacillin-tazobactam (ZOSYN) IVPB 3.375 g  Status:  Discontinued         3.375 g 12.5 mL/hr over 240 Minutes Intravenous Every 8 hours 11/25/20 1345 11/26/20 0846   11/25/20 1000  metroNIDAZOLE (FLAGYL) IVPB 500 mg  Status:  Discontinued        500 mg 100 mL/hr over 60 Minutes Intravenous Every 12 hours 11/25/20 0255 11/25/20 1329   11/25/20 1000  vancomycin (VANCOREADY) IVPB 1500 mg/300 mL  Status:  Discontinued        1,500 mg 150 mL/hr over 120 Minutes Intravenous Every 12 hours 11/25/20 0123 11/25/20 1329   11/25/20 0600  ceFEPIme (MAXIPIME) 2 g in sodium chloride 0.9 % 100 mL IVPB  Status:  Discontinued        2 g 200 mL/hr over 30 Minutes Intravenous Every 8 hours 11/25/20 0119 11/25/20 1329   11/25/20 0300  ceFEPIme (MAXIPIME) 2 g in sodium chloride 0.9 % 100 mL IVPB  Status:  Discontinued        2 g 200 mL/hr over 30 Minutes Intravenous  Once 11/25/20 0255 11/25/20 0303   11/25/20 0300  vancomycin (VANCOREADY) IVPB 1000 mg/200 mL  Status:  Discontinued        1,000 mg 200 mL/hr over 60 Minutes Intravenous  Once 11/25/20 0255 11/25/20 0302   11/24/20 2200  ceFEPIme (MAXIPIME) 2 g in sodium chloride 0.9 % 100 mL IVPB        2 g 200 mL/hr over 30 Minutes Intravenous  Once 11/24/20 2154 11/25/20 0008   11/24/20 2200  metroNIDAZOLE (FLAGYL) IVPB 500 mg        500 mg 100 mL/hr over 60 Minutes Intravenous  Once 11/24/20 2154 11/25/20 0008   11/24/20 2200  vancomycin (VANCOREADY) IVPB 2000 mg/400 mL        2,000 mg 200 mL/hr over 120 Minutes Intravenous  Once 11/24/20 2154 11/25/20 0253       Assessment/Plan: Biliary Colic - for laparoscopic cholecystectomy this AM. Procedure, risks, and benefits discussed and he agrees. 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

## 2020-11-28 ENCOUNTER — Telehealth: Payer: Self-pay

## 2020-11-28 ENCOUNTER — Encounter (HOSPITAL_COMMUNITY): Payer: Self-pay | Admitting: General Surgery

## 2020-11-28 LAB — SURGICAL PATHOLOGY

## 2020-11-28 NOTE — Telephone Encounter (Signed)
Transition Care Management Follow-up Telephone Call Date of discharge and from where: 11/27/20 from [facility] How have you been since you were released from the hospital? Patient states doing ok since discharge. Any questions or concerns? No  Items Reviewed: Did the pt receive and understand the discharge instructions provided? Yes  Medications obtained and verified? Yes  Other? No  Any new allergies since your discharge? No  Dietary orders reviewed? Yes Do you have support at home? Yes , wife and daughter  [place] and Equipment/Supplies: Were home health services ordered? no If so, what is the name of the agency? N/A  Has the agency set up a time to come to the patient's home? not applicable Were any new equipment or medical supplies ordered?  No What is the name of the medical supply agency? N/A Were you able to get the supplies/equipment? not applicable Do you have any questions related to the use of the equipment or supplies? No  Functional Questionnaire: (I = Independent and D = Dependent) ADLs: I   Bathing/Dressing- I  Meal Prep- I with assistance from wife  Eating- I  Maintaining continence- I  Transferring/Ambulation- I  Managing Meds- I  Follow up appointments reviewed:  PCP Hospital f/u appt confirmed? Patient will call to schedule appointment [facility] f/u appt confirmed? Yes  Scheduled to see [facility] on 12/20/20 @ 1:45pm. Are transportation arrangements needed? No  If their condition worsens, is the pt aware to call PCP or go to the Emergency Dept.? Yes Was the patient provided with contact information for the PCP's office or ED? Yes Was to pt encouraged to call back with questions or concerns? Yes

## 2020-11-29 LAB — CULTURE, BLOOD (ROUTINE X 2)
Culture: NO GROWTH
Culture: NO GROWTH
Special Requests: ADEQUATE

## 2020-11-30 NOTE — Anesthesia Postprocedure Evaluation (Signed)
Anesthesia Post Note  Patient: Antonio Horton  Procedure(s) Performed: LAPAROSCOPIC CHOLECYSTECTOMY     Patient location during evaluation: PACU Anesthesia Type: General Level of consciousness: awake and alert Pain management: pain level controlled Vital Signs Assessment: post-procedure vital signs reviewed and stable Respiratory status: spontaneous breathing, nonlabored ventilation, respiratory function stable and patient connected to nasal cannula oxygen Cardiovascular status: blood pressure returned to baseline and stable Postop Assessment: no apparent nausea or vomiting Anesthetic complications: no   No notable events documented.  Last Vitals:  Vitals:   11/27/20 1230 11/27/20 1244  BP:  109/63  Pulse: 76   Resp: 16   Temp:    SpO2: 94%     Last Pain:  Vitals:   11/27/20 1428  TempSrc:   PainSc: Asleep                 Jivan Tessie Ordaz

## 2020-12-04 ENCOUNTER — Ambulatory Visit (INDEPENDENT_AMBULATORY_CARE_PROVIDER_SITE_OTHER): Payer: 59 | Admitting: Primary Care

## 2020-12-04 ENCOUNTER — Encounter: Payer: Self-pay | Admitting: Primary Care

## 2020-12-04 ENCOUNTER — Other Ambulatory Visit: Payer: Self-pay

## 2020-12-04 VITALS — BP 120/78 | HR 82 | Temp 97.6°F | Ht 73.0 in | Wt 298.0 lb

## 2020-12-04 DIAGNOSIS — K760 Fatty (change of) liver, not elsewhere classified: Secondary | ICD-10-CM

## 2020-12-04 DIAGNOSIS — Z8719 Personal history of other diseases of the digestive system: Secondary | ICD-10-CM

## 2020-12-04 DIAGNOSIS — I10 Essential (primary) hypertension: Secondary | ICD-10-CM

## 2020-12-04 DIAGNOSIS — K8020 Calculus of gallbladder without cholecystitis without obstruction: Secondary | ICD-10-CM

## 2020-12-04 DIAGNOSIS — I7121 Aneurysm of the ascending aorta, without rupture: Secondary | ICD-10-CM | POA: Diagnosis not present

## 2020-12-04 DIAGNOSIS — I2510 Atherosclerotic heart disease of native coronary artery without angina pectoris: Secondary | ICD-10-CM

## 2020-12-04 DIAGNOSIS — E042 Nontoxic multinodular goiter: Secondary | ICD-10-CM | POA: Diagnosis not present

## 2020-12-04 DIAGNOSIS — J189 Pneumonia, unspecified organism: Secondary | ICD-10-CM

## 2020-12-04 LAB — CBC
HCT: 46.8 % (ref 39.0–52.0)
Hemoglobin: 15.6 g/dL (ref 13.0–17.0)
MCHC: 33.3 g/dL (ref 30.0–36.0)
MCV: 89.9 fl (ref 78.0–100.0)
Platelets: 303 10*3/uL (ref 150.0–400.0)
RBC: 5.21 Mil/uL (ref 4.22–5.81)
RDW: 12.9 % (ref 11.5–15.5)
WBC: 8.4 10*3/uL (ref 4.0–10.5)

## 2020-12-04 LAB — TSH: TSH: 1.6 u[IU]/mL (ref 0.35–5.50)

## 2020-12-04 LAB — COMPREHENSIVE METABOLIC PANEL
ALT: 21 U/L (ref 0–53)
AST: 18 U/L (ref 0–37)
Albumin: 4.1 g/dL (ref 3.5–5.2)
Alkaline Phosphatase: 55 U/L (ref 39–117)
BUN: 13 mg/dL (ref 6–23)
CO2: 30 mEq/L (ref 19–32)
Calcium: 9.7 mg/dL (ref 8.4–10.5)
Chloride: 98 mEq/L (ref 96–112)
Creatinine, Ser: 0.98 mg/dL (ref 0.40–1.50)
GFR: 87.78 mL/min (ref >60.00)
Glucose, Bld: 129 mg/dL — ABNORMAL HIGH (ref 70–99)
Potassium: 4.8 mEq/L (ref 3.5–5.1)
Sodium: 136 mEq/L (ref 135–145)
Total Bilirubin: 0.8 mg/dL (ref 0.2–1.2)
Total Protein: 7.2 g/dL (ref 6.0–8.3)

## 2020-12-04 NOTE — Progress Notes (Signed)
Subjective:    Patient ID: Antonio Horton, male    DOB: 06/23/1965, 55 y.o.   MRN: [id number]  HPI  Antonio Horton is a very pleasant 55 y.o. male with a history of hypertension, OSA, type 2 diabetes, obesity who presents today for hospital follow-up.  He presented to [facility] ED on 11/24/2020 for lightheadedness, weakness, syncope, upper quadrant abdominal pain.  Upon arrival to ED he was noted to be hypotensive, hypoxic with saturation of 87% on room air.  Work-up in the ED found pneumonitis, incidental findings, cholelithiasis.  He was admitted for further evaluation.  During his hospital stay he was treated with IV fluids, IV antibiotics, supplemental oxygen.  He underwent laparoscopic cholecystectomy on 11/27/2020.  He was discharged home later that day with recommendations for PCP follow-up and surgery follow-up.  Also during his hospital stay he was noted to have numerous incidental findings from imaging.  CTA chest/abdomen/pelvis:   -Ascending aortic aneurysm measuring 5.2 cm. -Right coronary artery calcification -Questionable early cirrhosis of the liver -4.1 cm hepatic lobe mass -Diverticulosis -Thyroid nodule  MRI Liver:  -Hepatic steatosis -questionable early cirrhosis -3.8 cm hepatic cyst  Since his discharge home he's feeling better. He denies nausea, vomiting, abdominal pain, syncope.  He does have abdominal soreness from his surgery, but this is improving.  He is scheduled for follow up with his surgeon on 12/20/20.  He is adopted and has no family history information.  He was unaware of all of these incidental findings.  He is a social drinker of alcohol.  He checks his BP at home which typically runs 120's/70's.   He typically denies chest pain, shortness of breath, dizziness outside of his recent event requiring hospitalization.  BP Readings from Last 3 Encounters:  12/04/20 120/78  11/27/20 109/63  06/14/20 133/84      Review of Systems   Constitutional:  Negative for fatigue and fever.  Respiratory:  Negative for shortness of breath.   Cardiovascular:  Negative for chest pain.  Skin:  Negative for color change.  Neurological:  Negative for dizziness, syncope and headaches.        Past Medical History:  Diagnosis Date   Borderline diabetes    Diabetes mellitus without complication ([place])    Hyperlipidemia    Hypertension    Sleep apnea    wears CPAP   Vasovagal reaction     Social History   Socioeconomic History   Marital status: Married    Spouse name: Not on file   Number of children: Not on file   Years of education: Not on file   Highest education level: Not on file  Occupational History   Not on file  Tobacco Use   Smoking status: Never   Smokeless tobacco: Never  Vaping Use   Vaping Use: Never used  Substance and Sexual Activity   Alcohol use: Not Currently    Alcohol/week: 0.0 standard drinks    Comment: socially   Drug use: No   Sexual activity: Not on file  Other Topics Concern   Not on file  Social History Narrative   Married.   2 children.   Works as a Administrator, sports.   Enjoys reading.    Social Determinants of Health   Financial Resource Strain: Not on file  Food Insecurity: Not on file  Transportation Needs: Not on file  Physical Activity: Not on file  Stress: Not on file  Social Connections: Not on file  Intimate Partner Violence: Not on file  Past Surgical History:  Procedure Laterality Date   CHOLECYSTECTOMY N/A 11/27/2020   Procedure: LAPAROSCOPIC CHOLECYSTECTOMY;  Surgeon: Georganna Skeans, MD;  Location: [facility];  Service: General;  Laterality: N/A;   EYE SURGERY     Corneal dystrophies    Family History  Adopted: Yes  Problem Relation Age of Onset   Other Other        adopted    No Known Allergies  Current Outpatient Medications on File Prior to Visit  Medication Sig Dispense Refill   lisinopril-hydrochlorothiazide (ZESTORETIC) 10-12.5 MG tablet TAKE  1 TABLET BY MOUTH DAILY. FOR BLOOD PRESSURE. OFFICE VISIT REQUIRED FOR FURTHER REFILLS. 30 tablet 0   metFORMIN (GLUCOPHAGE-XR) 500 MG 24 hr tablet Take 1 tablet (500 mg total) by mouth daily with breakfast. For diabetes. 90 tablet 1   prednisoLONE acetate (PRED FORTE) 1 % ophthalmic suspension Place 1 drop into the left eye daily.      rosuvastatin (CRESTOR) 5 MG tablet Take 1 tablet (5 mg total) by mouth every evening. For cholesterol. Office visit required for further refills. 90 tablet 0   acetaminophen (TYLENOL) 325 MG tablet Take 2 tablets (650 mg total) by mouth every 6 (six) hours as needed for mild pain. (Patient not taking: Reported on 12/04/2020)     docusate sodium (COLACE) 100 MG capsule Take 1 capsule (100 mg total) by mouth daily as needed for mild constipation or moderate constipation. (Patient not taking: Reported on 12/04/2020)  0   ibuprofen (ADVIL) 800 MG tablet Take 1 tablet (800 mg total) by mouth every 8 (eight) hours as needed for mild pain or moderate pain. (Patient not taking: Reported on 12/04/2020)     pantoprazole (PROTONIX) 40 MG tablet Take 1 tablet (40 mg total) by mouth daily for 7 days. (Patient not taking: Reported on 12/04/2020) 7 tablet 0   No current facility-administered medications on file prior to visit.    BP 120/78   Pulse 82   Temp 97.6 F (36.4 C) (Temporal)   Ht 6\' 1"  (1.854 m)   Wt 298 lb (135.2 kg)   SpO2 97%   BMI 39.32 kg/m  Objective:   Physical Exam Cardiovascular:     Rate and Rhythm: Normal rate and regular rhythm.  Pulmonary:     Effort: Pulmonary effort is normal.     Breath sounds: Normal breath sounds. No wheezing or rales.  Musculoskeletal:     Cervical back: Neck supple.  Skin:    General: Skin is warm and dry.     Findings: No erythema.     Comments: Surgical sites appear to be healing well  Neurological:     Mental Status: He is alert and oriented to person, place, and time.          Assessment & Plan:       This visit occurred during the SARS-CoV-2 public health emergency.  Safety protocols were in place, including screening questions prior to the visit, additional usage of staff PPE, and extensive cleaning of exam room while observing appropriate contact time as indicated for disinfecting solutions.

## 2020-12-04 NOTE — Patient Instructions (Signed)
Stop by the lab prior to leaving today. I will notify you of your results once received.   You will be contacted regarding your referral to cardiothoracic surgery and for the thyroid ultrasound.  Please let us know if you have not been contacted within two weeks.   We will see you in 3 months for your physical.  It was a pleasure to see you today!

## 2020-12-05 ENCOUNTER — Encounter: Payer: Self-pay | Admitting: Primary Care

## 2020-12-05 DIAGNOSIS — E042 Nontoxic multinodular goiter: Secondary | ICD-10-CM | POA: Insufficient documentation

## 2020-12-05 DIAGNOSIS — I7121 Aneurysm of the ascending aorta, without rupture: Secondary | ICD-10-CM | POA: Insufficient documentation

## 2020-12-05 DIAGNOSIS — K760 Fatty (change of) liver, not elsewhere classified: Secondary | ICD-10-CM

## 2020-12-05 DIAGNOSIS — I2510 Atherosclerotic heart disease of native coronary artery without angina pectoris: Secondary | ICD-10-CM | POA: Insufficient documentation

## 2020-12-05 HISTORY — DX: Fatty (change of) liver, not elsewhere classified: K76.0

## 2020-12-05 NOTE — Assessment & Plan Note (Signed)
Incidental finding from CTA during hospitalization in November 2022  Reviewed MRI abdomen. Questionable early cirrhosis.  Referral placed to GI for further evaluation. Liver enzymes appear to be unremarkable.  Social drinker of alcohol.

## 2020-12-05 NOTE — Assessment & Plan Note (Signed)
Mild. Incidental finding from CTA during hospitalization in November 2022.  Managed on statin, LDL of 49 in 2021. Blood pressure under excellent control. Diabetes under good control.  Repeat lipid panel at upcoming physical. Discussed results in great detail with patient and his wife.  Strongly advised weight loss through healthy diet and exercise.

## 2020-12-05 NOTE — Assessment & Plan Note (Signed)
Incidental finding on CTA during hospitalization in November 2022.  Thyroid ultrasound pending. TSH unremarkable.

## 2020-12-05 NOTE — Assessment & Plan Note (Signed)
Controlled in the office today, continue lisinopril-hydrochlorothiazide 10-12.5mg .

## 2020-12-05 NOTE — Assessment & Plan Note (Signed)
Recent hospitalization for pneumonitis.  Appears to have resolved, lungs clear on exam today.  No cough.  Hospital notes, labs, imaging reviewed.

## 2020-12-05 NOTE — Assessment & Plan Note (Signed)
Recent hospitalization, complications of syncope, hypotension, pneumonitis.  Appears to be doing well postoperatively, surgical sites appear to be healing well.  Follow-up with general surgery as scheduled. Hospital notes, labs, imaging reviewed.

## 2020-12-05 NOTE — Assessment & Plan Note (Signed)
Incidental finding on CTA during hospitalization.  We discussed his CT results in great detail.  Given his adoption status he does not have family history information.  Referral placed to cardiothoracic surgery given size of aortic aneurysm of 5.2 cm.  Asymptomatic.

## 2020-12-18 ENCOUNTER — Ambulatory Visit
Admission: RE | Admit: 2020-12-18 | Discharge: 2020-12-18 | Disposition: A | Discharge status: Home / Self Care | Payer: 59 | Source: Ambulatory Visit | Attending: Primary Care | Admitting: Primary Care

## 2020-12-18 DIAGNOSIS — E042 Nontoxic multinodular goiter: Secondary | ICD-10-CM

## 2020-12-19 DIAGNOSIS — E041 Nontoxic single thyroid nodule: Secondary | ICD-10-CM

## 2020-12-29 ENCOUNTER — Other Ambulatory Visit: Payer: Self-pay | Admitting: Primary Care

## 2020-12-29 DIAGNOSIS — I10 Essential (primary) hypertension: Secondary | ICD-10-CM

## 2021-01-01 ENCOUNTER — Encounter: Payer: Self-pay | Admitting: Thoracic Surgery (Cardiothoracic Vascular Surgery)

## 2021-01-01 ENCOUNTER — Other Ambulatory Visit: Payer: Self-pay

## 2021-01-01 ENCOUNTER — Other Ambulatory Visit (HOSPITAL_COMMUNITY)
Admission: RE | Admit: 2021-01-01 | Discharge: 2021-01-01 | Disposition: A | Discharge status: Home / Self Care | Payer: 59 | Source: Ambulatory Visit | Attending: Primary Care | Admitting: Primary Care

## 2021-01-01 ENCOUNTER — Institutional Professional Consult (permissible substitution) (INDEPENDENT_AMBULATORY_CARE_PROVIDER_SITE_OTHER): Payer: 59 | Admitting: Thoracic Surgery (Cardiothoracic Vascular Surgery)

## 2021-01-01 ENCOUNTER — Ambulatory Visit
Admission: RE | Admit: 2021-01-01 | Discharge: 2021-01-01 | Disposition: A | Discharge status: Home / Self Care | Payer: 59 | Source: Ambulatory Visit | Attending: Primary Care | Admitting: Primary Care

## 2021-01-01 VITALS — BP 118/82 | HR 68 | Resp 20 | Ht 73.0 in | Wt 305.0 lb

## 2021-01-01 DIAGNOSIS — I7121 Aneurysm of the ascending aorta, without rupture: Secondary | ICD-10-CM

## 2021-01-01 DIAGNOSIS — E041 Nontoxic single thyroid nodule: Secondary | ICD-10-CM

## 2021-01-01 NOTE — Progress Notes (Signed)
PCP is Pleas Koch, NP Referring Provider is Pleas Koch, NP  Chief Complaint  Patient presents with   Thoracic Aortic Aneurysm    Surgical consult, CTA Chest/ABD/Pelvis 11/24/20    HPI: Antonio Horton is sent for consultation regarding an ascending aortic aneurysm.  Antonio Horton is a 55 year old man with a past medical history significant for morbid obesity, hypertension, hyperlipidemia, sleep apnea, type 2 diabetes without complication, and vasovagal syncope.  Antonio Horton presented on 11/24/2020 with multiple syncopal episodes associated with abdominal pain, nausea, and vomiting.  Antonio Horton ultimately had a cholecystectomy by Dr. Grandville Silos.  As part of his work-up Antonio Horton had a CT of the chest abdomen and pelvis.  Antonio Horton was noted to have a 5.2 cm ascending aneurysm.  Antonio Horton is not having any chest pain, pressure, tightness.  Antonio Horton does have shortness of breath with heavy activity.  Does occasionally get some swelling in his legs.  No history of a heart murmur.  Antonio Horton is adopted so does not know his family history.  Antonio Horton did have a thyroid nodule aspirated yesterday.  Of note Antonio Horton says that with diet and exercise Antonio Horton is lost over 100 pounds in the past couple of years. Past Medical History:  Diagnosis Date   Borderline diabetes    COVID-19 virus infection 06/14/2020   Diabetes mellitus without complication ([place])    Hyperlipidemia    Hypertension    Hypoxemia 11/25/2020   Sepsis ([place]) 11/25/2020   Sleep apnea    wears CPAP   Syncope 11/25/2020   Vasovagal reaction     Past Surgical History:  Procedure Laterality Date   CHOLECYSTECTOMY N/A 11/27/2020   Procedure: LAPAROSCOPIC CHOLECYSTECTOMY;  Surgeon: Georganna Skeans, MD;  Location: [facility];  Service: General;  Laterality: N/A;   EYE SURGERY     Corneal dystrophies    Family History  Adopted: Yes  Problem Relation Age of Onset   Other Other        adopted    Social History Social History   Tobacco Use   Smoking status: Never   Smokeless tobacco:  Never  Vaping Use   Vaping Use: Never used  Substance Use Topics   Alcohol use: Not Currently    Alcohol/week: 0.0 standard drinks    Comment: socially   Drug use: No    Current Outpatient Medications  Medication Sig Dispense Refill   lisinopril-hydrochlorothiazide (ZESTORETIC) 10-12.5 MG tablet Take 1 tablet by mouth daily. For blood pressure. 90 tablet 2   metFORMIN (GLUCOPHAGE-XR) 500 MG 24 hr tablet Take 1 tablet (500 mg total) by mouth daily with breakfast. For diabetes. 90 tablet 1   prednisoLONE acetate (PRED FORTE) 1 % ophthalmic suspension Place 1 drop into the left eye daily.      rosuvastatin (CRESTOR) 5 MG tablet Take 1 tablet (5 mg total) by mouth every evening. For cholesterol. Office visit required for further refills. 90 tablet 0   No current facility-administered medications for this visit.    No Known Allergies  Review of Systems  Constitutional:  Negative for activity change, fatigue and unexpected weight change.  HENT:  Negative for trouble swallowing and voice change.   Respiratory:  Positive for apnea and shortness of breath.   Cardiovascular:  Negative for chest pain and leg swelling.  Gastrointestinal:  Positive for abdominal pain.  Genitourinary:  Negative for dysuria.  Musculoskeletal:  Negative for arthralgias and back pain.  Neurological:  Positive for syncope. Negative for weakness.  Hematological:  Negative for adenopathy. Does  not bruise/bleed easily.   BP 118/82 (BP Location: Right Arm, Patient Position: Sitting)    Pulse 68    Resp 20    Ht 6\' 1"  (1.854 m)    Wt (!) 305 lb (138.3 kg)    SpO2 97%    BMI 40.24 kg/m  Physical Exam Vitals reviewed.  Constitutional:      General: Antonio Horton is not in acute distress.    Appearance: Antonio Horton is obese.  HENT:     Head: Normocephalic and atraumatic.  Eyes:     General: No scleral icterus.    Extraocular Movements: Extraocular movements intact.  Neck:     Vascular: No carotid bruit.  Cardiovascular:     Rate  and Rhythm: Normal rate and regular rhythm.     Heart sounds: Normal heart sounds. No murmur heard.   No friction rub. No gallop.  Pulmonary:     Effort: Pulmonary effort is normal. No respiratory distress.     Breath sounds: Normal breath sounds. No wheezing or rales.  Abdominal:     General: There is no distension.     Palpations: Abdomen is soft.  Musculoskeletal:     Cervical back: Neck supple.  Lymphadenopathy:     Cervical: No cervical adenopathy.  Skin:    General: Skin is warm and dry.  Neurological:     General: No focal deficit present.     Mental Status: Antonio Horton is alert and oriented to person, place, and time.     Cranial Nerves: No cranial nerve deficit.     Motor: No weakness.     Diagnostic Tests: CT ANGIOGRAPHY CHEST, ABDOMEN AND PELVIS   TECHNIQUE: Non-contrast CT of the chest was initially obtained.   Multidetector CT imaging through the chest, abdomen and pelvis was performed using the standard protocol during bolus administration of intravenous contrast. Multiplanar reconstructed images and MIPs were obtained and reviewed to evaluate the vascular anatomy.   CONTRAST:  153mL OMNIPAQUE IOHEXOL 350 MG/ML SOLN   COMPARISON:  None.   FINDINGS: CTA CHEST FINDINGS   Cardiovascular: Normal cardiac size. No arterial dilatation or central embolus. Mild calcification in the distal right coronary artery with trace calcification in the left-sided arteries. 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   The remainder is normal in caliber and the great vessels are clear. There is no pericardial effusion.   Mediastinum/Nodes: There are small scattered thyroid nodules, the largest is 1.8 cm in the left lobe. There is no intrathoracic adenopathy, focal esophageal thickening, hiatal hernia or tracheobronchial filling defect.    Lungs/Pleura: There is no pleural effusion, thickening or pneumothorax. There is an asymmetrical elevated right hemidiaphragm consistent with eventration or paresis. There are faint ground-glass opacities in the lower lobes which are probably microatelectasis, less likely pneumonitis. The remaining lungs are clear.   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 No suspicious bone lesions.   Review of the MIP images confirms the above findings.   CTA ABDOMEN AND PELVIS FINDINGS   VASCULAR   Aorta: Normal caliber aorta without aneurysm, dissection, vasculitis or significant stenosis.   Celiac: Patent without evidence of aneurysm, dissection, vasculitis or significant stenosis.   SMA: Patent without evidence of aneurysm, dissection, vasculitis or significant stenosis.   Renals: Both renal arteries are patent without evidence of aneurysm, dissection, vasculitis, fibromuscular  dysplasia or significant stenosis.   IMA: Patent without evidence of aneurysm, dissection, vasculitis or significant stenosis.   Inflow: Patent without evidence of aneurysm, dissection, vasculitis or significant stenosis.   Veins: No obvious venous abnormality within the limitations of this arterial phase study.   Review of the MIP images confirms the above findings.   NON-VASCULAR   Hepatobiliary: Not well seen due to artifact from the patient's arms in the field. There are small stones in the gallbladder without wall thickening or biliary dilatation.   There is a 4.1 cm low-density lesion medially in the right hepatic lobe posterior segment which measures 25 Hounsfield units above the typical density of fluid but appears thin walled and uncomplicated.   The liver is mildly steatotic, without other obvious focal lesion. Appears to be a nodular surface contour in the left lobe which could suggest early cirrhosis.   Pancreas:  Unremarkable.   Spleen: No mass enhancement or splenomegaly.   Adrenals/Urinary Tract: There is no adrenal mass. No renal mass, calculus or hydronephrosis are seen.   Stomach/Bowel: No dilatation or wall thickening, including the appendix. Constipation. Left colonic diverticula without evidence of acute diverticulitis   Lymphatic: No enlarged abdominopelvic nodes.   Reproductive: Normal prostate.   Other: No free air, hemorrhage or fluid.   Musculoskeletal: Advanced degenerative change lumbar spine. Multilevel lumbar foraminal stenosis.   Review of the MIP images confirms the above findings.   IMPRESSION: 1. No aortic dissection. 2. 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 Normal abdominal aorta and branch arteries. 3. Faint haziness in the lower lobes is most likely microatelectasis, less likely pneumonitis. Elevated right diaphragm. 4. Small amount of calcification in the distal right coronary artery with trace calcification in the left-sided arteries. 5. Cholelithiasis without gallbladder thickening or biliary dilatation. 6. Question early changes of cirrhosis of liver. No splenomegaly or ascites. 7. 4.1 cm low-density lesion in the right hepatic lobe above the usual density of fluid. MRI recommended. 8. Constipation and diverticulosis. 9. 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 10. Thyroid nodules.  Routine ultrasound follow-up recommended.     Electronically Signed   By: Telford Nab M.D.   On: 11/24/2020 23:08   I personally reviewed the CT images.  There is a 5 to 5.2 cm ascending aneurysm.  Normal reviewed anatomy.  Bovine arch anatomy with common takeoff of innominate and left common carotid.  Returns to normal size after that takeoff.  Impression: Antonio Horton is a 55 year old man with a past medical history significant for morbid obesity, hypertension, hyperlipidemia, sleep apnea, type 2 diabetes without complication, and  vasovagal syncope.  Antonio Horton recently was admitted and found to have cholecystitis.  Antonio Horton underwent a cholecystectomy.  As part of his work-up Antonio Horton had a CT of the chest abdomen and pelvis.  That showed a 5.2 cm ascending aneurysm.  Ascending aneurysm-measures 5 to 5.2 cm in diameter.  I reviewed the images with him and his wife.  No indication for surgery at present time.  Guidelines recommend scanning every 6 months for aneurysms of the size.  Indications for surgery would include size of 5.5 to 6 cm or increase by 5 mm in 6 months.  Importance of blood pressure control was emphasized.  Discussed symptoms of aortic dissection with wound.  Antonio Horton knows to seek medical help immediately if Antonio Horton were to experience the symptoms.  Also advised him to avoid quinolone antibiotics.  Hypertension-blood pressure well controlled on lisinopril/hydrochlorothiazide  Hyperlipidemia-Antonio Horton is on  Crestor.  Plan: Return in 6 months with CT angio chest  Melrose Nakayama, MD [facility] [phone]

## 2021-01-02 LAB — CYTOLOGY - NON PAP

## 2021-01-04 DIAGNOSIS — E041 Nontoxic single thyroid nodule: Secondary | ICD-10-CM

## 2021-01-17 ENCOUNTER — Encounter (HOSPITAL_COMMUNITY): Payer: Self-pay

## 2021-01-19 ENCOUNTER — Other Ambulatory Visit: Payer: Self-pay | Admitting: Primary Care

## 2021-01-19 DIAGNOSIS — E7800 Pure hypercholesterolemia, unspecified: Secondary | ICD-10-CM

## 2021-01-24 NOTE — Telephone Encounter (Signed)
Pt wife called stating that pt hasn't heard anything from the referral. Please advise.

## 2021-03-05 ENCOUNTER — Ambulatory Visit (INDEPENDENT_AMBULATORY_CARE_PROVIDER_SITE_OTHER): Payer: 59 | Admitting: Endocrinology

## 2021-03-05 ENCOUNTER — Encounter: Payer: Self-pay | Admitting: Endocrinology

## 2021-03-05 ENCOUNTER — Other Ambulatory Visit: Payer: Self-pay

## 2021-03-05 VITALS — BP 140/90 | HR 94 | Ht 73.0 in | Wt 305.8 lb

## 2021-03-05 DIAGNOSIS — E042 Nontoxic multinodular goiter: Secondary | ICD-10-CM

## 2021-03-05 NOTE — Patient Instructions (Addendum)
No medication or other treatment is needed for the thyroid now. Please come back for a follow-up appointment in 8 months Our plan will be to recheck the ultrasound in the future.

## 2021-03-05 NOTE — Progress Notes (Signed)
Subjective:    Patient ID: Antonio Horton, male    DOB: 20-Mar-1965, 56 y.o.   MRN: [id number]  HPI Pt is referred by Alma Friendly, NP, for nodular thyroid.  Pt was noted to have a thyroid nodule in 2022.  He is unaware of ever having had thyroid problems in the past.  He has never been on thyroid medication.  He has no h/o XRT or surgery to the neck.  He has lost 100 lbs in 2021--intentional.   Past Medical History:  Diagnosis Date   Borderline diabetes    COVID-19 virus infection 06/14/2020   Diabetes mellitus without complication ([place])    Hyperlipidemia    Hypertension    Hypoxemia 11/25/2020   Sepsis ([place]) 11/25/2020   Sleep apnea    wears CPAP   Syncope 11/25/2020   Vasovagal reaction     Past Surgical History:  Procedure Laterality Date   CHOLECYSTECTOMY N/A 11/27/2020   Procedure: LAPAROSCOPIC CHOLECYSTECTOMY;  Surgeon: Georganna Skeans, MD;  Location: [facility];  Service: General;  Laterality: N/A;   EYE SURGERY     Corneal dystrophies    Social History   Socioeconomic History   Marital status: Married    Spouse name: Not on file   Number of children: Not on file   Years of education: Not on file   Highest education level: Not on file  Occupational History   Not on file  Tobacco Use   Smoking status: Never   Smokeless tobacco: Never  Vaping Use   Vaping Use: Never used  Substance and Sexual Activity   Alcohol use: Not Currently    Alcohol/week: 0.0 standard drinks    Comment: socially   Drug use: No   Sexual activity: Not on file  Other Topics Concern   Not on file  Social History Narrative   Married.   2 children.   Works as a Administrator, sports.   Enjoys reading.    Social Determinants of Health   Financial Resource Strain: Not on file  Food Insecurity: Not on file  Transportation Needs: Not on file  Physical Activity: Not on file  Stress: Not on file  Social Connections: Not on file  Intimate Partner Violence: Not on file    Current  Outpatient Medications on File Prior to Visit  Medication Sig Dispense Refill   lisinopril-hydrochlorothiazide (ZESTORETIC) 10-12.5 MG tablet Take 1 tablet by mouth daily. For blood pressure. 90 tablet 2   metFORMIN (GLUCOPHAGE-XR) 500 MG 24 hr tablet Take 1 tablet (500 mg total) by mouth daily with breakfast. For diabetes. 90 tablet 1   prednisoLONE acetate (PRED FORTE) 1 % ophthalmic suspension Place 1 drop into the left eye daily.      rosuvastatin (CRESTOR) 5 MG tablet Take 1 tablet (5 mg total) by mouth daily. For cholesterol. 90 tablet 0   No current facility-administered medications on file prior to visit.    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    Family History  Adopted: Yes  Problem Relation Age of Onset   Other Other        adopted   Thyroid disease Neg Hx     BP 140/90    Pulse 94    Ht 6\' 1"  (1.854 m)    Wt (!) 305 lb 12.8 oz (138.7 kg)    SpO2 95%    BMI 40.35 kg/m    Review of Systems Denies hoarseness, neck pain, and  sob.     Objective:   Physical Exam VITAL SIGNS:  See vs page GENERAL: no distress NECK: I thinks I can feel a left sided thyroid nodule, but I am uncertain.     Lab Results  Component Value Date   TSH 1.60 12/04/2020   [place] (2022): MNG Nodule in the LUL needs biopsy. Bx cytol: FLUS Afirma: low risk  I have reviewed outside records, and summarized: Pt was noted to have MNG, and referred here.  He had CT to eval chest pain, and MNG was incidentally noted.      Assessment & Plan:  MNG: uncertain etiology and prognosis  Patient Instructions  No medication or other treatment is needed for the thyroid now. Please come back for a follow-up appointment in 8 months Our plan will be to recheck the ultrasound in the future.

## 2021-03-13 ENCOUNTER — Encounter: Payer: Self-pay | Admitting: Primary Care

## 2021-03-13 ENCOUNTER — Other Ambulatory Visit: Payer: Self-pay

## 2021-03-13 ENCOUNTER — Ambulatory Visit (INDEPENDENT_AMBULATORY_CARE_PROVIDER_SITE_OTHER): Payer: 59 | Admitting: Primary Care

## 2021-03-13 VITALS — BP 110/78 | HR 78 | Temp 98.6°F | Ht 73.0 in | Wt 306.0 lb

## 2021-03-13 DIAGNOSIS — G4733 Obstructive sleep apnea (adult) (pediatric): Secondary | ICD-10-CM

## 2021-03-13 DIAGNOSIS — Z125 Encounter for screening for malignant neoplasm of prostate: Secondary | ICD-10-CM

## 2021-03-13 DIAGNOSIS — E1165 Type 2 diabetes mellitus with hyperglycemia: Secondary | ICD-10-CM

## 2021-03-13 DIAGNOSIS — E042 Nontoxic multinodular goiter: Secondary | ICD-10-CM

## 2021-03-13 DIAGNOSIS — E119 Type 2 diabetes mellitus without complications: Secondary | ICD-10-CM | POA: Diagnosis not present

## 2021-03-13 DIAGNOSIS — H18519 Endothelial corneal dystrophy, unspecified eye: Secondary | ICD-10-CM

## 2021-03-13 DIAGNOSIS — E7800 Pure hypercholesterolemia, unspecified: Secondary | ICD-10-CM | POA: Diagnosis not present

## 2021-03-13 DIAGNOSIS — I7121 Aneurysm of the ascending aorta, without rupture: Secondary | ICD-10-CM | POA: Diagnosis not present

## 2021-03-13 DIAGNOSIS — Z0000 Encounter for general adult medical examination without abnormal findings: Secondary | ICD-10-CM | POA: Diagnosis not present

## 2021-03-13 DIAGNOSIS — Z23 Encounter for immunization: Secondary | ICD-10-CM

## 2021-03-13 DIAGNOSIS — I10 Essential (primary) hypertension: Secondary | ICD-10-CM

## 2021-03-13 DIAGNOSIS — I2510 Atherosclerotic heart disease of native coronary artery without angina pectoris: Secondary | ICD-10-CM

## 2021-03-13 LAB — LIPID PANEL
Cholesterol: 138 mg/dL (ref 0–200)
HDL: 39.6 mg/dL (ref >39.00)
LDL Cholesterol: 73 mg/dL (ref 0–99)
NonHDL: 98.39
Total CHOL/HDL Ratio: 3
Triglycerides: 129 mg/dL (ref 0.0–149.0)
VLDL: 25.8 mg/dL (ref 0.0–40.0)

## 2021-03-13 LAB — PSA: PSA: 0.64 ng/mL (ref 0.10–4.00)

## 2021-03-13 LAB — HEMOGLOBIN A1C: Hgb A1c MFr Bld: 7.2 % — ABNORMAL HIGH (ref 4.6–6.5)

## 2021-03-13 MED ORDER — ROSUVASTATIN CALCIUM 5 MG PO TABS: 5.0000 mg | ORAL_TABLET | Freq: Every day | ORAL | 3 refills | Status: DC

## 2021-03-13 MED ORDER — METFORMIN HCL ER 500 MG PO TB24: 500.0000 mg | ORAL_TABLET | Freq: Every day | ORAL | 1 refills | Status: DC

## 2021-03-13 NOTE — Assessment & Plan Note (Signed)
Second Shingrix due, provided today. Other vaccines UTD. PSA due and pending. Colonoscopy UTD, due 2028.  Discussed the importance of a healthy diet and regular exercise in order for weight loss, and to reduce the risk of further co-morbidity.  Exam today stable Labs pending.

## 2021-03-13 NOTE — Assessment & Plan Note (Signed)
Evaluated by cardiothoracic surgery, plan is to monitor. Office notes reviewed from December 2022.   Due for repeat CTA in May 2023.

## 2021-03-13 NOTE — Assessment & Plan Note (Signed)
Repeat A1C pending  Continue Metformin XR 500 mg daily.  Managed on statin and ACE-I. Pneumonia vaccine UTD. Eye exam UTD.  Follow up in 6 months.

## 2021-03-13 NOTE — Assessment & Plan Note (Addendum)
Following with ophthalmology every 6 months.  Continue prednisolone drops.

## 2021-03-13 NOTE — Assessment & Plan Note (Signed)
Asymptomatic.  Continue BP, lipid, and diabetes control.

## 2021-03-13 NOTE — Progress Notes (Signed)
Subjective:    Patient ID: Antonio Horton, male    DOB: Oct 09, 1965, 56 y.o.   MRN: [id number]  HPI  Antonio Horton is a very pleasant 56 y.o. male who presents today for complete physical and follow up of chronic conditions.  Immunizations: -Tetanus: 2021 -Influenza: Declines -Covid-19: 2 vaccines  -Shingles: Completed 1 dose of Shingrix -Pneumonia: 2021  Diet: [place].  Exercise: No regular exercise.  Eye exam: Completes annually  Dental exam: Completed several years ago.   Colonoscopy: Completed in 2021 due 2028 PSA: Due  BP Readings from Last 3 Encounters:  03/13/21 110/78  03/05/21 140/90  01/01/21 118/82        Review of Systems  Constitutional:  Negative for unexpected weight change.  HENT:  Negative for rhinorrhea.   Respiratory:  Negative for cough and shortness of breath.   Cardiovascular:  Negative for chest pain.  Gastrointestinal:  Negative for constipation and diarrhea.  Genitourinary:  Negative for difficulty urinating.  Musculoskeletal:  Positive for arthralgias. Negative for myalgias.  Skin:  Negative for rash.  Allergic/Immunologic: Negative for environmental allergies.  Neurological:  Negative for dizziness and headaches.  Psychiatric/Behavioral:  The patient is not nervous/anxious.         Past Medical History:  Diagnosis Date   Borderline diabetes    COVID-19 virus infection 06/14/2020   Diabetes mellitus without complication ([place])    Hyperlipidemia    Hypertension    Hypoxemia 11/25/2020   Sepsis ([place]) 11/25/2020   Sleep apnea    wears CPAP   Syncope 11/25/2020   Vasovagal reaction     Social History   Socioeconomic History   Marital status: Married    Spouse name: Not on file   Number of children: Not on file   Years of education: Not on file   Highest education level: Not on file  Occupational History   Not on file  Tobacco Use   Smoking status: Never   Smokeless tobacco: Never  Vaping Use   Vaping Use:  Never used  Substance and Sexual Activity   Alcohol use: Not Currently    Alcohol/week: 0.0 standard drinks    Comment: socially   Drug use: No   Sexual activity: Not on file  Other Topics Concern   Not on file  Social History Narrative   Married.   2 children.   Works as a Administrator, sports.   Enjoys reading.    Social Determinants of Health   Financial Resource Strain: Not on file  Food Insecurity: Not on file  Transportation Needs: Not on file  Physical Activity: Not on file  Stress: Not on file  Social Connections: Not on file  Intimate Partner Violence: Not on file    Past Surgical History:  Procedure Laterality Date   CHOLECYSTECTOMY N/A 11/27/2020   Procedure: LAPAROSCOPIC CHOLECYSTECTOMY;  Surgeon: Georganna Skeans, MD;  Location: [facility];  Service: General;  Laterality: N/A;   EYE SURGERY     Corneal dystrophies    Family History  Adopted: Yes  Problem Relation Age of Onset   Other Other        adopted   Thyroid disease Neg Hx     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    Current Outpatient Medications on File Prior to Visit  Medication Sig Dispense Refill   lisinopril-hydrochlorothiazide (ZESTORETIC) 10-12.5 MG tablet Take 1 tablet by mouth daily. For blood pressure. 90 tablet 2  metFORMIN (GLUCOPHAGE-XR) 500 MG 24 hr tablet Take 1 tablet (500 mg total) by mouth daily with breakfast. For diabetes. 90 tablet 1   prednisoLONE acetate (PRED FORTE) 1 % ophthalmic suspension Place 1 drop into the left eye daily.      rosuvastatin (CRESTOR) 5 MG tablet Take 1 tablet (5 mg total) by mouth daily. For cholesterol. 90 tablet 0   No current facility-administered medications on file prior to visit.    BP 110/78    Pulse 78    Temp 98.6 F (37 C) (Temporal)    Ht 6\' 1"  (1.854 m)    Wt (!) 306 lb (138.8 kg)    SpO2 98%    BMI 40.37 kg/m  Objective:   Physical Exam HENT:     Right Ear: Tympanic membrane and ear canal normal.      Left Ear: Tympanic membrane and ear canal normal.     Nose: Nose normal.     Right Sinus: No maxillary sinus tenderness or frontal sinus tenderness.     Left Sinus: No maxillary sinus tenderness or frontal sinus tenderness.  Eyes:     Conjunctiva/sclera: Conjunctivae normal.  Neck:     Thyroid: No thyromegaly.     Vascular: No carotid bruit.  Cardiovascular:     Rate and Rhythm: Normal rate and regular rhythm.     Heart sounds: Normal heart sounds.  Pulmonary:     Effort: Pulmonary effort is normal.     Breath sounds: Normal breath sounds. No wheezing or rales.  Abdominal:     General: Bowel sounds are normal.     Palpations: Abdomen is soft.     Tenderness: There is no abdominal tenderness.  Musculoskeletal:        General: Normal range of motion.     Cervical back: Neck supple.  Skin:    General: Skin is warm and dry.  Neurological:     Mental Status: He is alert and oriented to person, place, and time.     Cranial Nerves: No cranial nerve deficit.     Deep Tendon Reflexes: Reflexes are normal and symmetric.  Psychiatric:        Mood and Affect: Mood normal.          Assessment & Plan:      This visit occurred during the SARS-CoV-2 public health emergency.  Safety protocols were in place, including screening questions prior to the visit, additional usage of staff PPE, and extensive cleaning of exam room while observing appropriate contact time as indicated for disinfecting solutions.

## 2021-03-13 NOTE — Addendum Note (Signed)
Addended by: Francella Solian on: 03/13/2021 10:34 AM   Modules accepted: Orders

## 2021-03-13 NOTE — Assessment & Plan Note (Signed)
Compliant to CPAP nightly, continue same.  

## 2021-03-13 NOTE — Assessment & Plan Note (Signed)
Continue rosuvastatin 5 mg daily. Repeat lipid panel pending 

## 2021-03-13 NOTE — Patient Instructions (Signed)
Stop by the lab prior to leaving today. I will notify you of your results once received.   It was a pleasure to see you today!  Preventive Care 8-55 Years Old, Male Preventive care refers to lifestyle choices and visits with your health care provider that can promote health and wellness. Preventive care visits are also called wellness exams. What can I expect for my preventive care visit? Counseling During your preventive care visit, your health care provider may ask about your: Medical history, including: Past medical problems. Family medical history. Current health, including: Emotional well-being. Home life and relationship well-being. Sexual activity. Lifestyle, including: Alcohol, nicotine or tobacco, and drug use. Access to firearms. Diet, exercise, and sleep habits. Safety issues such as seatbelt and bike helmet use. Sunscreen use. Work and work Statistician. Physical exam Your health care provider will check your: Height and weight. These may be used to calculate your BMI (body mass index). BMI is a measurement that tells if you are at a healthy weight. Waist circumference. This measures the distance around your waistline. This measurement also tells if you are at a healthy weight and may help predict your risk of certain diseases, such as type 2 diabetes and high blood pressure. Heart rate and blood pressure. Body temperature. Skin for abnormal spots. What immunizations do I need? Vaccines are usually given at various ages, according to a schedule. Your health care provider will recommend vaccines for you based on your age, medical history, and lifestyle or other factors, such as travel or where you work. What tests do I need? Screening Your health care provider may recommend screening tests for certain conditions. This may include: Lipid and cholesterol levels. Diabetes screening. This is done by checking your blood sugar (glucose) after you have not eaten for a while  (fasting). Hepatitis B test. Hepatitis C test. HIV (human immunodeficiency virus) test. STI (sexually transmitted infection) testing, if you are at risk. Lung cancer screening. Prostate cancer screening. Colorectal cancer screening. Talk with your health care provider about your test results, treatment options, and if necessary, the need for more tests. Follow these instructions at home: Eating and drinking  Eat a diet that includes fresh fruits and vegetables, whole grains, lean protein, and low-fat dairy products. Take vitamin and mineral supplements as recommended by your health care provider. Do not drink alcohol if your health care provider tells you not to drink. If you drink alcohol: Limit how much you have to 0-2 drinks a day. Know how much alcohol is in your drink. In the [place] one drink equals one 12 oz bottle of beer (355 mL), one 5 oz glass of wine (148 mL), or one 1 oz glass of hard liquor (44 mL). Lifestyle Brush your teeth every morning and night with fluoride toothpaste. Floss one time each day. Exercise for at least 30 minutes 5 or more days each week. Do not use any products that contain nicotine or tobacco. These products include cigarettes, chewing tobacco, and vaping devices, such as e-cigarettes. If you need help quitting, ask your health care provider. Do not use drugs. If you are sexually active, practice safe sex. Use a condom or other form of protection to prevent STIs. Take aspirin only as told by your health care provider. Make sure that you understand how much to take and what form to take. Work with your health care provider to find out whether it is safe and beneficial for you to take aspirin daily. Find healthy ways to manage stress,  such as: Meditation, yoga, or listening to music. Journaling. Talking to a trusted person. Spending time with friends and family. Minimize exposure to UV radiation to reduce your risk of skin cancer. Safety Always wear  your seat belt while driving or riding in a vehicle. Do not drive: If you have been drinking alcohol. Do not ride with someone who has been drinking. When you are tired or distracted. While texting. If you have been using any mind-altering substances or drugs. Wear a helmet and other protective equipment during sports activities. If you have firearms in your house, make sure you follow all gun safety procedures. What's next? Go to your health care provider once a year for an annual wellness visit. Ask your health care provider how often you should have your eyes and teeth checked. Stay up to date on all vaccines. This information is not intended to replace advice given to you by your health care provider. Make sure you discuss any questions you have with your health care provider. Document Revised: 07/04/2020 Document Reviewed: 07/04/2020 Elsevier Patient Education  [place].

## 2021-03-13 NOTE — Assessment & Plan Note (Signed)
Completed thyroid ultrasound and thyroid biopsy.  Evaluated by endocrinology, office notes reviewed from February 2023. No treatment recommended.   Due for repeat thyroid ultrasound in November 2023.

## 2021-03-13 NOTE — Assessment & Plan Note (Signed)
Controlled.  Continue lisinopril-HCTZ 10-12.5 mg daily.  CMP reviewed from November 2022.

## 2021-03-14 ENCOUNTER — Encounter: Payer: Self-pay | Admitting: Nurse Practitioner

## 2021-03-24 NOTE — Progress Notes (Signed)
03/24/2021 Antonio Horton [id number] April 26, 1965   CHIEF COMPLAINT: fatty liver, possible cirrhosis   HISTORY OF PRESENT ILLNESS:  Antonio Horton is a 56 year old male with a past medical history of obesity, DM II, hypertension, hyperlipidemia, ascending aortic aneurysm, thyroid nodules, sleep apnea uses cpap and colon polyps. He presents to our office today as referred by Alma Friendly NP for further evaluation regarding hepatic steatosis and possible cirrhosis.   He was admitted to the hospital 11/24/2020 due to having N/V, abdominal pain and syncope. An abdominal MRI showed cholelithiasis in a distended gallbladder without acute cholecystitis and he subsequently underwent a laparoscopic cholecystectomy by Dr. Georganna Skeans  on 11/27/2020. The abdominal MRI during this hospitalization also showed mild diffuse hepatic steatosis with slight nodularity of the hepatic contour possibly reflecting cirrhosis and a 3.8 cm hepatic cyst was identified. A chest CTA showed  a 5.2 ascending aortic aneurysm, bilateral thyroid nodules and a small amount of calcification in the distal right coronary artery and trace calcifications in left-sided arteries.  His postoperative course was uncomplicated and he was discharged home on 11/27/2020 with instructions to follow-up with vascular surgery regarding his ascending aortic aneurysm, to follow-up with his endocrinologist for thyroid biopsy and to schedule GI evaluation for further evaluation regarding hepatic steatosis and possible cirrhosis.  He denies having any nausea or vomiting.  No upper or lower abdominal pain.  He is passing a normal formed brown bowel movement daily.  No rectal bleeding or black stools.  No GERD symptoms.  No alcohol use.  No drug use.  He intentionally lost 100 pounds over the past 2 years. He reported his Hg A1c level went down from 12 to 7.2%.  However, his weight has plateaued and he is unable to lose any further weight.  He  weighs 301 lbs today.   He was seen by vascular surgeon Dr. Modesto Charon 01/01/2021 for further evaluation regarding his a sending aortic aneurysm, no indication for surgery at this time, Dr. Roxan Hockey advised a repeat CT in 6 months and he emphasized the importance of blood pressure control.  He underwent thyroid ultrasound 12/18/2020 which showed a multinodular thyroid.  He underwent a thyroid biopsy 01/01/2021 which showed follicular lesion of undetermined significance.   He has a history of colon polyps.  He underwent colonoscopy by Dr. Havery Moros 12/14/2019 which identified a single nonbleeding colonic AVM and 2 tubular adenomatous polyps which were removed from the ascending and sigmoid colon.  He was advised to repeat a colonoscopy in 7 years.  CBC Latest Ref Rng & Units 12/04/2020 11/27/2020 11/26/2020  WBC 4.0 - 10.5 K/uL 8.4 9.4 10.3  Hemoglobin 13.0 - 17.0 g/dL 15.6 13.3 14.1  Hematocrit 39.0 - 52.0 % 46.8 39.5 41.2  Platelets 150.0 - 400.0 K/uL 303.0 169 183    CMP Latest Ref Rng & Units 12/04/2020 11/27/2020 11/26/2020  Glucose 70 - 99 mg/dL 129(H) 140(H) 137(H)  BUN 6 - 23 mg/dL '13 9 10  '$ Creatinine 0.40 - 1.50 mg/dL 0.98 1.00 1.05  Sodium 135 - 145 mEq/L 136 136 138  Potassium 3.5 - 5.1 mEq/L 4.8 3.4(L) 3.3(L)  Chloride 96 - 112 mEq/L 98 103 106  CO2 19 - 32 mEq/L '30 26 27  '$ Calcium 8.4 - 10.5 mg/dL 9.7 8.4(L) 8.4(L)  Total Protein 6.0 - 8.3 g/dL 7.2 5.2(L) -  Total Bilirubin 0.2 - 1.2 mg/dL 0.8 0.8 -  Alkaline Phos 39 - 117 U/L 55 38 -  AST 0 -  37 U/L 18 12(L) -  ALT 0 - 53 U/L 21 11 -    Chest CTA 11/24/2020: 1. No aortic dissection. 2. 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 Normal abdominal aorta and branch arteries. 3. Faint haziness in the lower lobes is most likely microatelectasis, less likely pneumonitis. Elevated right diaphragm. 4. Small amount of calcification in the distal right coronary artery with trace calcification in the  left-sided arteries. 5. Cholelithiasis without gallbladder thickening or biliary dilatation. 6. Question early changes of cirrhosis of liver. No splenomegaly or ascites. 7. 4.1 cm low-density lesion in the right hepatic lobe above the usual density of fluid. MRI recommended. 8. Constipation and diverticulosis. 9. 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 10. Thyroid nodules.  Routine ultrasound follow-up recommended.  RUQ sonogram 11/24/2020: Evaluation is very limited due to body habitus and overlying bowel gas.   Gallbladder: There is sludge and stone within the gallbladder. There is no gallbladder wall thickening or pericholecystic fluid. Negative sonographic Murphy's sign.   Common bile duct: Diameter: 5 mm   Liver: The liver demonstrates a heterogeneous echotexture. Ill-defined echogenic area in the right lobe of the liver measuring approximately 10 x 6 x 7 cm is not evaluated and poorly visualized. No corresponding mass noted on the CT. Portal vein is patent on color Doppler imaging with normal direction of blood flow towards the liver.   IMPRESSION: 1. Cholelithiasis without sonographic evidence of acute cholecystitis. 2. Heterogeneous liver.  Abdominal MRI 11/25/2020: Lower chest: Heterogeneous signal in the dependent lungs favored atelectasis.   Hepatobiliary: Mild diffuse hepatic steatosis. Slight nodularity of the hepatic contour possibly reflecting changes of cirrhosis. Well-circumscribed T2 hyperintense nonenhancing 3.8 cm hepatic cyst on image 28/3 which corresponds with the lesion seen on prior CT and ultrasound. No suspicious hepatic lesion.   Cholelithiasis in a distended gallbladder without pericholecystic fluid. No biliary ductal dilation. The common bile duct measures 4 mm. No choledocholithiasis.   Pancreas: Intrinsic T1 signal of the pancreatic parenchyma is within normal limits. No pancreatic ductal dilation. No suspicious pancreatic mass  visualized.   Spleen:  Within normal limits.   Adrenals/Urinary Tract: Bilateral adrenal glands are unremarkable. No hydronephrosis. No solid enhancing renal mass.   Stomach/Bowel: No evidence of acute bowel inflammation or obstruction. Colonic diverticulosis without findings of acute diverticulitis.   Vascular/Lymphatic: No abdominal aortic aneurysm. The portal, splenic and superior mesenteric veins are patent. Prominent periportal lymph nodes measuring up to 8 mm.   Other:  No abdominal ascites.   Musculoskeletal: No suspicious bone lesions identified.   IMPRESSION: 1. Mild diffuse hepatic steatosis with slight nodularity of the hepatic contour possibly reflecting changes of cirrhosis. No ascites or splenomegaly. 2. Well-circumscribed nonenhancing 3.8 cm hepatic cyst which corresponds with the lesion seen on prior CT. No suspicious hepatic lesion. 3. Cholelithiasis in a distended gallbladder without pericholecystic fluid. No biliary ductal dilation or choledocholithiasis.   PAST GI PROCEDURES:   Colonoscopy 12/14/2019 by Dr. Havery Moros: - A single colonic angiodysplastic lesion. - One diminutive polyp in the ascending colon, removed with a cold biopsy forceps. Resected and retrieved. - One 3 mm polyp in the sigmoid colon, removed with a cold biopsy forceps. Resected and retrieved. - Internal hemorrhoids. - The examination was otherwise normal. - 7 year colonoscopy recall - TUBULAR ADENOMA. - NO HIGH-GRADE DYSPLASIA OR CARCINOMA.  Past Medical History:  Diagnosis Date   Borderline diabetes    Cholelithiases 11/25/2020   COVID-19 virus infection 06/14/2020   Diabetes  mellitus without complication ([place])    Hyperlipidemia    Hypertension    Hypoxemia 11/25/2020   Sepsis ([place]) 11/25/2020   Sleep apnea    wears CPAP   Syncope 11/25/2020   Vasovagal reaction    Past Surgical History:  Procedure Laterality Date   CHOLECYSTECTOMY N/A 11/27/2020   Procedure:  LAPAROSCOPIC CHOLECYSTECTOMY;  Surgeon: Georganna Skeans, MD;  Location: [facility];  Service: General;  Laterality: N/A;   EYE SURGERY     Corneal dystrophies   Social History: He is married.  He has 2 daughters.  Nonsmoker. No alcohol use. No drug use.   Family History: Family history unknown as he was adopted  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      Outpatient Encounter Medications as of 03/25/2021  Medication Sig   lisinopril-hydrochlorothiazide (ZESTORETIC) 10-12.5 MG tablet Take 1 tablet by mouth daily. For blood pressure.   metFORMIN (GLUCOPHAGE-XR) 500 MG 24 hr tablet Take 1 tablet (500 mg total) by mouth daily with breakfast. For diabetes.   prednisoLONE acetate (PRED FORTE) 1 % ophthalmic suspension Place 1 drop into the left eye daily.    rosuvastatin (CRESTOR) 5 MG tablet Take 1 tablet (5 mg total) by mouth daily. For cholesterol.   No facility-administered encounter medications on file as of 03/25/2021.    REVIEW OF SYSTEMS: Gen: Denies fever, sweats or chills. No weight loss.  CV: Denies chest pain, palpitations or edema. Resp: Denies cough, shortness of breath of hemoptysis.  GI: Denies heartburn, dysphagia, stomach or lower abdominal pain. No diarrhea or constipation.  GU : Denies urinary burning, blood in urine, increased urinary frequency or incontinence. MS: Denies joint pain, muscles aches or weakness. Derm: Denies rash, itchiness, skin lesions or unhealing ulcers. Psych: Denies depression, anxiety or memory loss.  Heme: Denies bruising, easy bleeding. Neuro:  Denies headaches, dizziness or paresthesias. Endo:  + DM II.   PHYSICAL EXAM: BP 124/72    Pulse 85    Ht '6\' 1"'$  (1.854 m)    Wt (!) 301 lb (136.5 kg)    BMI 39.71 kg/m   General: Obese 56 year old male in no acute distress. Head: Normocephalic and atraumatic. Eyes:  Sclerae non-icteric, conjunctive pink. Ears: Normal auditory acuity. Mouth: Dentition intact. No ulcers or  lesions.  Neck: Supple, no lymphadenopathy or thyromegaly.  Lungs: Clear bilaterally to auscultation without wheezes, crackles or rhonchi. Heart: Regular rate and rhythm. No murmur, rub or gallop appreciated.  Abdomen: Soft, nontender, non distended. No masses. No hepatosplenomegaly. Normoactive bowel sounds x 4 quadrants.  Rectal: Deferred Musculoskeletal: Symmetrical with no gross deformities. Skin: Warm and dry. No rash or lesions on visible extremities. Extremities: No edema. Neurological: Alert oriented x 4, no focal deficits.  Psychological:  Alert and cooperative. Normal mood and affect.  ASSESSMENT AND PLAN:  16) 56 year old male with hepatic steatosis, possible cirrhosis without splenomegaly per abdominal MRI 11/25/2020. Normal LFTs. Normal Plt and albumin levels which does not support the diagnosis of cirrhosis.  I explained to Mr. Waln that he has hepatic steatosis and he is at increased risk of developing cirrhosis.  I recommended scheduling an abdominal sonogram with elastography to further assess for fibrosis/cirrhosis. -RUQ sonogram with elastography -Continue low-carb diet, exercise as tolerated and weight loss -Refer to [facility] -Hepatic panel, Hepatitis A total antibody, hepatitis B surface antigen, hepatitis B core total antibody, hepatitis B surface antibody and hepatitis C antibody -If his  abdominal sonogram with elastography indicates cirrhosis he will require further hepatology laboratory studies with surveillance abdominal sono and AFP every 6 months -Follow-up with Dr. Havery Moros in 3 months  2) History of 2 small tubular adenomatous colon polyps per colonoscopy 11/2019 -Next colonoscopy due 11/2026    CC:  Pleas Koch, NP

## 2021-03-25 ENCOUNTER — Encounter: Payer: Self-pay | Admitting: Nurse Practitioner

## 2021-03-25 ENCOUNTER — Other Ambulatory Visit (INDEPENDENT_AMBULATORY_CARE_PROVIDER_SITE_OTHER): Payer: 59

## 2021-03-25 ENCOUNTER — Ambulatory Visit (INDEPENDENT_AMBULATORY_CARE_PROVIDER_SITE_OTHER): Payer: 59 | Admitting: Nurse Practitioner

## 2021-03-25 VITALS — BP 124/72 | HR 85 | Ht 73.0 in | Wt 301.0 lb

## 2021-03-25 DIAGNOSIS — K760 Fatty (change of) liver, not elsewhere classified: Secondary | ICD-10-CM

## 2021-03-25 DIAGNOSIS — E1165 Type 2 diabetes mellitus with hyperglycemia: Secondary | ICD-10-CM | POA: Diagnosis not present

## 2021-03-25 LAB — HEPATIC FUNCTION PANEL
ALT: 23 U/L (ref 0–53)
AST: 17 U/L (ref 0–37)
Albumin: 4.3 g/dL (ref 3.5–5.2)
Alkaline Phosphatase: 45 U/L (ref 39–117)
Bilirubin, Direct: 0.2 mg/dL (ref 0.0–0.3)
Total Bilirubin: 0.6 mg/dL (ref 0.2–1.2)
Total Protein: 7.6 g/dL (ref 6.0–8.3)

## 2021-03-25 NOTE — Patient Instructions (Addendum)
You will be contacted by [facility] (Your caller ID will indicate phone # [phone]) in the next 7 days to schedule your Ultrasound. If you have not heard from them within 7 business days, please call [facility] at [phone] to follow up on the status of your appointment.   ? ?Please proceed to the basement level for lab work before leaving today. Press "B" on the elevator. The lab is located at the first door on the left as you exit the elevator. ? ?HEALTHCARE LAWS AND MY CHART RESULTS:  ? ?Due to recent changes in healthcare laws, you may see results of your imaging and/or laboratory studies on MyChart before I have had a chance to review them.  I understand that in some cases there may be results that are confusing or concerning to you. Please understand that not all results are received at the same time and often I may need to interpret multiple results in order to provide you with the best plan of care or course of treatment. Therefore, I ask that you please give me 48 hours to thoroughly review all your results before contacting my office for clarification.  ? ?We have referred you to [facility]. You will be contacted by them for an appointment. ? ?Thank you for trusting me with your gastrointestinal care!   ? ?Noralyn Pick, CRNP ? ? ? ?BMI: ? ?If you are age 85 or older, your body mass index should be between 23-30. Your Body mass index is 39.71 kg/m?Marland Kitchen If this is out of the aforementioned range listed, please consider follow up with your Primary Care Provider. ? ?If you are age 14 or younger, your body mass index should be between 19-25. Your Body mass index is 39.71 kg/m?Marland Kitchen If this is out of the aformentioned range listed, please consider follow up with your Primary Care Provider.  ? ?MY CHART: ? ?The [facility] providers would like to encourage you to use [facility] to communicate with providers for non-urgent requests or questions.   Due to long hold times on the telephone, sending your provider a message by [facility] may be a faster and more efficient way to get a response.  Please allow 48 business hours for a response.  Please remember that this is for non-urgent requests.  ? ?

## 2021-03-26 LAB — HEPATITIS B CORE ANTIBODY, TOTAL: Hep B Core Total Ab: NONREACTIVE

## 2021-03-26 LAB — HEPATITIS C ANTIBODY
Hepatitis C Ab: NONREACTIVE
SIGNAL TO CUT-OFF: 0.52 (ref <1.00)

## 2021-03-26 LAB — HEPATITIS B SURFACE ANTIBODY,QUALITATIVE: Hep B S Ab: NONREACTIVE

## 2021-03-26 LAB — HEPATITIS B SURFACE ANTIGEN: Hepatitis B Surface Ag: NONREACTIVE

## 2021-03-26 LAB — HEPATITIS A ANTIBODY, TOTAL: Hepatitis A AB,Total: NONREACTIVE

## 2021-03-26 NOTE — Progress Notes (Signed)
Agree with assessment and plan as outlined.  

## 2021-04-02 ENCOUNTER — Other Ambulatory Visit: Payer: Self-pay

## 2021-04-02 ENCOUNTER — Ambulatory Visit (HOSPITAL_COMMUNITY)
Admission: RE | Admit: 2021-04-02 | Discharge: 2021-04-02 | Disposition: A | Discharge status: Home / Self Care | Payer: 59 | Source: Ambulatory Visit | Attending: Nurse Practitioner | Admitting: Nurse Practitioner

## 2021-04-02 DIAGNOSIS — E1165 Type 2 diabetes mellitus with hyperglycemia: Secondary | ICD-10-CM

## 2021-04-02 DIAGNOSIS — K760 Fatty (change of) liver, not elsewhere classified: Secondary | ICD-10-CM

## 2021-04-02 DIAGNOSIS — Z23 Encounter for immunization: Secondary | ICD-10-CM

## 2021-04-03 ENCOUNTER — Ambulatory Visit (INDEPENDENT_AMBULATORY_CARE_PROVIDER_SITE_OTHER): Payer: 59 | Admitting: Gastroenterology

## 2021-04-03 DIAGNOSIS — Z23 Encounter for immunization: Secondary | ICD-10-CM | POA: Diagnosis not present

## 2021-04-11 ENCOUNTER — Other Ambulatory Visit: Payer: Self-pay

## 2021-04-11 DIAGNOSIS — K760 Fatty (change of) liver, not elsewhere classified: Secondary | ICD-10-CM

## 2021-04-11 NOTE — Addendum Note (Signed)
Addended by: Hardie Pulley, Cade Olberding J on: 04/11/2021 10:26 AM ? ? Modules accepted: Orders ? ?

## 2021-04-12 ENCOUNTER — Other Ambulatory Visit (INDEPENDENT_AMBULATORY_CARE_PROVIDER_SITE_OTHER): Payer: 59

## 2021-04-12 DIAGNOSIS — K760 Fatty (change of) liver, not elsewhere classified: Secondary | ICD-10-CM | POA: Diagnosis not present

## 2021-04-12 LAB — IRON: Iron: 94 ug/dL (ref 42–165)

## 2021-04-12 LAB — CBC
HCT: 45.7 % (ref 39.0–52.0)
Hemoglobin: 15.3 g/dL (ref 13.0–17.0)
MCHC: 33.5 g/dL (ref 30.0–36.0)
MCV: 89.7 fl (ref 78.0–100.0)
Platelets: 197 10*3/uL (ref 150.0–400.0)
RBC: 5.1 Mil/uL (ref 4.22–5.81)
RDW: 13.8 % (ref 11.5–15.5)
WBC: 5.8 10*3/uL (ref 4.0–10.5)

## 2021-04-12 LAB — PROTIME-INR
INR: 1.2 ratio — ABNORMAL HIGH (ref 0.8–1.0)
Prothrombin Time: 13.1 s (ref 9.6–13.1)

## 2021-04-16 LAB — FERRITIN: Ferritin: 278.7 ng/mL (ref 22.0–322.0)

## 2021-04-17 LAB — ANTI-SMOOTH MUSCLE ANTIBODY, IGG: Actin (Smooth Muscle) Antibody (IGG): <20 U (ref <20)

## 2021-04-17 LAB — IGG: IgG (Immunoglobin G), Serum: 1302 mg/dL (ref 600–1640)

## 2021-04-17 LAB — CERULOPLASMIN: Ceruloplasmin: 22 mg/dL (ref 18–36)

## 2021-04-17 LAB — ANA: Anti Nuclear Antibody (ANA): NEGATIVE

## 2021-04-17 LAB — MITOCHONDRIAL ANTIBODIES: Mitochondrial M2 Ab, IgG: <=20 U (ref <=20.0)

## 2021-04-17 LAB — ALPHA-1-ANTITRYPSIN: A-1 Antitrypsin, Ser: 131 mg/dL (ref 83–199)

## 2021-04-17 LAB — AFP TUMOR MARKER: AFP-Tumor Marker: 4.1 ng/mL (ref <6.1)

## 2021-05-03 ENCOUNTER — Other Ambulatory Visit: Payer: Self-pay

## 2021-05-03 DIAGNOSIS — K760 Fatty (change of) liver, not elsewhere classified: Secondary | ICD-10-CM

## 2021-05-03 NOTE — Progress Notes (Signed)
Order placed and reminder set. ?

## 2021-05-06 ENCOUNTER — Ambulatory Visit (INDEPENDENT_AMBULATORY_CARE_PROVIDER_SITE_OTHER): Payer: 59 | Admitting: Gastroenterology

## 2021-05-06 DIAGNOSIS — Z23 Encounter for immunization: Secondary | ICD-10-CM

## 2021-05-13 LAB — HM DIABETES EYE EXAM

## 2021-05-29 ENCOUNTER — Other Ambulatory Visit: Payer: Self-pay | Admitting: Thoracic Surgery (Cardiothoracic Vascular Surgery)

## 2021-05-29 ENCOUNTER — Encounter: Payer: Self-pay | Admitting: Gastroenterology

## 2021-05-29 ENCOUNTER — Ambulatory Visit (INDEPENDENT_AMBULATORY_CARE_PROVIDER_SITE_OTHER): Payer: 59 | Admitting: Gastroenterology

## 2021-05-29 VITALS — BP 116/70 | HR 78 | Ht 73.0 in | Wt 311.4 lb

## 2021-05-29 DIAGNOSIS — K760 Fatty (change of) liver, not elsewhere classified: Secondary | ICD-10-CM | POA: Diagnosis not present

## 2021-05-29 DIAGNOSIS — I7121 Aneurysm of the ascending aorta, without rupture: Secondary | ICD-10-CM

## 2021-05-29 DIAGNOSIS — R932 Abnormal findings on diagnostic imaging of liver and biliary tract: Secondary | ICD-10-CM

## 2021-05-29 NOTE — Patient Instructions (Addendum)
If you are age 55 or older, your body mass index should be between 23-30. Your Body mass index is 41.08 kg/m?Marland Kitchen If this is out of the aforementioned range listed, please consider follow up with your Primary Care Provider. ? ?If you are age 67 or younger, your body mass index should be between 19-25. Your Body mass index is 41.08 kg/m?Marland Kitchen If this is out of the aformentioned range listed, please consider follow up with your Primary Care Provider.  ? ?________________________________________________________ ? ?The [facility] providers would like to encourage you to use [facility] to communicate with providers for non-urgent requests or questions.  Due to long hold times on the telephone, sending your provider a message by [facility] may be a faster and more efficient way to get a response.  Please allow 48 business hours for a response.  Please remember that this is for non-urgent requests.  ?_______________________________________________________ ? ?We are referring you to Dr. Johnette Abraham at [facility].  They will contact you directly to schedule an appointment.  It may take a week or more before you hear from them.  Please feel free to contact us if you have not heard from them within 2 weeks and we will follow up on the referral.  ? ?You will be due for RUQ ultrasound and labs in September.  We will contact you as times gets closer to schedule. ? ?Thank you for entrusting me with your care and for choosing [employer], ?Dr. Waukee Cellar ? ? ? ? ? ? ? ?

## 2021-05-29 NOTE — Progress Notes (Signed)
? ?HPI :  ?56 year old male here for follow-up visit for possible cirrhosis of the liver.  Seen in clinic recently by Carl Best in March.  I know him from colonoscopy last November. ? ?Recall he has a history of obesity, DM II, hypertension, hyperlipidemia, ascending aortic aneurysm, thyroid nodules, sleep apnea uses cpap and colon polyps.  ? ?Recall he was admitted to the hospital 11/24/2020 due to having N/V, abdominal pain and syncope. An abdominal MRI showed cholelithiasis in a distended gallbladder without acute cholecystitis and he subsequently underwent a laparoscopic cholecystectomy by Dr. Georganna Skeans  on 11/27/2020. The abdominal MRI during this hospitalization also showed mild diffuse hepatic steatosis with slight nodularity of the hepatic contour possibly reflecting cirrhosis and a 3.8 cm hepatic cyst was identified. A chest CTA showed  a 5.2 ascending aortic aneurysm, bilateral thyroid nodules and a small amount of calcification in the distal right coronary artery and trace calcifications in left-sided arteries.   ?   ?He was seen by vascular surgeon Dr. Modesto Charon 01/01/2021 for further evaluation regarding his a sending aortic aneurysm, no indication for surgery at this time, Dr. Roxan Hockey advised a repeat CT in 6 months and he emphasized the importance of blood pressure control. ?  ?He underwent thyroid ultrasound 12/18/2020 which showed a multinodular thyroid.  He underwent a thyroid biopsy 01/01/2021 which showed follicular lesion of undetermined significance.  ?  ?At his last visit he underwent a serologic work-up for chronic liver diseases and an ultrasound with elastography.  Serologic work-up unremarkable.  Elastography showed a medium K PA of 9.5 suggestive of advanced liver disease but "needs further testing".  INR 1.2, platelets normal.  LFTs normal, he was nonimmune to hepatitis a and B. ? ? ?He denies any frequent alcohol use, drinks at most 3 alcoholic beverages  per year.  Denies any history of chronic steroid use other than drops for his corneal transplant.  He denies any history of jaundice, ascites, encephalopathy, or GI bleeding.  He has not had any reflux symptoms of bother him in regards to needs for EGD. ? ?He has struggled to lose weight, BMI remains at 41.  He was referred to the [facility] but states there is a significant weight there, he is looking for other options. ? ? ? ? ?Chest CTA 11/24/2020: ?1. No aortic dissection. ?2. 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 ?Normal abdominal aorta and branch arteries. ?3. Faint haziness in the lower lobes is most likely ?microatelectasis, less likely pneumonitis. Elevated right diaphragm. ?4. Small amount of calcification in the distal right coronary artery ?with trace calcification in the left-sided arteries. ?5. Cholelithiasis without gallbladder thickening or biliary ?dilatation. ?6. Question early changes of cirrhosis of liver. No splenomegaly or ?ascites. ?7. 4.1 cm low-density lesion in the right hepatic lobe above the ?usual density of fluid. MRI recommended. ?8. Constipation and diverticulosis. ?9. 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 ?10. Thyroid nodules.  Routine ultrasound follow-up recommended. ?  ?RUQ sonogram 11/24/2020: ?Evaluation is very limited due to body habitus and overlying bowel ?gas. ?  ?Gallbladder: ?There is sludge and stone within the gallbladder. There is no ?gallbladder wall thickening or pericholecystic fluid. Negative ?sonographic Murphy's sign. ?  ?Common bile duct: Diameter: 5 mm ?  ?Liver: The liver demonstrates a heterogeneous echotexture. Ill-defined ?echogenic area in the right lobe of the liver measuring ?approximately 10 x 6 x 7 cm is not evaluated and poorly visualized. ?No  corresponding mass noted on the CT. Portal vein is patent on ?color Doppler imaging with normal direction of blood flow towards ?the liver. ?  ?IMPRESSION: ?1.  Cholelithiasis without sonographic evidence of acute ?cholecystitis. ?2. Heterogeneous liver. ?  ?Abdominal MRI 11/25/2020: ?Lower chest: Heterogeneous signal in the dependent lungs favored ?atelectasis. ?  ?Hepatobiliary: Mild diffuse hepatic steatosis. Slight nodularity of ?the hepatic contour possibly reflecting changes of cirrhosis. ?Well-circumscribed T2 hyperintense nonenhancing 3.8 cm hepatic cyst ?on image 28/3 which corresponds with the lesion seen on prior CT and ?ultrasound. No suspicious hepatic lesion. ?  ?Cholelithiasis in a distended gallbladder without pericholecystic ?fluid. No biliary ductal dilation. The common bile duct measures 4 ?mm. No choledocholithiasis. ?  ?Pancreas: Intrinsic T1 signal of the pancreatic parenchyma is within ?normal limits. No pancreatic ductal dilation. No suspicious ?pancreatic mass visualized. ?  ?Spleen:  Within normal limits. ?  ?Adrenals/Urinary Tract: Bilateral adrenal glands are unremarkable. ?No hydronephrosis. No solid enhancing renal mass. ?  ?Stomach/Bowel: No evidence of acute bowel inflammation or ?obstruction. Colonic diverticulosis without findings of acute ?diverticulitis. ?  ?Vascular/Lymphatic: No abdominal aortic aneurysm. The portal, ?splenic and superior mesenteric veins are patent. Prominent ?periportal lymph nodes measuring up to 8 mm. ?  ?Other:  No abdominal ascites. ?  ?Musculoskeletal: No suspicious bone lesions identified. ?  ?IMPRESSION: ?1. Mild diffuse hepatic steatosis with slight nodularity of the ?hepatic contour possibly reflecting changes of cirrhosis. No ascites ?or splenomegaly. ?2. Well-circumscribed nonenhancing 3.8 cm hepatic cyst which ?corresponds with the lesion seen on prior CT. No suspicious hepatic ?lesion. ?3. Cholelithiasis in a distended gallbladder without pericholecystic ?fluid. No biliary ductal dilation or choledocholithiasis.  ? ? ?PAST GI PROCEDURES: ?  ?Colonoscopy 12/14/2019 by Dr. Havery Moros: ?- A single colonic  angiodysplastic lesion. ?- One diminutive polyp in the ascending colon, removed with a cold biopsy forceps. Resected ?and retrieved. ?- One 3 mm polyp in the sigmoid colon, removed with a cold biopsy forceps. Resected and ?retrieved. ?- Internal hemorrhoids. ?- The examination was otherwise normal. ?- 7 year colonoscopy recall ?- TUBULAR ADENOMA. ?- NO HIGH-GRADE DYSPLASIA OR CARCINOMA. ? ? ?RUQ [place] with Elastography 3/14 - IMPRESSION: ?ULTRASOUND RUQ: ?Coarsened hepatic echotexture with increased hepatic echogenicity ?and subtle contour nodularity suggestive hepatic steatosis with ?possible cirrhosis. ?ULTRASOUND HEPATIC ELASTOGRAPHY: ?Median kPa:  9.5 ?  ?Diagnostic category: >9 kPa and ?13 kPa: suggestive of cACLD, but ?needs further testing ? ? ?Past Medical History:  ?Diagnosis Date  ? Borderline diabetes   ? Cholelithiases 11/25/2020  ? COVID-19 virus infection 06/14/2020  ? Diabetes mellitus without complication ([place])   ? Hyperlipidemia   ? Hypertension   ? Hypoxemia 11/25/2020  ? Sepsis ([place]) 11/25/2020  ? Sleep apnea   ? wears CPAP  ? Syncope 11/25/2020  ? Vasovagal reaction   ? ? ? ?Past Surgical History:  ?Procedure Laterality Date  ? CHOLECYSTECTOMY N/A 11/27/2020  ? Procedure: LAPAROSCOPIC CHOLECYSTECTOMY;  Surgeon: Georganna Skeans, MD;  Location: [facility];  Service: General;  Laterality: N/A;  ? EYE SURGERY    ? Corneal dystrophies  ? ?Family History  ?Adopted: Yes  ?Problem Relation Age of Onset  ? Other Other   ?     adopted  ? Thyroid disease Neg Hx   ? ?Social History  ? ?Tobacco Use  ? Smoking status: Never  ? Smokeless tobacco: Never  ?Vaping Use  ? Vaping Use: Never used  ?Substance Use Topics  ? Alcohol use: Not Currently  ?  Alcohol/week: 0.0 standard drinks  ?  Comment: socially  ? Drug use: No  ? ?Current Outpatient Medications  ?Medication Sig Dispense Refill  ? lisinopril-hydrochlorothiazide (ZESTORETIC) 10-12.5 MG tablet Take 1 tablet by mouth daily. For blood pressure. 90 tablet 2  ? metFORMIN  (GLUCOPHAGE-XR) 500 MG 24 hr tablet Take 1 tablet (500 mg total) by mouth daily with breakfast. For diabetes. 90 tablet 1  ? prednisoLONE acetate (PRED FORTE) 1 % ophthalmic suspension Place 1 drop into the left

## 2021-06-21 ENCOUNTER — Telehealth: Payer: Self-pay

## 2021-06-21 NOTE — Telephone Encounter (Signed)
Called Dr. Coral Spikes' office at [phone] to check on referral.  Spoke to [facility] who indicated patient received 3 "robo" calls and has not responded. I asked that they call him directly to see if he is interested in an appointment. Cyndy Freeze indicated she would call patient and call me back to advise regarding scheduling.

## 2021-06-21 NOTE — Telephone Encounter (Signed)
Dr. Marga Melnick office call to let you know they have reached out to the patient to get him scheduled.

## 2021-07-04 ENCOUNTER — Ambulatory Visit
Admission: RE | Admit: 2021-07-04 | Discharge: 2021-07-04 | Disposition: A | Discharge status: Home / Self Care | Payer: 59 | Source: Ambulatory Visit | Attending: Thoracic Surgery (Cardiothoracic Vascular Surgery) | Admitting: Thoracic Surgery (Cardiothoracic Vascular Surgery)

## 2021-07-04 DIAGNOSIS — I7121 Aneurysm of the ascending aorta, without rupture: Secondary | ICD-10-CM

## 2021-07-04 MED ORDER — IOPAMIDOL (ISOVUE-370) INJECTION 76%
75.0000 mL | Freq: Once | INTRAVENOUS | Status: AC | PRN
  Administered 2021-07-04: 75 mL via INTRAVENOUS

## 2021-07-09 ENCOUNTER — Ambulatory Visit (INDEPENDENT_AMBULATORY_CARE_PROVIDER_SITE_OTHER): Payer: 59 | Admitting: Thoracic Surgery (Cardiothoracic Vascular Surgery)

## 2021-07-09 ENCOUNTER — Encounter: Payer: Self-pay | Admitting: Thoracic Surgery (Cardiothoracic Vascular Surgery)

## 2021-07-09 VITALS — BP 116/79 | HR 81 | Resp 20 | Ht 73.0 in | Wt 313.0 lb

## 2021-07-09 DIAGNOSIS — I7121 Aneurysm of the ascending aorta, without rupture: Secondary | ICD-10-CM | POA: Diagnosis not present

## 2021-07-09 NOTE — Progress Notes (Signed)
Antonio Horton       ,Antonio Horton     HPI: Antonio Horton returns for follow-up of his ascending aortic aneurysm.  Antonio Horton is a 56 year old man with a past medical history significant for morbid obesity, hypertension, hyperlipidemia, sleep apnea, type 2 diabetes without complication, vasovagal syncope, and an ascending aortic aneurysm.    He presented in November 2022 with multiple syncopal episodes.  As part of his work-up he had a CT which showed a 5.2 cm ascending aneurysm.  He has been feeling well.  He is not having any chest pain, pressure, or tightness.  A little anxiety about the aneurysm.  He has not been able to lose weight.  He is back to his gained a few pounds since his last visit.  He is awaiting an appointment with the bariatric dietitians in the near future.  No syncopal spell since his last visit.  Past Medical History:  Diagnosis Date   Borderline diabetes    Cholelithiases 11/25/2020   COVID-19 virus infection 06/14/2020   Diabetes mellitus without complication ([place])    Hyperlipidemia    Hypertension    Hypoxemia 11/25/2020   Sepsis ([place]) 11/25/2020   Sleep apnea    wears CPAP   Syncope 11/25/2020   Vasovagal reaction     Current Outpatient Medications  Medication Sig Dispense Refill   lisinopril-hydrochlorothiazide (ZESTORETIC) 10-12.5 MG tablet Take 1 tablet by mouth daily. For blood pressure. 90 tablet 2   metFORMIN (GLUCOPHAGE-XR) 500 MG 24 hr tablet Take 1 tablet (500 mg total) by mouth daily with breakfast. For diabetes. 90 tablet 1   prednisoLONE acetate (PRED FORTE) 1 % ophthalmic suspension Place 1 drop into the left eye daily.      rosuvastatin (CRESTOR) 5 MG tablet Take 1 tablet (5 mg total) by mouth daily. For cholesterol. 90 tablet 3   No current facility-administered medications for this visit.    Physical Exam BP 116/79 (BP Location: Left Arm, Patient Position: Sitting, Cuff Size:  Large)   Pulse 81   Resp 20   Ht '6\' 1"'$  (1.854 m)   Wt (!) 313 lb (142 kg)   SpO2 100% Comment: RA  BMI 41.30 kg/m  Obese 56 year old man in no acute distress Alert and oriented x3 with no focal deficits Lungs clear bilaterally No carotid bruits Cardiac regular rate and rhythm No peripheral edema  Diagnostic Tests: CT ANGIOGRAPHY CHEST WITH CONTRAST   TECHNIQUE: Multidetector CT imaging of the chest was performed using the standard protocol during bolus administration of intravenous contrast. Multiplanar CT image reconstructions and MIPs were obtained to evaluate the vascular anatomy.   RADIATION DOSE REDUCTION: This exam was performed according to the departmental dose-optimization program which includes automated exposure control, adjustment of the mA and/or kV according to patient size and/or use of iterative reconstruction technique.   CONTRAST:  13m ISOVUE-370 IOPAMIDOL (ISOVUE-370) INJECTION 76%   COMPARISON:  November 24, 2020   FINDINGS: Cardiovascular: Preferential opacification of the thoracic aorta. As before, again seen is the. Ectasia at the aortic root measuring 3.8 cm. Ascending thoracic aorta measures 5.2 cm in greatest dimension, stable. No dissection or intramural hematoma. No filling defects seen in the pulmonary arteries up to the segmental divisions. No pericardial effusion. Normal heart size. No pericardial effusion.   Mediastinum/Nodes: No enlarged mediastinal, hilar, or axillary lymph nodes. Thyroid gland, trachea, and esophagus demonstrate  no significant findings.   Lungs/Pleura: Lungs are clear. No pleural effusion or pneumothorax.   Upper Abdomen: There has been interval cholecystectomy. Again seen is an area of hypodensity in the posterior segment of the right hepatic lobe measuring 3.7 cm in greatest dimension with the density measuring 27 Hounsfield units without significant interval change.   Musculoskeletal: No chest wall abnormality.  No acute or significant osseous findings.   Review of the MIP images confirms the above findings.   IMPRESSION: Aneurysm of the ascending thoracic aorta measuring 5.2 cm in greatest dimension without significant interval change.   Recommend semi-annual imaging followup by CTA or MRA and referral to cardiothoracic surgery if not already obtained. This recommendation follows 2010 ACCF/AHA/AATS/ACR/ASA/SCA/SCAI/SIR/STS/SVM Guidelines for the Diagnosis and Management of Patients With Thoracic Aortic Disease. Circulation. 2010; 121: [id number]. Aortic aneurysm NOS (ICD10-I71.9)   There has been interval cholecystectomy. Stable hypodense lesion in the posterior segment of the right hepatic lobe, a proven hepatic cyst in the MRI of the abdomen dated November 25, 2020.     Electronically Signed   By: Frazier Richards M.D.   On: 07/04/2021 12:13 I personally reviewed the CT images and concur with the findings of a 5.2 cm ascending aneurysm.  Rather abrupt change back to normal diameter past the takeoff of the innominate artery.  Impression: Antonio Horton is a 56 year old man with a past medical history significant for morbid obesity, hypertension, hyperlipidemia, sleep apnea, type 2 diabetes without complication, vasovagal syncope, and an ascending aortic aneurysm.    Ascending aortic aneurysm-stable at 5.2 cm.  Needs continued semiannual follow-up.  Blood pressure well controlled.  Hypertension-blood pressure well controlled on lisinopril/hydrochlorothiazide  Hyperlipidemia-on Crestor.  Morbid obesity/type 2 diabetes-he understands importance of weight loss.  He had lost a considerable amount of weight but now has hit a roadblock with further weight loss.  Awaiting appointment with bariatric dietitians.  Plan: Return in 6 months with CT angio of chest  I spent over 20 minutes in review of records, images, and in consultation with Antonio Horton today Melrose Nakayama, MD [facility] [phone]

## 2021-09-04 ENCOUNTER — Telehealth: Payer: Self-pay

## 2021-09-04 DIAGNOSIS — R932 Abnormal findings on diagnostic imaging of liver and biliary tract: Secondary | ICD-10-CM

## 2021-09-04 DIAGNOSIS — K760 Fatty (change of) liver, not elsewhere classified: Secondary | ICD-10-CM

## 2021-09-04 NOTE — Telephone Encounter (Signed)
-----   Message from Roetta Sessions, [place] sent at 05/29/2021  2:32 PM EDT ----- Regarding: RUQ U/S and liver labs due in Sept Due around the week of Sept 11th  RUQ U/S and labs (cbc, cmet,  PT/INR and AFP) for fatty liver and abn liver imaging

## 2021-09-04 NOTE — Telephone Encounter (Signed)
MyChart message sent to patient that he can go for labs at his convenience, will be contacted by Radiology Scheduling to schedule RUQ U/S..  Message sent to radiology scheduling to call patient to schedule liver ultrasound in Sept.

## 2021-09-10 ENCOUNTER — Encounter: Payer: Self-pay | Admitting: Primary Care

## 2021-09-10 ENCOUNTER — Other Ambulatory Visit: Payer: Self-pay | Admitting: Primary Care

## 2021-09-10 ENCOUNTER — Ambulatory Visit (INDEPENDENT_AMBULATORY_CARE_PROVIDER_SITE_OTHER): Payer: 59 | Admitting: Primary Care

## 2021-09-10 VITALS — BP 110/62 | HR 72 | Temp 98.6°F | Ht 73.0 in | Wt 319.0 lb

## 2021-09-10 DIAGNOSIS — E119 Type 2 diabetes mellitus without complications: Secondary | ICD-10-CM

## 2021-09-10 DIAGNOSIS — E1165 Type 2 diabetes mellitus with hyperglycemia: Secondary | ICD-10-CM

## 2021-09-10 LAB — POCT GLYCOSYLATED HEMOGLOBIN (HGB A1C): Hemoglobin A1C: 7.1 % — AB (ref 4.0–5.6)

## 2021-09-10 MED ORDER — METFORMIN HCL ER 500 MG PO TB24: 1000.0000 mg | ORAL_TABLET | Freq: Every day | ORAL | 0 refills | Status: DC

## 2021-09-10 NOTE — Patient Instructions (Signed)
Consider treatment with Ozempic, Trulicity, or Moujaro for diabetes and weight loss.  Please schedule a physical to meet with me in 6 months.   It was a pleasure to see you today!

## 2021-09-10 NOTE — Progress Notes (Signed)
Subjective:    Patient ID: Antonio Horton, male    DOB: 11-24-1965, 56 y.o.   MRN: [id number]  HPI  Antonio Horton is a very pleasant 56 y.o. male with a history of CAD, hypertension, type 2 diabetes, hepatic steatosis, hyperlipidemia, multinodular goiter who presents today for follow-up of diabetes.  Current medications include: Metformin XR 1000 mg daily  He is checking his blood glucose 0 times daily.  Last A1C: 7.2 in February 2023, 7.1 today Last Eye Exam: Up-to-date Last Foot Exam: Due Pneumonia Vaccination: 2021 Urine Microalbumin: None.  Managed on ACE inhibitor Statin: Rosuvastatin  Dietary changes since last visit: Increased intake of junk food/take out food. Occasional sweets.    Exercise: No regular exercise  BP Readings from Last 3 Encounters:  09/10/21 110/62  07/09/21 116/79  05/29/21 116/70   Wt Readings from Last 3 Encounters:  09/10/21 (!) 319 lb (144.7 kg)  07/09/21 (!) 313 lb (142 kg)  05/29/21 (!) 311 lb 6.4 oz (141.3 kg)     Review of Systems  Respiratory:  Negative for shortness of breath.   Cardiovascular:  Negative for chest pain.  Gastrointestinal:  Negative for abdominal pain and diarrhea.  Neurological:  Negative for numbness.         Past Medical History:  Diagnosis Date   Borderline diabetes    Cholelithiases 11/25/2020   COVID-19 virus infection 06/14/2020   Diabetes mellitus without complication ([place])    Hyperlipidemia    Hypertension    Hypoxemia 11/25/2020   Sepsis ([place]) 11/25/2020   Sleep apnea    wears CPAP   Syncope 11/25/2020   Vasovagal reaction     Social History   Socioeconomic History   Marital status: Married    Spouse name: Not on file   Number of children: Not on file   Years of education: Not on file   Highest education level: Not on file  Occupational History   Not on file  Tobacco Use   Smoking status: Never   Smokeless tobacco: Never  Vaping Use   Vaping Use: Never used  Substance and  Sexual Activity   Alcohol use: Not Currently    Alcohol/week: 0.0 standard drinks of alcohol    Comment: socially   Drug use: No   Sexual activity: Not on file  Other Topics Concern   Not on file  Social History Narrative   Married.   2 children.   Works as a Administrator, sports.   Enjoys reading.    Social Determinants of Health   Financial Resource Strain: Not on file  Food Insecurity: Not on file  Transportation Needs: Not on file  Physical Activity: Not on file  Stress: Not on file  Social Connections: Not on file  Intimate Partner Violence: Not on file    Past Surgical History:  Procedure Laterality Date   CHOLECYSTECTOMY N/A 11/27/2020   Procedure: LAPAROSCOPIC CHOLECYSTECTOMY;  Surgeon: Georganna Skeans, MD;  Location: [facility];  Service: General;  Laterality: N/A;   EYE SURGERY     Corneal dystrophies    Family History  Adopted: Yes  Problem Relation Age of Onset   Other Other        adopted   Thyroid disease Neg Hx     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    Current Outpatient Medications on File Prior to Visit  Medication Sig Dispense Refill   lisinopril-hydrochlorothiazide (ZESTORETIC) 10-12.5 MG tablet Take 1 tablet  by mouth daily. For blood pressure. 90 tablet 2   prednisoLONE acetate (PRED FORTE) 1 % ophthalmic suspension Place 1 drop into the left eye daily.      rosuvastatin (CRESTOR) 5 MG tablet Take 1 tablet (5 mg total) by mouth daily. For cholesterol. 90 tablet 3   No current facility-administered medications on file prior to visit.    BP 110/62   Pulse 72   Temp 98.6 F (37 C) (Oral)   Ht '6\' 1"'$  (1.854 m)   Wt (!) 319 lb (144.7 kg)   SpO2 96%   BMI 42.09 kg/m  Objective:   Physical Exam Cardiovascular:     Rate and Rhythm: Normal rate and regular rhythm.  Pulmonary:     Effort: Pulmonary effort is normal.     Breath sounds: Normal breath sounds. No wheezing or rales.  Musculoskeletal:     Cervical  back: Neck supple.  Skin:    General: Skin is warm and dry.  Neurological:     Mental Status: He is alert and oriented to person, place, and time.           Assessment & Plan:   Problem List Items Addressed This Visit       Endocrine   Type 2 diabetes mellitus with hyperglycemia ([place]) - Primary    Overall about the same, would like to see A1C <7.0, discussed this today with patient.   Recommended initiation of GLP1 receptor agonist such as Ozempic, Mounjaro, Trulicity for better diabetes control and for additive effects of weight loss. He kindly declines today but will think about this and update. I've also asked for him to contact his insurance company regarding coverage for any of these options.   He is meeting with health weight and wellness next week and will update on his decision. If he foregoes GLP 1 treatment then recommend increasing metformin to 1000 mg in AM and 500 mg in PM.   Foot exam today. Managed on statin and ACE-I. Pneumonia vaccine UTD.  Follow up in 6 months.      Relevant Medications   metFORMIN (GLUCOPHAGE-XR) 500 MG 24 hr tablet   Other Relevant Orders   POCT glycosylated hemoglobin (Hb A1C) (Completed)   Other Visit Diagnoses     Type 2 diabetes mellitus without complication, without long-term current use of insulin ([place])       Relevant Medications   metFORMIN (GLUCOPHAGE-XR) 500 MG 24 hr tablet          Pleas Koch, NP

## 2021-09-10 NOTE — Assessment & Plan Note (Signed)
Overall about the same, would like to see A1C <7.0, discussed this today with patient.   Recommended initiation of GLP1 receptor agonist such as Ozempic, Mounjaro, Trulicity for better diabetes control and for additive effects of weight loss. He kindly declines today but will think about this and update. I've also asked for him to contact his insurance company regarding coverage for any of these options.   He is meeting with health weight and wellness next week and will update on his decision. If he foregoes GLP 1 treatment then recommend increasing metformin to 1000 mg in AM and 500 mg in PM.   Foot exam today. Managed on statin and ACE-I. Pneumonia vaccine UTD.  Follow up in 6 months.

## 2021-09-11 ENCOUNTER — Encounter: Payer: Self-pay | Admitting: Primary Care

## 2021-09-20 ENCOUNTER — Ambulatory Visit (INDEPENDENT_AMBULATORY_CARE_PROVIDER_SITE_OTHER): Payer: 59 | Admitting: Family Medicine

## 2021-09-20 VITALS — BP 107/71 | HR 70 | Temp 97.8°F | Ht 73.0 in | Wt 315.0 lb

## 2021-09-20 DIAGNOSIS — E1169 Type 2 diabetes mellitus with other specified complication: Secondary | ICD-10-CM | POA: Insufficient documentation

## 2021-09-20 DIAGNOSIS — I2510 Atherosclerotic heart disease of native coronary artery without angina pectoris: Secondary | ICD-10-CM | POA: Insufficient documentation

## 2021-09-20 DIAGNOSIS — E785 Hyperlipidemia, unspecified: Secondary | ICD-10-CM | POA: Insufficient documentation

## 2021-09-20 DIAGNOSIS — Z0289 Encounter for other administrative examinations: Secondary | ICD-10-CM

## 2021-09-20 DIAGNOSIS — E782 Mixed hyperlipidemia: Secondary | ICD-10-CM | POA: Insufficient documentation

## 2021-09-20 DIAGNOSIS — K760 Fatty (change of) liver, not elsewhere classified: Secondary | ICD-10-CM | POA: Insufficient documentation

## 2021-09-20 DIAGNOSIS — E119 Type 2 diabetes mellitus without complications: Secondary | ICD-10-CM | POA: Insufficient documentation

## 2021-09-20 DIAGNOSIS — Z6841 Body Mass Index (BMI) 40.0 and over, adult: Secondary | ICD-10-CM | POA: Insufficient documentation

## 2021-09-20 DIAGNOSIS — I10 Essential (primary) hypertension: Secondary | ICD-10-CM

## 2021-09-20 HISTORY — DX: Type 2 diabetes mellitus with other specified complication: E11.69

## 2021-09-20 NOTE — Progress Notes (Addendum)
Office: [phone]  /  Fax: [phone]  Initial Visit  Antonio Horton was seen in clinic today to evaluate for obesity. He is interested in losing weight to improve overall health and reduce the risk of weight related complications.   He presents today to review program treatment options, initial physical assessment, and evaluation.      Past medical history includes:   Past Medical History:  Diagnosis Date   Borderline diabetes    Cholelithiases 11/25/2020   COVID-19 virus infection 06/14/2020   Diabetes mellitus without complication ([place])    Hyperlipidemia    Hypertension    Hypoxemia 11/25/2020   Sepsis ([place]) 11/25/2020   Sleep apnea    wears CPAP   Syncope 11/25/2020   Vasovagal reaction      Objective:   BP 107/71   Pulse 70   Temp 97.8 F (36.6 C)   Ht '6\' 1"'$  (1.854 m)   Wt (!) 315 lb (142.9 kg)   SpO2 96%   BMI 41.56 kg/m  He was weighed on the bioimpedance scale:  Body mass index is 41.56 kg/m.  General:  Alert, oriented and cooperative. Patient is in no acute distress.  Respiratory: Normal respiratory effort, no problems with respiration noted  Extremities: Normal range of motion.    Mental Status: Normal mood and affect. Normal behavior. Normal judgment and thought content.   Assessment and Plan:  1. Type 2 diabetes mellitus with other specified complication, without long-term current use of insulin ([place])  2. Mixed hyperlipidemia  3. Essential hypertension  4. Coronary artery disease involving native coronary artery of native heart without angina pectoris  5. Non-alcoholic fatty liver disease  6. Class 3 severe obesity with serious comorbidity and body mass index (BMI) of 40.0 to 44.9 in adult, unspecified obesity type ([place])      Obesity Treatment Plan:  He will work on garnering support from family and friends to begin weight loss journey. Work on eliminating or reducing the presence of highly processed, calorie dense foods in the  home. Complete provided nutritional and psychosocial assessment questionnaire.    Antonio Horton will follow up in the next 1-2 weeks to review the above steps and continue evaluation and treatment.  Obesity Education Performed Today:  He was weighed on the bioimpedance scale and results were discussed and documented in the synopsis.  We discussed obesity as a disease and the importance of a more detailed evaluation of all the factors contributing to the disease.  We discussed the importance of long term lifestyle changes which include nutrition, exercise and behavioral modifications as well as the importance of customizing this to his specific health and social needs.  We discussed the benefits of reaching a healthier weight to alleviate the symptoms of existing conditions and reduce the risks of the biomechanical, metabolic and psychological effects of obesity.  We discussed the goals of this program is to improve his overall health and not simply achieve a specific BMI.  Frequent visits are very important to patient success. I plan to see him every 2 weeks for the first 3 months and then evaluate the visit frequency after that time. I explained obesity is a life-long chronic disease and long term treatments would be required. Medications to help him follow his eating plan may be offered as appropriate but are not required. All medication decisions will be made together after the initial workup is done and benefits and side effects are discussed in depth.  The clinic rules were reviewed including  the late policy, cancellation policy, no show and program fees.  Antonio Horton appears to be in the action stage of change and states they are ready to start intensive lifestyle modifications and behavioral modifications.  43 minutes was spent today on this visit including the above counseling, pre-visit chart review, and post-visit documentation.  Dennard Nip, MD

## 2021-09-25 ENCOUNTER — Ambulatory Visit (INDEPENDENT_AMBULATORY_CARE_PROVIDER_SITE_OTHER): Payer: 59 | Admitting: Family Medicine

## 2021-09-25 ENCOUNTER — Encounter (INDEPENDENT_AMBULATORY_CARE_PROVIDER_SITE_OTHER): Payer: Self-pay | Admitting: Family Medicine

## 2021-09-25 VITALS — BP 110/68 | HR 67 | Temp 98.0°F | Ht 73.0 in | Wt 313.0 lb

## 2021-09-25 DIAGNOSIS — Z1331 Encounter for screening for depression: Secondary | ICD-10-CM

## 2021-09-25 DIAGNOSIS — R5383 Other fatigue: Secondary | ICD-10-CM | POA: Diagnosis not present

## 2021-09-25 DIAGNOSIS — E782 Mixed hyperlipidemia: Secondary | ICD-10-CM

## 2021-09-25 DIAGNOSIS — K760 Fatty (change of) liver, not elsewhere classified: Secondary | ICD-10-CM

## 2021-09-25 DIAGNOSIS — E559 Vitamin D deficiency, unspecified: Secondary | ICD-10-CM | POA: Insufficient documentation

## 2021-09-25 DIAGNOSIS — Z6841 Body Mass Index (BMI) 40.0 and over, adult: Secondary | ICD-10-CM

## 2021-09-25 DIAGNOSIS — E1169 Type 2 diabetes mellitus with other specified complication: Secondary | ICD-10-CM

## 2021-09-25 DIAGNOSIS — R0602 Shortness of breath: Secondary | ICD-10-CM

## 2021-09-25 DIAGNOSIS — I10 Essential (primary) hypertension: Secondary | ICD-10-CM

## 2021-09-25 DIAGNOSIS — E66813 Obesity, class 3: Secondary | ICD-10-CM

## 2021-09-25 HISTORY — DX: Other fatigue: R53.83

## 2021-09-25 HISTORY — DX: Shortness of breath: R06.02

## 2021-09-26 ENCOUNTER — Other Ambulatory Visit: Payer: Self-pay | Admitting: Primary Care

## 2021-09-26 DIAGNOSIS — I10 Essential (primary) hypertension: Secondary | ICD-10-CM

## 2021-09-26 LAB — CBC WITH DIFFERENTIAL
Basophils Absolute: 0.1 10*3/uL (ref 0.0–0.2)
Basos: 1 %
EOS (ABSOLUTE): 0.2 10*3/uL (ref 0.0–0.4)
Eos: 3 %
Hematocrit: 46.5 % (ref 37.5–51.0)
Hemoglobin: 15.9 g/dL (ref 13.0–17.7)
Immature Grans (Abs): 0 10*3/uL (ref 0.0–0.1)
Immature Granulocytes: 0 %
Lymphocytes Absolute: 2.2 10*3/uL (ref 0.7–3.1)
Lymphs: 35 %
MCH: 31.2 pg (ref 26.6–33.0)
MCHC: 34.2 g/dL (ref 31.5–35.7)
MCV: 91 fL (ref 79–97)
Monocytes Absolute: 0.6 10*3/uL (ref 0.1–0.9)
Monocytes: 9 %
Neutrophils Absolute: 3.3 10*3/uL (ref 1.4–7.0)
Neutrophils: 52 %
RBC: 5.1 x10E6/uL (ref 4.14–5.80)
RDW: 13.1 % (ref 11.6–15.4)
WBC: 6.2 10*3/uL (ref 3.4–10.8)

## 2021-09-26 LAB — CMP14+EGFR
ALT: 30 IU/L (ref 0–44)
AST: 24 IU/L (ref 0–40)
Albumin/Globulin Ratio: 1.8 (ref 1.2–2.2)
Albumin: 4.5 g/dL (ref 3.8–4.9)
Alkaline Phosphatase: 49 IU/L (ref 44–121)
BUN/Creatinine Ratio: 16 (ref 9–20)
BUN: 15 mg/dL (ref 6–24)
Bilirubin Total: 0.7 mg/dL (ref 0.0–1.2)
CO2: 24 mmol/L (ref 20–29)
Calcium: 9.6 mg/dL (ref 8.7–10.2)
Chloride: 99 mmol/L (ref 96–106)
Creatinine, Ser: 0.92 mg/dL (ref 0.76–1.27)
Globulin, Total: 2.5 g/dL (ref 1.5–4.5)
Glucose: 143 mg/dL — ABNORMAL HIGH (ref 70–99)
Potassium: 4.2 mmol/L (ref 3.5–5.2)
Sodium: 139 mmol/L (ref 134–144)
Total Protein: 7 g/dL (ref 6.0–8.5)
eGFR: 99 mL/min/{1.73_m2} (ref >59)

## 2021-09-26 LAB — T3: T3, Total: 137 ng/dL (ref 71–180)

## 2021-09-26 LAB — T4, FREE: Free T4: 1.25 ng/dL (ref 0.82–1.77)

## 2021-09-26 LAB — HEMOGLOBIN A1C
Est. average glucose Bld gHb Est-mCnc: 166 mg/dL
Hgb A1c MFr Bld: 7.4 % — ABNORMAL HIGH (ref 4.8–5.6)

## 2021-09-26 LAB — LIPID PANEL WITH LDL/HDL RATIO
Cholesterol, Total: 121 mg/dL (ref 100–199)
HDL: 43 mg/dL (ref >39)
LDL Chol Calc (NIH): 57 mg/dL (ref 0–99)
LDL/HDL Ratio: 1.3 ratio (ref 0.0–3.6)
Triglycerides: 114 mg/dL (ref 0–149)
VLDL Cholesterol Cal: 21 mg/dL (ref 5–40)

## 2021-09-26 LAB — INSULIN, RANDOM: INSULIN: 41.9 u[IU]/mL — ABNORMAL HIGH (ref 2.6–24.9)

## 2021-09-26 LAB — VITAMIN D 25 HYDROXY (VIT D DEFICIENCY, FRACTURES): Vit D, 25-Hydroxy: 19.9 ng/mL — ABNORMAL LOW (ref 30.0–100.0)

## 2021-09-26 LAB — MAGNESIUM: Magnesium: 2 mg/dL (ref 1.6–2.3)

## 2021-09-26 LAB — VITAMIN B12: Vitamin B-12: 334 pg/mL (ref 232–1245)

## 2021-09-26 LAB — TSH: TSH: 1.35 u[IU]/mL (ref 0.450–4.500)

## 2021-09-26 MED ORDER — FREESTYLE LIBRE 3 SENSOR MISC: 1 refills | Status: DC

## 2021-10-07 ENCOUNTER — Other Ambulatory Visit (INDEPENDENT_AMBULATORY_CARE_PROVIDER_SITE_OTHER): Payer: 59

## 2021-10-07 ENCOUNTER — Ambulatory Visit (INDEPENDENT_AMBULATORY_CARE_PROVIDER_SITE_OTHER): Payer: 59 | Admitting: Gastroenterology

## 2021-10-07 DIAGNOSIS — K760 Fatty (change of) liver, not elsewhere classified: Secondary | ICD-10-CM | POA: Diagnosis not present

## 2021-10-07 DIAGNOSIS — Z23 Encounter for immunization: Secondary | ICD-10-CM | POA: Diagnosis not present

## 2021-10-07 DIAGNOSIS — R932 Abnormal findings on diagnostic imaging of liver and biliary tract: Secondary | ICD-10-CM | POA: Diagnosis not present

## 2021-10-07 LAB — COMPREHENSIVE METABOLIC PANEL
ALT: 22 U/L (ref 0–53)
AST: 17 U/L (ref 0–37)
Albumin: 4.2 g/dL (ref 3.5–5.2)
Alkaline Phosphatase: 43 U/L (ref 39–117)
BUN: 22 mg/dL (ref 6–23)
CO2: 28 mEq/L (ref 19–32)
Calcium: 9.8 mg/dL (ref 8.4–10.5)
Chloride: 101 mEq/L (ref 96–112)
Creatinine, Ser: 1.14 mg/dL (ref 0.40–1.50)
GFR: 72.78 mL/min (ref >60.00)
Glucose, Bld: 137 mg/dL — ABNORMAL HIGH (ref 70–99)
Potassium: 4.3 mEq/L (ref 3.5–5.1)
Sodium: 138 mEq/L (ref 135–145)
Total Bilirubin: 0.9 mg/dL (ref 0.2–1.2)
Total Protein: 7.6 g/dL (ref 6.0–8.3)

## 2021-10-07 LAB — CBC WITH DIFFERENTIAL/PLATELET
Basophils Absolute: 0.1 10*3/uL (ref 0.0–0.1)
Basophils Relative: 1.1 % (ref 0.0–3.0)
Eosinophils Absolute: 0.2 10*3/uL (ref 0.0–0.7)
Eosinophils Relative: 3.3 % (ref 0.0–5.0)
HCT: 44.5 % (ref 39.0–52.0)
Hemoglobin: 15.1 g/dL (ref 13.0–17.0)
Lymphocytes Relative: 33 % (ref 12.0–46.0)
Lymphs Abs: 2 10*3/uL (ref 0.7–4.0)
MCHC: 34 g/dL (ref 30.0–36.0)
MCV: 89.5 fl (ref 78.0–100.0)
Monocytes Absolute: 0.4 10*3/uL (ref 0.1–1.0)
Monocytes Relative: 7.2 % (ref 3.0–12.0)
Neutro Abs: 3.3 10*3/uL (ref 1.4–7.7)
Neutrophils Relative %: 55.4 % (ref 43.0–77.0)
Platelets: 216 10*3/uL (ref 150.0–400.0)
RBC: 4.98 Mil/uL (ref 4.22–5.81)
RDW: 13.6 % (ref 11.5–15.5)
WBC: 6 10*3/uL (ref 4.0–10.5)

## 2021-10-07 LAB — PROTIME-INR
INR: 1.2 ratio — ABNORMAL HIGH (ref 0.8–1.0)
Prothrombin Time: 13 s (ref 9.6–13.1)

## 2021-10-07 NOTE — Progress Notes (Unsigned)
Chief Complaint:   OBESITY Antonio Horton (MR# [id number]) is a 56 y.o. male who presents for evaluation and treatment of obesity and related comorbidities. Current BMI is Body mass index is 41.3 kg/m. Antonio Horton has been struggling with his weight for many years and has been unsuccessful in either losing weight, maintaining weight loss, or reaching his healthy weight goal.  Antonio Horton is currently in the action stage of change and ready to dedicate time achieving and maintaining a healthier weight. Antonio Horton is interested in becoming our patient and working on intensive lifestyle modifications including (but not limited to) diet and exercise for weight loss.  Antonio Horton's habits were reviewed today and are as follows: His family eats meals together, he thinks his family will eat healthier with him, his desired weight loss is 63 lbs, he has been heavy most of his life, he started gaining weight just after high school, his heaviest weight ever was 380 pounds, he has significant food cravings issues, he frequently makes poor food choices, he has problems with excessive hunger, he frequently eats larger portions than normal, and he struggles with emotional eating.  Depression Screen Antonio Horton's Food and Mood (modified PHQ-9) score was 6.     09/25/2021   11:24 AM  Depression screen PHQ 2/9  Decreased Interest 1  Down, Depressed, Hopeless 0  PHQ - 2 Score 1  Altered sleeping 1  Tired, decreased energy 1  Change in appetite 2  Feeling bad or failure about yourself  1  Trouble concentrating 0  Moving slowly or fidgety/restless 0  Suicidal thoughts 0  PHQ-9 Score 6  Difficult doing work/chores Not difficult at all   Subjective:   1. Other fatigue Huxton admits to daytime somnolence and admits to waking up still tired. Patient has a history of symptoms of daytime fatigue and morning fatigue. Antonio Horton generally gets 8 hours of sleep per night, and states that he has  nightime awakenings. Snoring is present. Apneic episodes are present. Epworth Sleepiness Score is 3.   2. SOBOE (shortness of breath on exertion) Antonio Horton notes increasing shortness of breath with exercising and seems to be worsening over time with weight gain. He notes getting out of breath sooner with activity than he used to. This has not gotten worse recently. Keyen denies shortness of breath at rest or orthopnea.  3. Type 2 diabetes mellitus with other specified complication, without long-term current use of insulin (H[facility]) Antonio Horton's last A1c was close to goal.  He is not on medications, and was advised on monitoring.  4. Essential hypertension Antonio Horton's blood pressure is well controlled on lisinopril-HCTZ.  Check renal parameters.   5. Mixed hyperlipidemia Antonio Horton's last level was at goal on rosuvastatin 5 mg daily without side effects.  6. Non-alcoholic fatty liver disease Antonio Horton is asymptomatic. Heterogeneity liver found on ultrasound from 2022.  7. Vitamin D deficiency Antonio Horton is asymptomatic.  Assessment/Plan:   1. Other fatigue Antonio Horton does feel that his weight is causing his energy to be lower than it should be. Fatigue may be related to obesity, depression or many other causes. Labs will be ordered, and in the meanwhile, Antonio Horton will focus on self care including making healthy food choices, increasing physical activity and focusing on stress reduction.  - EKG 12-Lead - CBC With Differential - CMP14+EGFR - Hemoglobin A1c - Insulin, random - Lipid Panel With LDL/HDL Ratio - TSH - T4, free - T3 - Vitamin B12 - VITAMIN D 25 Hydroxy (Vit-D Deficiency, Fractures) - Magnesium  2. SOBOE (shortness of breath on exertion) Antonio Horton does feel that he gets out of breath more easily that he used to when he exercises. Antonio Horton's shortness of breath appears to be obesity related and exercise induced. He has agreed to work on weight loss  and gradually increase exercise to treat his exercise induced shortness of breath. Will continue to monitor closely.  3. Type 2 diabetes mellitus with other specified complication, without long-term current use of insulin (HCC) Antonio Horton is to work on his weight loss, nutritional therapy, and will consider pharmacotherapy if goal is not met with lifestyle changes.  He was advised on monitoring in goals of care.  - Hemoglobin A1c  4. Essential hypertension Antonio Horton will continue to work on weight loss. We will check renal parameters.   5. Mixed hyperlipidemia Antonio Horton will continue statin, and we will check labs today.  We will follow-up at his next visit.  - Lipid Panel With LDL/HDL Ratio  6. Non-alcoholic fatty liver disease We will check liver enzymes today, and Antonio Horton will continue working on weight loss.  7. Vitamin D deficiency We will check labs today. Antonio Horton will follow-up for routine testing of Vitamin D, at least 2-3 times per year to avoid over-replacement.  - VITAMIN D 25 Hydroxy (Vit-D Deficiency, Fractures)  8. Depression screening Antonio Horton had a positive depression screening. Depression is commonly associated with obesity and often results in emotional eating behaviors. We will monitor this closely and work on CBT to help improve the non-hunger eating patterns. Referral to Psychology may be required if no improvement is seen as he continues in our clinic.  9. Class 3 severe obesity with serious comorbidity and body mass index (BMI) of 40.0 to 44.9 in adult, unspecified obesity type (HCC) Antonio Horton is currently in the action stage of change and his goal is to continue with weight loss efforts. I recommend Antonio Horton begin the structured treatment plan as follows:  He has agreed to the Category 4 Plan.  Exercise goals: All adults should avoid inactivity. Some physical activity is better than none, and adults who participate in any amount of physical  activity gain some health benefits.   Behavioral modification strategies: increasing lean protein intake, decreasing simple carbohydrates, increasing water intake, decreasing liquid calories, no skipping meals, meal planning and cooking strategies, keeping healthy foods in the home, and planning for success.  He was informed of the importance of frequent follow-up visits to maximize his success with intensive lifestyle modifications for his multiple health conditions. He was informed we would discuss his lab results at his next visit unless there is a critical issue that needs to be addressed sooner. Antonio Horton agreed to keep his next visit at the agreed upon time to discuss these results.  Objective:   Blood pressure 110/68, pulse 67, temperature 98 F (36.7 C), height '6\' 1"'  (1.854 m), weight (!) 313 lb (142 kg), SpO2 97 %. Body mass index is 41.3 kg/m.  EKG: Normal sinus rhythm, rate 71 BPM.  Indirect Calorimeter completed today shows a VO2 of 326 and a REE of 2246.  His calculated basal metabolic rate is 1779 thus his basal metabolic rate is worse than expected.  General: Cooperative, alert, well developed, in no acute distress. HEENT: Conjunctivae and lids unremarkable. Cardiovascular: Regular rhythm.  Lungs: Normal work of breathing. Neurologic: No focal deficits.   Lab Results  Component Value Date   CREATININE 1.14 10/07/2021   BUN 22 10/07/2021   NA 138 10/07/2021   K 4.3 10/07/2021  CL 101 10/07/2021   CO2 28 10/07/2021   Lab Results  Component Value Date   ALT 22 10/07/2021   AST 17 10/07/2021   ALKPHOS 43 10/07/2021   BILITOT 0.9 10/07/2021   Lab Results  Component Value Date   HGBA1C 7.4 (H) 09/25/2021   HGBA1C 7.1 (A) 09/10/2021   HGBA1C 7.2 (H) 03/13/2021   HGBA1C 6.6 (H) 11/25/2020   HGBA1C 5.4 03/28/2020   Lab Results  Component Value Date   INSULIN 41.9 (H) 09/25/2021   Lab Results  Component Value Date   TSH 1.350 09/25/2021   Lab Results   Component Value Date   CHOL 121 09/25/2021   HDL 43 09/25/2021   LDLCALC 57 09/25/2021   TRIG 114 09/25/2021   CHOLHDL 3 03/13/2021   Lab Results  Component Value Date   WBC 6.0 10/07/2021   HGB 15.1 10/07/2021   HCT 44.5 10/07/2021   MCV 89.5 10/07/2021   PLT 216.0 10/07/2021   Lab Results  Component Value Date   IRON 94 04/12/2021   FERRITIN 278.7 04/12/2021   Attestation Statements:   Reviewed by clinician on day of visit: allergies, medications, problem list, medical history, surgical history, family history, social history, and previous encounter notes.  I, Trixie Dredge, am acting as transcriptionist for Dennard Nip, MD.  I have reviewed the above documentation for accuracy and completeness, and I agree with the above. - ***

## 2021-10-09 ENCOUNTER — Encounter (INDEPENDENT_AMBULATORY_CARE_PROVIDER_SITE_OTHER): Payer: Self-pay | Admitting: Family Medicine

## 2021-10-09 ENCOUNTER — Ambulatory Visit (INDEPENDENT_AMBULATORY_CARE_PROVIDER_SITE_OTHER): Payer: 59 | Admitting: Family Medicine

## 2021-10-09 VITALS — BP 111/68 | HR 78 | Temp 97.9°F | Ht 73.0 in | Wt 306.0 lb

## 2021-10-09 DIAGNOSIS — Z7984 Long term (current) use of oral hypoglycemic drugs: Secondary | ICD-10-CM

## 2021-10-09 DIAGNOSIS — E538 Deficiency of other specified B group vitamins: Secondary | ICD-10-CM

## 2021-10-09 DIAGNOSIS — Z6841 Body Mass Index (BMI) 40.0 and over, adult: Secondary | ICD-10-CM

## 2021-10-09 DIAGNOSIS — E1169 Type 2 diabetes mellitus with other specified complication: Secondary | ICD-10-CM | POA: Diagnosis not present

## 2021-10-09 DIAGNOSIS — E669 Obesity, unspecified: Secondary | ICD-10-CM | POA: Diagnosis not present

## 2021-10-09 DIAGNOSIS — E559 Vitamin D deficiency, unspecified: Secondary | ICD-10-CM | POA: Diagnosis not present

## 2021-10-09 DIAGNOSIS — E8881 Metabolic syndrome: Secondary | ICD-10-CM | POA: Insufficient documentation

## 2021-10-09 DIAGNOSIS — E88819 Insulin resistance, unspecified: Secondary | ICD-10-CM | POA: Insufficient documentation

## 2021-10-09 DIAGNOSIS — K5909 Other constipation: Secondary | ICD-10-CM | POA: Insufficient documentation

## 2021-10-09 LAB — AFP TUMOR MARKER: AFP-Tumor Marker: 3.7 ng/mL (ref <6.1)

## 2021-10-09 MED ORDER — VITAMIN D (ERGOCALCIFEROL) 1.25 MG (50000 UNIT) PO CAPS: 50000.0000 [IU] | ORAL_CAPSULE | ORAL | 0 refills | Status: DC

## 2021-10-10 NOTE — Addendum Note (Signed)
Addended by: Dennard Nip D on: 10/10/2021 02:28 PM   Modules accepted: Orders, Level of Service

## 2021-10-15 NOTE — Progress Notes (Unsigned)
Chief Complaint:   OBESITY Antonio Horton is here to discuss his progress with his obesity treatment plan along with follow-up of his obesity related diagnoses. Antonio Horton is on the Category 4 Plan and states he is following his eating plan approximately 80% of the time. Antonio Horton states he is doing 0 minutes 0 times per week.  Today's visit was #: 2 Starting weight: 313 lbs Starting date: 09/25/2021 Today's weight: 306 lbs Today's date: 10/09/2021 Total lbs lost to date: 7 Total lbs lost since last in-office visit: 7  Interim History: Antonio Horton did very well with weight loss. His hunger was controlled and he struggled to eat all his dinner.   Subjective:   1. Vitamin D deficiency [name]'s last Vitamin D level was low and he notes fatigue. He is not on multivitamins.   2. B12 deficiency Antonio Horton has a new diagnosis. His last B12 level was low and he notes fatigue. He is not on multivitamins.   3. Type 2 diabetes mellitus with other specified complication, without long-term current use of insulin ([facility]) Khalif is on metformin, and he is working on diet and weight loss and he is doing well with this.   Assessment/Plan:   1. Vitamin D deficiency [place] agreed to start prescription Vitamin D 50,000 IU every week, with no refills. He will follow-up for routine testing of Vitamin D, at least 2-3 times per year to avoid over-replacement.  - Vitamin D, Ergocalciferol, (DRISDOL) 1.25 MG (50000 UNIT) CAPS capsule; Take 1 capsule (50,000 Units total) by mouth every 7 (seven) days.  Dispense: 4 capsule; Refill: 0  2. B12 deficiency The diagnosis was reviewed with the patient. Antonio Horton agreed to start OTC B12 1,000 mcg daily. Orders and follow up as documented in patient record.  3. Type 2 diabetes mellitus with other specified complication, without long-term current use of insulin ([facility]) Antonio Horton will continue with her diet, exercise, and medications. We will  continue to follow.   4. Obesity, Current BMI 40.4 [name] is currently in the action stage of change. As such, his goal is to continue with weight loss efforts. He has agreed to the Category 4 Plan.   Behavioral modification strategies: increasing lean protein intake.  Antonio Horton has agreed to follow-up with our clinic in 2 weeks. He was informed of the importance of frequent follow-up visits to maximize his success with intensive lifestyle modifications for his multiple health conditions.   Objective:   Blood pressure 111/68, pulse 78, temperature 97.9 F (36.6 C), height '6\' 1"'$  (1.854 m), weight (!) 306 lb (138.8 kg), SpO2 96 %. Body mass index is 40.37 kg/m.  General: Cooperative, alert, well developed, in no acute distress. HEENT: Conjunctivae and lids unremarkable. Cardiovascular: Regular rhythm.  Lungs: Normal work of breathing. Neurologic: No focal deficits.   Lab Results  Component Value Date   CREATININE 1.14 10/07/2021   BUN 22 10/07/2021   NA 138 10/07/2021   K 4.3 10/07/2021   CL 101 10/07/2021   CO2 28 10/07/2021   Lab Results  Component Value Date   ALT 22 10/07/2021   AST 17 10/07/2021   ALKPHOS 43 10/07/2021   BILITOT 0.9 10/07/2021   Lab Results  Component Value Date   HGBA1C 7.4 (H) 09/25/2021   HGBA1C 7.1 (A) 09/10/2021   HGBA1C 7.2 (H) 03/13/2021   HGBA1C 6.6 (H) 11/25/2020   HGBA1C 5.4 03/28/2020   Lab Results  Component Value Date   INSULIN 41.9 (H) 09/25/2021   Lab Results  Component  Value Date   TSH 1.350 09/25/2021   Lab Results  Component Value Date   CHOL 121 09/25/2021   HDL 43 09/25/2021   LDLCALC 57 09/25/2021   TRIG 114 09/25/2021   CHOLHDL 3 03/13/2021   Lab Results  Component Value Date   VD25OH 19.9 (L) 09/25/2021   Lab Results  Component Value Date   WBC 6.0 10/07/2021   HGB 15.1 10/07/2021   HCT 44.5 10/07/2021   MCV 89.5 10/07/2021   PLT 216.0 10/07/2021   Lab Results  Component Value Date   IRON 94  04/12/2021   FERRITIN 278.7 04/12/2021   Attestation Statements:   Reviewed by clinician on day of visit: allergies, medications, problem list, medical history, surgical history, family history, social history, and previous encounter notes.  Time spent on visit including pre-visit chart review and post-visit care and charting was 41 minutes.   I, Trixie Dredge, am acting as transcriptionist for Dennard Nip, MD.  I have reviewed the above documentation for accuracy and completeness, and I agree with the above. -  Dennard Nip, MD

## 2021-10-23 ENCOUNTER — Encounter (INDEPENDENT_AMBULATORY_CARE_PROVIDER_SITE_OTHER): Payer: Self-pay | Admitting: Family Medicine

## 2021-10-23 ENCOUNTER — Ambulatory Visit (INDEPENDENT_AMBULATORY_CARE_PROVIDER_SITE_OTHER): Payer: 59 | Admitting: Family Medicine

## 2021-10-23 VITALS — BP 108/70 | HR 80 | Temp 98.0°F | Ht 73.0 in | Wt 300.0 lb

## 2021-10-23 DIAGNOSIS — Z6839 Body mass index (BMI) 39.0-39.9, adult: Secondary | ICD-10-CM | POA: Diagnosis not present

## 2021-10-23 DIAGNOSIS — E1169 Type 2 diabetes mellitus with other specified complication: Secondary | ICD-10-CM

## 2021-10-23 DIAGNOSIS — E669 Obesity, unspecified: Secondary | ICD-10-CM

## 2021-10-23 DIAGNOSIS — Z7984 Long term (current) use of oral hypoglycemic drugs: Secondary | ICD-10-CM | POA: Diagnosis not present

## 2021-10-29 NOTE — Progress Notes (Signed)
Chief Complaint:   OBESITY Antonio Horton is here to discuss his progress with his obesity treatment plan along with follow-up of his obesity related diagnoses. Antonio Horton is on the Category 2 Plan and states he is following his eating plan approximately 95% of the time. Antonio Horton states he is doing 0 minutes 0 times per week.  Today's visit was #: 3 Starting weight: 313 lbs Starting date: 09/25/2021 Today's weight: 300 lbs Today's date: 10/23/2021 Total lbs lost to date: 13 Total lbs lost since last in-office visit: 6  Interim History: Antonio Horton continues to do well with weight loss.  His hunger is controlled and he is eating almost all of the food on his plan.  He is doing better with meal planning and prepping.  He is limited in exercise due to his aortic aneurysm.  Subjective:   1. Type 2 diabetes mellitus with other specified complication, without long-term current use of insulin (HCC) Antonio Horton continues to do well with his diet and weight loss.  His fasting blood sugars and 2-hour postprandial are at goal on metformin with no signs of hypoglycemia.  Assessment/Plan:   1. Type 2 diabetes mellitus with other specified complication, without long-term current use of insulin (HCC) Antonio Horton will continue his metformin and diet, and he will start to add an exercise.  2. Obesity, Current BMI 39.6 Antonio Horton is currently in the action stage of change. As such, his goal is to continue with weight loss efforts. He has agreed to the Category 4 Plan.   Exercise goals: Antonio Horton cushion core exercises were demonstrated today.   Behavioral modification strategies: meal planning and cooking strategies.  Antonio Horton has agreed to follow-up with our clinic in 2 weeks. He was informed of the importance of frequent follow-up visits to maximize his success with intensive lifestyle modifications for his multiple health conditions.   Objective:   Blood pressure 108/70, pulse 80,  temperature 98 F (36.7 C), height '6\' 1"'$  (1.854 m), weight 300 lb (136.1 kg), SpO2 96 %. Body mass index is 39.58 kg/m.  General: Cooperative, alert, well developed, in no acute distress. HEENT: Conjunctivae and lids unremarkable. Cardiovascular: Regular rhythm.  Lungs: Normal work of breathing. Neurologic: No focal deficits.   Lab Results  Component Value Date   CREATININE 1.14 10/07/2021   BUN 22 10/07/2021   NA 138 10/07/2021   K 4.3 10/07/2021   CL 101 10/07/2021   CO2 28 10/07/2021   Lab Results  Component Value Date   ALT 22 10/07/2021   AST 17 10/07/2021   ALKPHOS 43 10/07/2021   BILITOT 0.9 10/07/2021   Lab Results  Component Value Date   HGBA1C 7.4 (H) 09/25/2021   HGBA1C 7.1 (A) 09/10/2021   HGBA1C 7.2 (H) 03/13/2021   HGBA1C 6.6 (H) 11/25/2020   HGBA1C 5.4 03/28/2020   Lab Results  Component Value Date   INSULIN 41.9 (H) 09/25/2021   Lab Results  Component Value Date   TSH 1.350 09/25/2021   Lab Results  Component Value Date   CHOL 121 09/25/2021   HDL 43 09/25/2021   LDLCALC 57 09/25/2021   TRIG 114 09/25/2021   CHOLHDL 3 03/13/2021   Lab Results  Component Value Date   VD25OH 19.9 (L) 09/25/2021   Lab Results  Component Value Date   WBC 6.0 10/07/2021   HGB 15.1 10/07/2021   HCT 44.5 10/07/2021   MCV 89.5 10/07/2021   PLT 216.0 10/07/2021   Lab Results  Component Value Date   IRON  94 04/12/2021   FERRITIN 278.7 04/12/2021   Attestation Statements:   Reviewed by clinician on day of visit: allergies, medications, problem list, medical history, surgical history, family history, social history, and previous encounter notes.  Time spent on visit including pre-visit chart review and post-visit care and charting was 30 minutes.   I, Trixie Dredge, am acting as transcriptionist for Dennard Nip, MD.  I have reviewed the above documentation for accuracy and completeness, and I agree with the above. -  Dennard Nip, MD

## 2021-11-07 ENCOUNTER — Ambulatory Visit (INDEPENDENT_AMBULATORY_CARE_PROVIDER_SITE_OTHER): Payer: 59 | Admitting: Family Medicine

## 2021-11-07 ENCOUNTER — Encounter (INDEPENDENT_AMBULATORY_CARE_PROVIDER_SITE_OTHER): Payer: Self-pay | Admitting: Family Medicine

## 2021-11-07 VITALS — BP 112/76 | HR 73 | Temp 98.4°F | Ht 73.0 in | Wt 295.0 lb

## 2021-11-07 DIAGNOSIS — E669 Obesity, unspecified: Secondary | ICD-10-CM

## 2021-11-07 DIAGNOSIS — E1169 Type 2 diabetes mellitus with other specified complication: Secondary | ICD-10-CM | POA: Diagnosis not present

## 2021-11-07 DIAGNOSIS — Z6838 Body mass index (BMI) 38.0-38.9, adult: Secondary | ICD-10-CM | POA: Diagnosis not present

## 2021-11-07 DIAGNOSIS — E559 Vitamin D deficiency, unspecified: Secondary | ICD-10-CM

## 2021-11-07 DIAGNOSIS — Z7984 Long term (current) use of oral hypoglycemic drugs: Secondary | ICD-10-CM

## 2021-11-07 MED ORDER — VITAMIN D (ERGOCALCIFEROL) 1.25 MG (50000 UNIT) PO CAPS: 50000.0000 [IU] | ORAL_CAPSULE | ORAL | 0 refills | Status: DC

## 2021-11-07 NOTE — Progress Notes (Signed)
Office: [phone]  /  Fax: [phone]   Total lbs lost to date: 18 Total lbs lost since last in-office visit: 5     BP 112/76   Pulse 73   Temp 98.4 F (36.9 C)   Ht '6\' 1"'$  (1.854 m)   Wt 295 lb (133.8 kg)   SpO2 96%   BMI 38.92 kg/m  He was weighed on the bioimpedance scale:  Body mass index is 38.92 kg/m.  General:  Alert, oriented and cooperative. Patient is in no acute distress.  Mental Status: Normal mood and affect. Normal behavior. Normal judgment and thought content.        Patient past medical history includes:   Past Medical History:  Diagnosis Date   Borderline diabetes    Cholelithiases 11/25/2020   COVID-19 virus infection 06/14/2020   Diabetes mellitus without complication ([place])    Hyperlipidemia    Hypertension    Hypoxemia 11/25/2020   Sepsis ([place]) 11/25/2020   Sleep apnea    wears CPAP   Syncope 11/25/2020   Vasovagal reaction     History of Present Illness The patient presents with a history of obesity, diabetes, and hyperlipidemia, as well as vitamin D deficiency. They have recently lost 5 pounds and are seeking advice on meal choices, particularly for dinner. The patient has been using a glucose monitor and has noticed occasional drops in blood sugar levels during the night, with readings below 60. They report that their fasting glucose levels have improved, averaging around 100-105, and their post-dinner glucose levels range from 93 to 123. The patient is not experiencing hunger before bedtime and has not been taking any medications for blood sugar management.  The patient's vitamin D levels were measured at 19.9 a couple of weeks ago, indicating room for improvement. They are currently taking vitamin D supplements, which will be refilled, and their vitamin D levels will be rechecked after the first of the year. The patient has not reported any issues related to blood sugar levels, aside from the occasional nighttime drops.  In terms of meal  planning, the patient is seeking more variety for dinner options while maintaining their current progress in weight loss and blood sugar management. They have been advised to focus on meals with a calorie range of 500-700 and to pay attention to their protein intake. The patient has been encouraged to incorporate healthy fats, such as olive oil and avocado, into their meals to increase calorie intake without adding excessive volume.  The patient has been consistently losing weight, with a recent decrease from 7 pounds to 5 pounds lost in a two-week period. They are concerned about whether this change is due to not eating enough or if it is a sign of their metabolism slowing down. The patient has been reassured that their weight loss progress is still exceeding expectations and that there is no cause for concern at this point.  Assessment & Plan Weight loss: Patient has lost 5 pounds in the last two weeks, which is a positive outcome. However, there is a concern about potential metabolic slowdown due to low caloric intake. -Encouraged patient to maintain a minimum of 500 calories for dinner, aiming for around 700 calories an 50+ gm or protein -Provided patient with dinner options and recipes that meet these criteria.  Vitamin D deficiency: Last level was 19.9, indicating deficiency. -Refill Vitamin D supplement prescription. -Plan to recheck levels after the first of the year.  DM: Fasting glucose levels are averaging around 100-105, which  is within the target range. However, patient reports nocturnal hypoglycemia. -Encouraged patient to have a protein snack before bed to prevent nocturnal hypoglycemia. -Provided patient with a new blood sugar log to track any episodes of hypoglycemia.  Follow-up appointment scheduled for November 16th.  I have personally spent 45 minutes total time today in preparation, patient care, and documentation for this visit, including the following: review of clinical lab  tests; review of medical tests/procedures/services.     Dennard Nip, MD

## 2021-11-21 ENCOUNTER — Encounter (INDEPENDENT_AMBULATORY_CARE_PROVIDER_SITE_OTHER): Payer: Self-pay | Admitting: Family Medicine

## 2021-11-21 ENCOUNTER — Ambulatory Visit (INDEPENDENT_AMBULATORY_CARE_PROVIDER_SITE_OTHER): Payer: 59 | Admitting: Family Medicine

## 2021-11-21 VITALS — BP 105/67 | HR 69 | Temp 97.7°F | Ht 73.0 in | Wt 291.0 lb

## 2021-11-21 DIAGNOSIS — E669 Obesity, unspecified: Secondary | ICD-10-CM | POA: Diagnosis not present

## 2021-11-21 DIAGNOSIS — E559 Vitamin D deficiency, unspecified: Secondary | ICD-10-CM

## 2021-11-21 DIAGNOSIS — Z6838 Body mass index (BMI) 38.0-38.9, adult: Secondary | ICD-10-CM | POA: Diagnosis not present

## 2021-11-21 DIAGNOSIS — E1169 Type 2 diabetes mellitus with other specified complication: Secondary | ICD-10-CM

## 2021-11-21 DIAGNOSIS — Z7984 Long term (current) use of oral hypoglycemic drugs: Secondary | ICD-10-CM

## 2021-11-21 MED ORDER — VITAMIN D (ERGOCALCIFEROL) 1.25 MG (50000 UNIT) PO CAPS: 50000.0000 [IU] | ORAL_CAPSULE | ORAL | 0 refills | Status: DC

## 2021-11-26 ENCOUNTER — Other Ambulatory Visit: Payer: Self-pay | Admitting: Thoracic Surgery (Cardiothoracic Vascular Surgery)

## 2021-11-26 DIAGNOSIS — I7121 Aneurysm of the ascending aorta, without rupture: Secondary | ICD-10-CM

## 2021-12-02 ENCOUNTER — Telehealth: Payer: Self-pay | Admitting: Primary Care

## 2021-12-02 ENCOUNTER — Other Ambulatory Visit: Payer: Self-pay | Admitting: Primary Care

## 2021-12-02 NOTE — Progress Notes (Unsigned)
Chief Complaint:   OBESITY Antonio Horton is here to discuss his progress with his obesity treatment plan along with follow-up of his obesity related diagnoses. Antonio Horton is on the Category 4 Plan and keeping a food journal and adhering to recommended goals of 500-700 calories and 50+ grams of protein at dinner daily and states he is following his eating plan approximately 95% of the time. Antonio Horton states he is doing leg lifts and situps for 15-20 minutes 7 times per week.  Today's visit was #: 5 Starting weight: 313 lbs Starting date: 09/25/2021 Today's weight: 291 lbs Today's date: 11/21/2021 Total lbs lost to date: 22 Total lbs lost since last in-office visit: 4  Interim History: Antonio Horton has done well with weight loss. He is having difficulty with getting everything on Category 4 in. He denies hunger.   Subjective:   1. Type 2 diabetes mellitus with other specified complication, without long-term current use of insulin (HCC) Antonio Horton is taking metformin with no side effects noted. His last A1c was 7.4 and insulin 41.9. his CBG ranges between 100-120 and he had one low in the 60's, but then increased to 90 quick. Possible monitoring error. He is asymptomatic.   2. Vitamin D deficiency Antonio Horton is taking Vitamin D prescription, with no side effects.   Assessment/Plan:   1. Type 2 diabetes mellitus with other specified complication, without long-term current use of insulin (HCC) Antonio Horton will continue metformin, eating plan, and exercise. He will continue to monitor his BG and he was given a handout on hypoglycemia.   2. Vitamin D deficiency Antonio Horton will continue prescription Vitamin D 50,000 IU every week, and we will refill for 1 month. He will follow-up for routine testing of Vitamin D, at least 2-3 times per year to avoid over-replacement.  - Vitamin D, Ergocalciferol, (DRISDOL) 1.25 MG (50000 UNIT) CAPS capsule; Take 1 capsule (50,000 Units total) by mouth  every 7 (seven) days.  Dispense: 4 capsule; Refill: 0  3. Obesity, Current BMI 38.4 Antonio Horton is currently in the action stage of change. As such, his goal is to continue with weight loss efforts. He has agreed to the Category 4 Plan.   Exercise goals: As is.   Behavioral modification strategies: increasing lean protein intake, decreasing simple carbohydrates, meal planning and cooking strategies, and holiday eating strategies .  Antonio Horton has agreed to follow-up with our clinic in 3 weeks. He was informed of the importance of frequent follow-up visits to maximize his success with intensive lifestyle modifications for his multiple health conditions.   Objective:   Blood pressure 105/67, pulse 69, temperature 97.7 F (36.5 C), height '6\' 1"'$  (1.854 m), weight 291 lb (132 kg), SpO2 98 %. Body mass index is 38.39 kg/m.  General: Cooperative, alert, well developed, in no acute distress. HEENT: Conjunctivae and lids unremarkable. Cardiovascular: Regular rhythm.  Lungs: Normal work of breathing. Neurologic: No focal deficits.   Lab Results  Component Value Date   CREATININE 1.14 10/07/2021   BUN 22 10/07/2021   NA 138 10/07/2021   K 4.3 10/07/2021   CL 101 10/07/2021   CO2 28 10/07/2021   Lab Results  Component Value Date   ALT 22 10/07/2021   AST 17 10/07/2021   ALKPHOS 43 10/07/2021   BILITOT 0.9 10/07/2021   Lab Results  Component Value Date   HGBA1C 7.4 (H) 09/25/2021   HGBA1C 7.1 (A) 09/10/2021   HGBA1C 7.2 (H) 03/13/2021   HGBA1C 6.6 (H) 11/25/2020   HGBA1C 5.4 03/28/2020  Lab Results  Component Value Date   INSULIN 41.9 (H) 09/25/2021   Lab Results  Component Value Date   TSH 1.350 09/25/2021   Lab Results  Component Value Date   CHOL 121 09/25/2021   HDL 43 09/25/2021   LDLCALC 57 09/25/2021   TRIG 114 09/25/2021   CHOLHDL 3 03/13/2021   Lab Results  Component Value Date   VD25OH 19.9 (L) 09/25/2021   Lab Results  Component Value Date   WBC  6.0 10/07/2021   HGB 15.1 10/07/2021   HCT 44.5 10/07/2021   MCV 89.5 10/07/2021   PLT 216.0 10/07/2021   Lab Results  Component Value Date   IRON 94 04/12/2021   FERRITIN 278.7 04/12/2021   Attestation Statements:   Reviewed by clinician on day of visit: allergies, medications, problem list, medical history, surgical history, family history, social history, and previous encounter notes.   I, Trixie Dredge, am acting as transcriptionist for Dennard Nip, MD.  I have reviewed the above documentation for accuracy and completeness, and I agree with the above. -  Dennard Nip, MD

## 2021-12-02 NOTE — Telephone Encounter (Addendum)
Call patient:  He is due for repeat thyroid ultrasound in late November. Is he agreeable? [place] or [place]?

## 2021-12-03 NOTE — Telephone Encounter (Signed)
Noted, thanks!

## 2021-12-03 NOTE — Telephone Encounter (Signed)
Patient states he has an appointment with his new endocrinologist in January and believes he already has Thyroid ultrasound scheduled.

## 2021-12-05 ENCOUNTER — Ambulatory Visit (INDEPENDENT_AMBULATORY_CARE_PROVIDER_SITE_OTHER): Payer: 59 | Admitting: Family Medicine

## 2021-12-05 ENCOUNTER — Encounter (INDEPENDENT_AMBULATORY_CARE_PROVIDER_SITE_OTHER): Payer: Self-pay | Admitting: Family Medicine

## 2021-12-05 VITALS — BP 111/71 | HR 79 | Temp 97.9°F | Ht 73.0 in | Wt 287.0 lb

## 2021-12-05 DIAGNOSIS — E559 Vitamin D deficiency, unspecified: Secondary | ICD-10-CM

## 2021-12-05 DIAGNOSIS — E669 Obesity, unspecified: Secondary | ICD-10-CM

## 2021-12-05 DIAGNOSIS — I10 Essential (primary) hypertension: Secondary | ICD-10-CM

## 2021-12-05 DIAGNOSIS — E1169 Type 2 diabetes mellitus with other specified complication: Secondary | ICD-10-CM

## 2021-12-05 DIAGNOSIS — Z6837 Body mass index (BMI) 37.0-37.9, adult: Secondary | ICD-10-CM

## 2021-12-05 DIAGNOSIS — Z7985 Long-term (current) use of injectable non-insulin antidiabetic drugs: Secondary | ICD-10-CM

## 2021-12-09 ENCOUNTER — Other Ambulatory Visit: Payer: Self-pay | Admitting: Primary Care

## 2021-12-09 DIAGNOSIS — E119 Type 2 diabetes mellitus without complications: Secondary | ICD-10-CM

## 2021-12-17 MED ORDER — VITAMIN D (ERGOCALCIFEROL) 1.25 MG (50000 UNIT) PO CAPS: 50000.0000 [IU] | ORAL_CAPSULE | ORAL | 0 refills | Status: DC

## 2021-12-17 NOTE — Progress Notes (Unsigned)
Chief Complaint:   OBESITY Antonio Horton is here to discuss his progress with his obesity treatment plan along with follow-up of his obesity related diagnoses. Antonio Horton is on the Category 4 Plan and states he is following his eating plan approximately 90-95% of the time. Antonio Horton states he is doing leg lifts and situps for 20 minutes 7 times per week.  Today's visit was #: 6 Starting weight: 313 lbs Starting date: 09/25/2021 Today's weight: 287 lbs Today's date: 12/05/2021 Total lbs lost to date: 26 Total lbs lost since last in-office visit: 4  Interim History: Antonio Horton has done well with weight loss.  His hunger is not an issue, and he is learning to adapt to recipes to meet his protein goals.  Subjective:   1. Type 2 diabetes mellitus with other specified complication, without long-term current use of insulin (HCC) Antonio Horton is taking metformin with no side effects.  His fasting blood sugars range between 90's-110's.  He is working on his healthy eating plan and exercise.  2. Vitamin D deficiency Antonio Horton is taking vitamin D prescription with no side effects noted.  3. Essential hypertension Antonio Horton is taking Zestoretic 10-12.5 mg daily.  He is hydrating well and his blood pressure is at goal, but he feels a little lightheaded at times but also feeling a little tired from a stressful week.  Assessment/Plan:   1. Type 2 diabetes mellitus with other specified complication, without long-term current use of insulin (HCC) Antonio Horton will continue with his metformin, diet, and exercise.  2. Vitamin D deficiency Antonio Horton will continue prescription vitamin D 50,000 IU every week #4, and we will refill for 1 month.  3. Essential hypertension Antonio Horton will continue his diet, exercise, and may need to decrease to 1/2 tablet daily.  We will continue to monitor.  4. Obesity, Current BMI 37.9 Antonio Horton is currently in the action stage of change. As such, his  goal is to continue with weight loss efforts. He has agreed to the Category 4 Plan.   Exercise goals: As is.   Behavioral modification strategies: increasing lean protein intake, increasing water intake, and holiday eating strategies .  Antonio Horton has agreed to follow-up with our clinic in 3 to 4 weeks. He was informed of the importance of frequent follow-up visits to maximize his success with intensive lifestyle modifications for his multiple health conditions.   Objective:   Blood pressure 111/71, pulse 79, temperature 97.9 F (36.6 C), height '6\' 1"'$  (1.854 m), weight 287 lb (130.2 kg), SpO2 96 %. Body mass index is 37.87 kg/m.  General: Cooperative, alert, well developed, in no acute distress. HEENT: Conjunctivae and lids unremarkable. Cardiovascular: Regular rhythm.  Lungs: Normal work of breathing. Neurologic: No focal deficits.   Lab Results  Component Value Date   CREATININE 1.14 10/07/2021   BUN 22 10/07/2021   NA 138 10/07/2021   K 4.3 10/07/2021   CL 101 10/07/2021   CO2 28 10/07/2021   Lab Results  Component Value Date   ALT 22 10/07/2021   AST 17 10/07/2021   ALKPHOS 43 10/07/2021   BILITOT 0.9 10/07/2021   Lab Results  Component Value Date   HGBA1C 7.4 (H) 09/25/2021   HGBA1C 7.1 (A) 09/10/2021   HGBA1C 7.2 (H) 03/13/2021   HGBA1C 6.6 (H) 11/25/2020   HGBA1C 5.4 03/28/2020   Lab Results  Component Value Date   INSULIN 41.9 (H) 09/25/2021   Lab Results  Component Value Date   TSH 1.350 09/25/2021   Lab Results  Component Value Date   CHOL 121 09/25/2021   HDL 43 09/25/2021   LDLCALC 57 09/25/2021   TRIG 114 09/25/2021   CHOLHDL 3 03/13/2021   Lab Results  Component Value Date   VD25OH 19.9 (L) 09/25/2021   Lab Results  Component Value Date   WBC 6.0 10/07/2021   HGB 15.1 10/07/2021   HCT 44.5 10/07/2021   MCV 89.5 10/07/2021   PLT 216.0 10/07/2021   Lab Results  Component Value Date   IRON 94 04/12/2021   FERRITIN 278.7  04/12/2021   Attestation Statements:   Reviewed by clinician on day of visit: allergies, medications, problem list, medical history, surgical history, family history, social history, and previous encounter notes.   I, Trixie Dredge, am acting as transcriptionist for Dennard Nip, MD.  I have reviewed the above documentation for accuracy and completeness, and I agree with the above. -  Dennard Nip, MD

## 2021-12-26 ENCOUNTER — Ambulatory Visit (INDEPENDENT_AMBULATORY_CARE_PROVIDER_SITE_OTHER): Payer: 59 | Admitting: Family Medicine

## 2021-12-26 ENCOUNTER — Other Ambulatory Visit: Payer: Self-pay

## 2021-12-26 ENCOUNTER — Encounter (INDEPENDENT_AMBULATORY_CARE_PROVIDER_SITE_OTHER): Payer: Self-pay | Admitting: Family Medicine

## 2021-12-26 VITALS — BP 98/60 | HR 67 | Temp 97.8°F | Ht 73.0 in | Wt 283.0 lb

## 2021-12-26 DIAGNOSIS — Z6837 Body mass index (BMI) 37.0-37.9, adult: Secondary | ICD-10-CM

## 2021-12-26 DIAGNOSIS — E1169 Type 2 diabetes mellitus with other specified complication: Secondary | ICD-10-CM

## 2021-12-26 DIAGNOSIS — E559 Vitamin D deficiency, unspecified: Secondary | ICD-10-CM

## 2021-12-26 DIAGNOSIS — E669 Obesity, unspecified: Secondary | ICD-10-CM | POA: Diagnosis not present

## 2021-12-26 DIAGNOSIS — I10 Essential (primary) hypertension: Secondary | ICD-10-CM

## 2021-12-26 MED ORDER — VITAMIN D (ERGOCALCIFEROL) 1.25 MG (50000 UNIT) PO CAPS: 50000.0000 [IU] | ORAL_CAPSULE | ORAL | 0 refills | Status: DC

## 2021-12-30 ENCOUNTER — Ambulatory Visit: Payer: Self-pay | Admitting: Endocrinology

## 2022-01-06 NOTE — Progress Notes (Unsigned)
Chief Complaint:   OBESITY Antonio Horton is here to discuss his progress with his obesity treatment plan along with follow-up of his obesity related diagnoses. Antonio Horton is on the Category 4 Plan and states he is following his eating plan approximately 80% of the time. Antonio Horton states he is doing leg lifts and situps for 20 minutes 5 times per week.  Today's visit was #: 7 Starting weight: 313 lbs Starting date: 09/25/2021 Today's weight: 283 lbs Today's date: 12/26/2021 Total lbs lost to date: 30 Total lbs lost since last in-office visit: 4  Interim History: Antonio Horton continues to do well with his weight loss.  His hunger is controlled and he is making healthy food choices.  He feels he will do well with eating and exercise over Christmas.  Subjective:   1. Essential hypertension Antonio Horton's blood pressure is well-controlled, and he has no signs of hypotension.  He is following cardiology for his AAA and he has a CT next month.  2. Vitamin D deficiency Antonio Horton is stable on his vitamin D, and his last level was not at goal.  3. Type 2 diabetes mellitus with other specified complication, without long-term current use of insulin (Antonio Horton) Antonio Horton states his fasting blood sugars are mostly controlled, with the highest postprandial glucose in 140s.  Assessment/Plan:   1. Essential hypertension No change in medication at this time.  Antonio Horton is to work on increasing his water intake and monitor for hypotension.  2. Vitamin D deficiency We will refill prescription vitamin D for 1 month, and we will recheck labs in 1 to 2 months.  - Vitamin D, Ergocalciferol, (DRISDOL) 1.25 MG (50000 UNIT) CAPS capsule; Take 1 capsule (50,000 Units total) by mouth every 7 (seven) days.  Dispense: 4 capsule; Refill: 0  3. Type 2 diabetes mellitus with other specified complication, without long-term current use of insulin (HCC) Antonio Horton will continue with his diet, exercise, and  weight loss.  We will recheck labs in 1 to 2 months.  4. Obesity, Current BMI 37.4 Antonio Horton is currently in the action stage of change. As such, his goal is to continue with weight loss efforts. He has agreed to the Category 4 Plan.   Exercise goals: As is.   Behavioral modification strategies: no skipping meals and holiday eating strategies .  Antonio Horton has agreed to follow-up with our clinic in 3 to 4 weeks. He was informed of the importance of frequent follow-up visits to maximize his success with intensive lifestyle modifications for his multiple health conditions.   Objective:   Blood pressure 98/60, pulse 67, temperature 97.8 F (36.6 C), height '6\' 1"'$  (1.854 m), weight 283 lb (128.4 kg), SpO2 98 %. Body mass index is 37.34 kg/m.  General: Cooperative, alert, well developed, in no acute distress. HEENT: Conjunctivae and lids unremarkable. Cardiovascular: Regular rhythm.  Lungs: Normal work of breathing. Neurologic: No focal deficits.   Lab Results  Component Value Date   CREATININE 1.14 10/07/2021   BUN 22 10/07/2021   NA 138 10/07/2021   K 4.3 10/07/2021   CL 101 10/07/2021   CO2 28 10/07/2021   Lab Results  Component Value Date   ALT 22 10/07/2021   AST 17 10/07/2021   ALKPHOS 43 10/07/2021   BILITOT 0.9 10/07/2021   Lab Results  Component Value Date   HGBA1C 7.4 (H) 09/25/2021   HGBA1C 7.1 (A) 09/10/2021   HGBA1C 7.2 (H) 03/13/2021   HGBA1C 6.6 (H) 11/25/2020   HGBA1C 5.4 03/28/2020   Lab  Results  Component Value Date   INSULIN 41.9 (H) 09/25/2021   Lab Results  Component Value Date   TSH 1.350 09/25/2021   Lab Results  Component Value Date   CHOL 121 09/25/2021   HDL 43 09/25/2021   LDLCALC 57 09/25/2021   TRIG 114 09/25/2021   CHOLHDL 3 03/13/2021   Lab Results  Component Value Date   VD25OH 19.9 (L) 09/25/2021   Lab Results  Component Value Date   WBC 6.0 10/07/2021   HGB 15.1 10/07/2021   HCT 44.5 10/07/2021   MCV 89.5  10/07/2021   PLT 216.0 10/07/2021   Lab Results  Component Value Date   IRON 94 04/12/2021   FERRITIN 278.7 04/12/2021   Attestation Statements:   Reviewed by clinician on day of visit: allergies, medications, problem list, medical history, surgical history, family history, social history, and previous encounter notes.   I, Trixie Dredge, am acting as transcriptionist for Dennard Nip, MD.  I have reviewed the above documentation for accuracy and completeness, and I agree with the above. -  Dennard Nip, MD

## 2022-01-23 ENCOUNTER — Ambulatory Visit
Admission: RE | Admit: 2022-01-23 | Discharge: 2022-01-23 | Disposition: A | Discharge status: Home / Self Care | Payer: 59 | Source: Ambulatory Visit | Attending: Thoracic Surgery (Cardiothoracic Vascular Surgery) | Admitting: Thoracic Surgery (Cardiothoracic Vascular Surgery)

## 2022-01-23 DIAGNOSIS — I7121 Aneurysm of the ascending aorta, without rupture: Secondary | ICD-10-CM

## 2022-01-23 MED ORDER — IOPAMIDOL (ISOVUE-370) INJECTION 76%
75.0000 mL | Freq: Once | INTRAVENOUS | Status: AC | PRN
  Administered 2022-01-23: 75 mL via INTRAVENOUS

## 2022-01-26 IMAGING — US US THYROID
1 series · 12 of 25 positions shown · non-contrast
Comparison: CT chest November 2020

CLINICAL DATA: Incidental on CT.

EXAM:
THYROID ULTRASOUND
TECHNIQUE: Ultrasound examination of the thyroid gland and adjacent soft
tissues was performed.

[Series 1: us thyroid · 0.06mm/px · 12 of 71 slices shown]
[im 3/71]
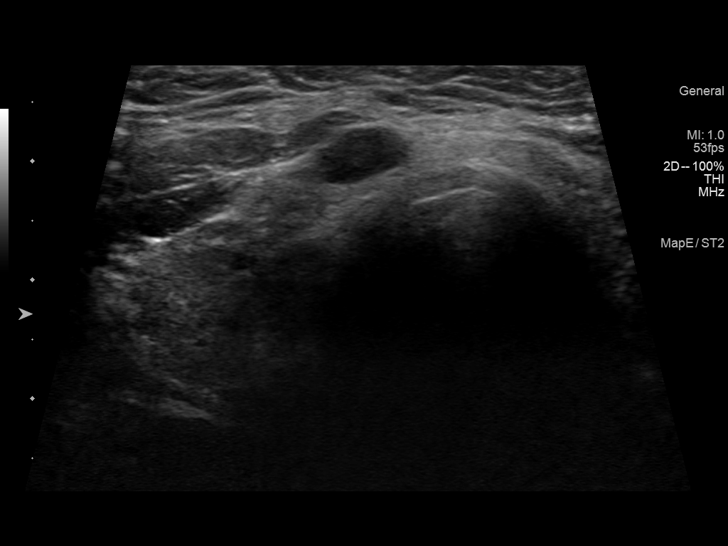
[im 9/71]
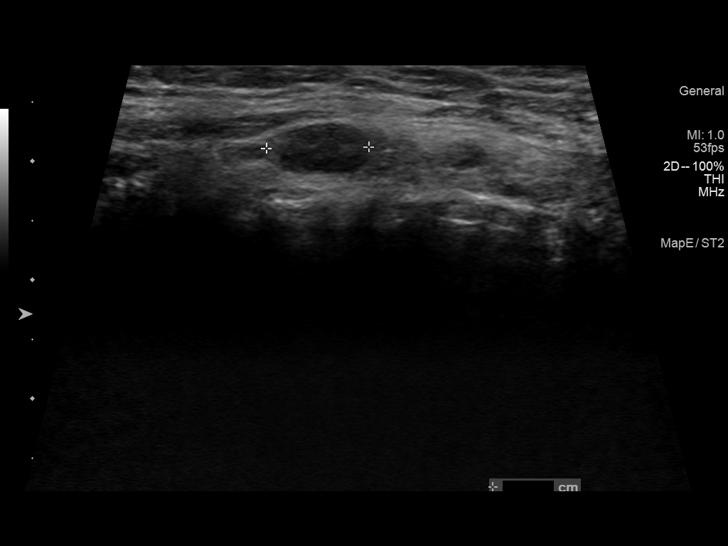
[im 15/71]
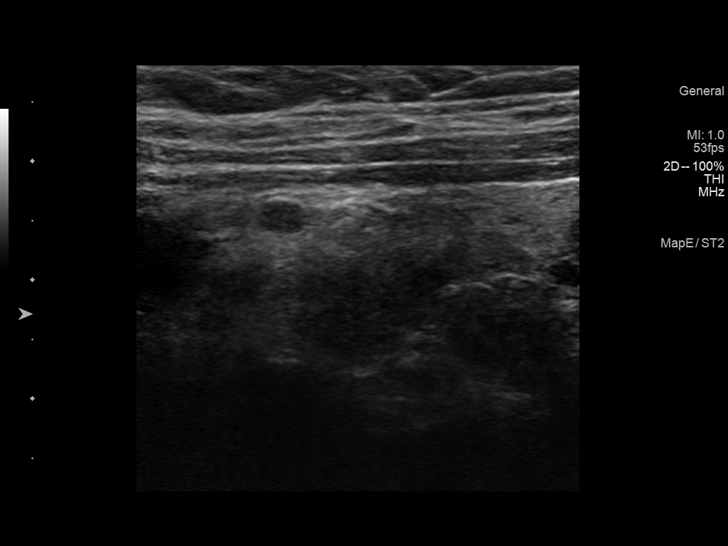
[im 21/71]
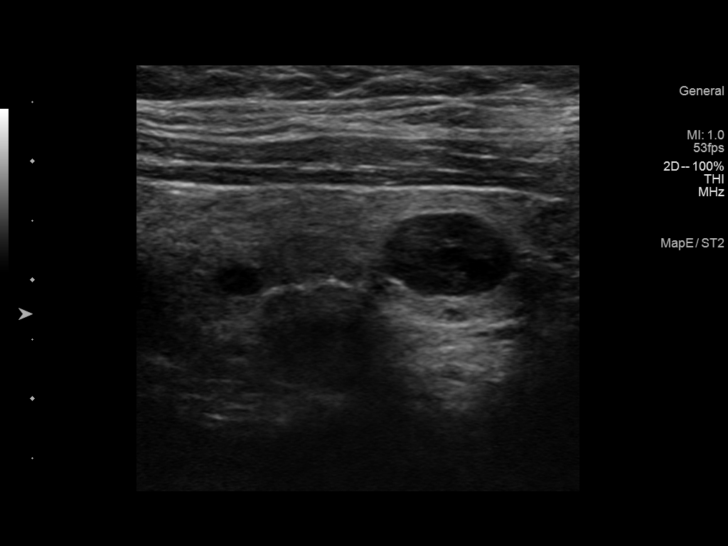
[im 27/71]
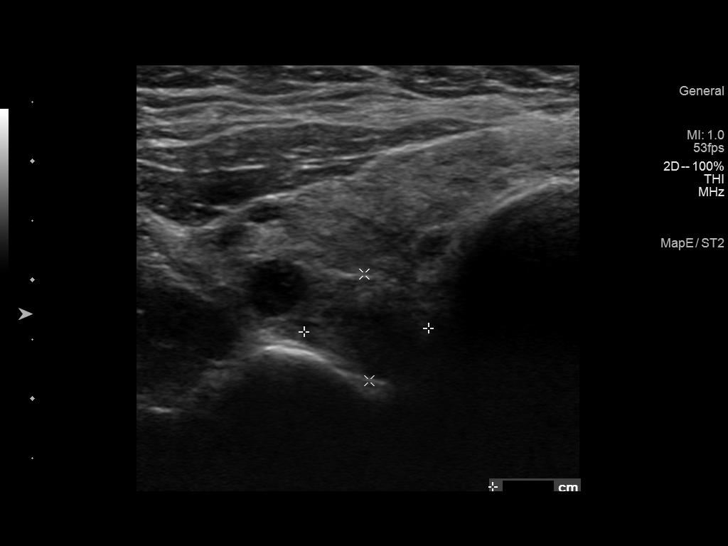
[im 33/71]
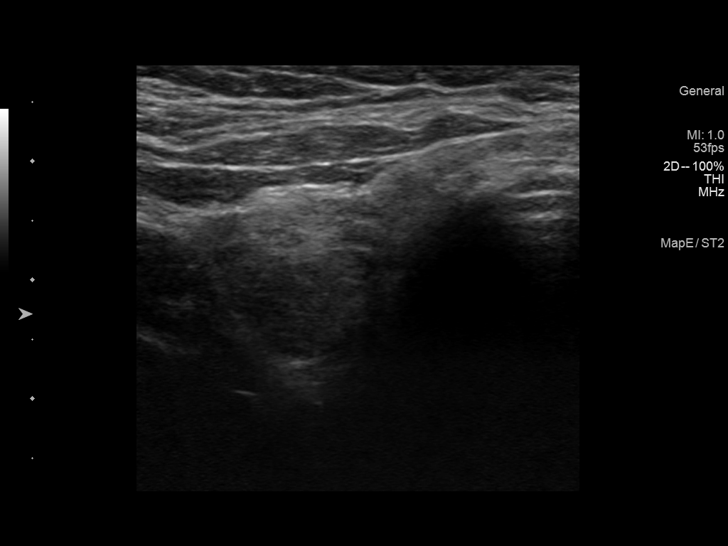
[im 38/71]
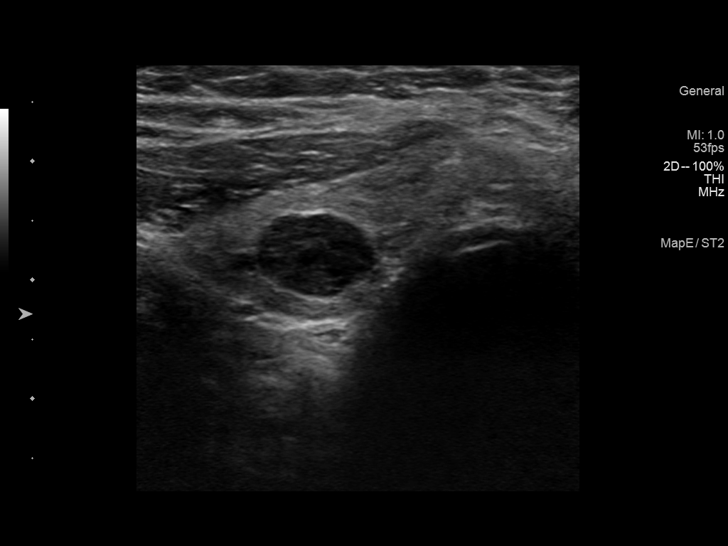
[im 44/71]
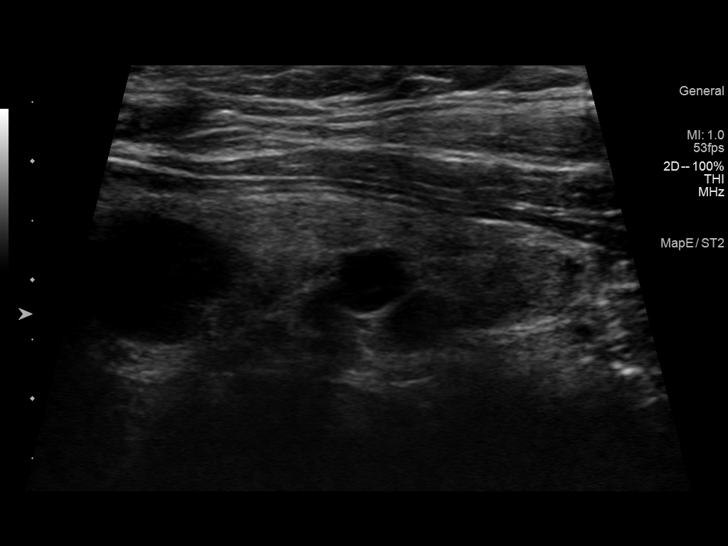
[im 50/71]
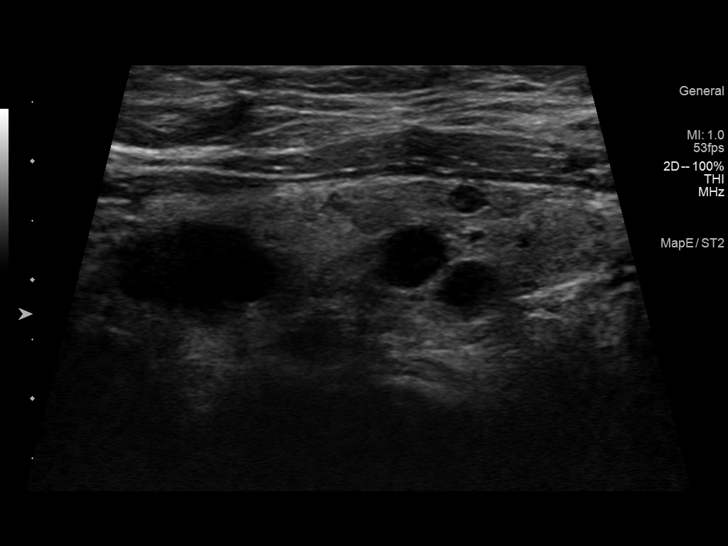
[im 56/71]
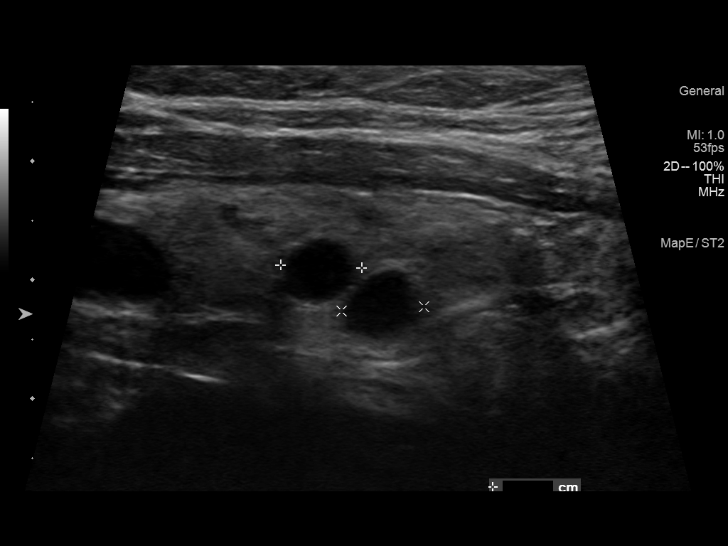
[im 62/71]
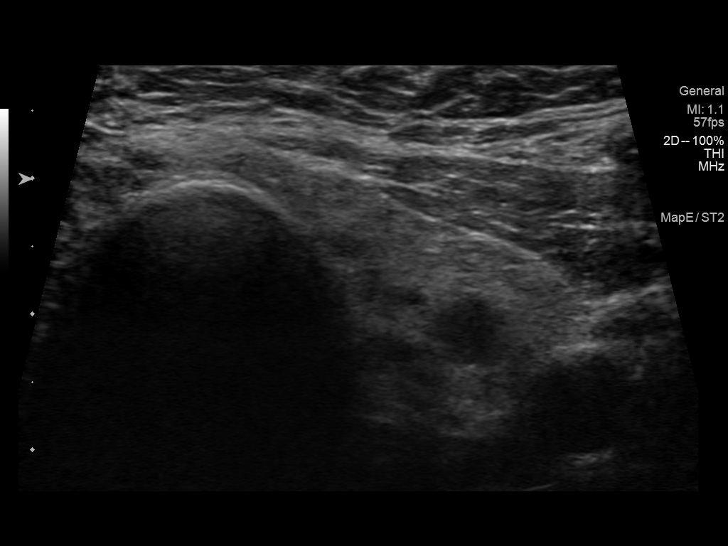
[im 68/71]
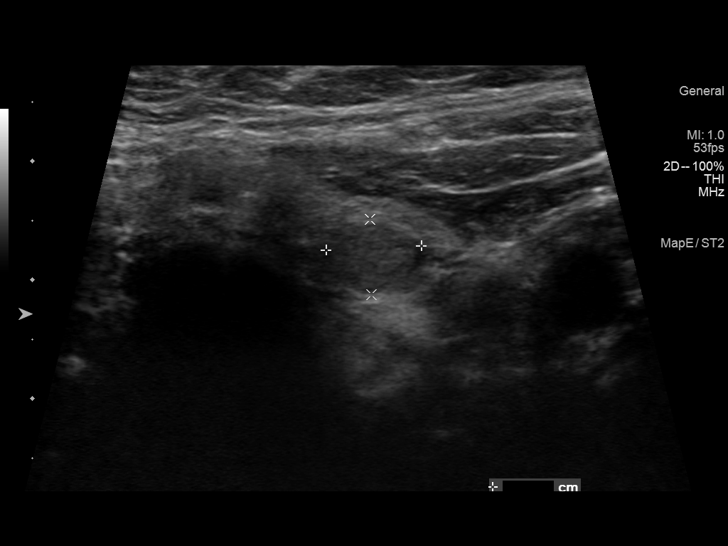

[12 of 25 positions shown; findings below may reference images not displayed]

FINDINGS: Parenchymal Echotexture: Moderately heterogenous

Isthmus: 0.4 cm

Right lobe: 5.4 x 1.9 x 2.1 cm

Left lobe: 4.7 x 1.7 x 1.8 cm

_________________________________________________________

Estimated total number of nodules >/= 1 cm: 6

Number of spongiform nodules >/=  2 cm not described below (TR1): 0

Number of mixed cystic and solid nodules >/= 1.5 cm not described
below (TR2): 0

_________________________________________________________

Nodule labeled 1 is a solid hypoechoic nodule (TR 4) in the thyroid
isthmus measuring up to 0.9 cm. Given size (<0.9 cm) and appearance,
this nodule does NOT meet TI-RADS criteria for biopsy or dedicated
follow-up.

Nodule labeled 2 is a solid hypoechoic nodule (TR 4) in the thyroid
isthmus measuring up to 0.9 cm. Given size (<0.9 cm) and appearance,
this nodule does NOT meet TI-RADS criteria for biopsy or dedicated
follow-up.

Nodule labeled 3 is a solid hypoechoic nodule (TR 4) in the superior
right thyroid lobe measuring up to 1.2 cm. *Given size (>/= 1 -
cm) and appearance, a follow-up ultrasound in 1 year should be
considered based on TI-RADS criteria.

Nodule labeled 4 is a solid hypoechoic nodule (TR 4) in the mid
right thyroid lobe measuring up to 1.2 cm. *Given size (>/= 1 -
cm) and appearance, a follow-up ultrasound in 1 year should be
considered based on TI-RADS criteria.

Nodule labeled 5 is a solid hypoechoic nodule (TR 4) in the inferior
right thyroid lobe measuring up to 1.1 cm. *Given size (>/= 1 -
cm) and appearance, a follow-up ultrasound in 1 year should be
considered based on TI-RADS criteria.

Nodule labeled 6 is a solid more isoechoic nodule (TR 3) in the
inferior right thyroid lobe measuring up to 1.1 cm. Given size (<1.4
cm) and appearance, this nodule does NOT meet TI-RADS criteria for
biopsy or dedicated follow-up.

Nodule labeled 7 is a solid hypoechoic nodule (TR 4) in the superior
left thyroid lobe measuring 1.5 cm. **Given size (>/= 1.5 cm) and
appearance, fine needle aspiration of this moderately suspicious
nodule should be considered based on TI-RADS criteria.

Nodules labeled 8 in 9 are small subcentimeter solid hypoechoic
nodules (TR 4) in the mid left thyroid lobe measuring no more than
0.8 cm. Given size (<0.9 cm) and appearance, this nodule does NOT
meet TI-RADS criteria for biopsy or dedicated follow-up.

Nodule labeled 10 is a solid more isoechoic nodule (TR 3) in the
inferior left thyroid lobe measuring up to 1.5 cm. *Given size (>/=
1.5 - 2.4 cm) and appearance, a follow-up ultrasound in 1 year
should be considered based on TI-RADS criteria.
IMPRESSION: 1. Multinodular thyroid gland.
2. Nodule labeled 7 in the superior left thyroid lobe meets criteria
for biopsy.
3. Bilateral nodules labeled 3, 4, 5 and 10 meet criteria for
follow-up ultrasound in 1 year.

The above is in keeping with the ACR TI-RADS recommendations - [HOSPITAL] 8975;[DATE].

## 2022-01-28 ENCOUNTER — Ambulatory Visit (INDEPENDENT_AMBULATORY_CARE_PROVIDER_SITE_OTHER): Payer: 59 | Admitting: Thoracic Surgery (Cardiothoracic Vascular Surgery)

## 2022-01-28 ENCOUNTER — Encounter: Payer: Self-pay | Admitting: Thoracic Surgery (Cardiothoracic Vascular Surgery)

## 2022-01-28 VITALS — BP 134/85 | HR 88 | Resp 20 | Ht 73.0 in | Wt 289.0 lb

## 2022-01-28 DIAGNOSIS — I7121 Aneurysm of the ascending aorta, without rupture: Secondary | ICD-10-CM

## 2022-01-28 NOTE — Progress Notes (Signed)
[facility] [address] [facility]       [place] [address]             [phone]     HPI: Mr. Adamcik returns for a scheduled follow-up visit regarding his ascending aneurysm.  Iman Orourke is a 57 year old man with a history of morbid obesity, hypertension, hyperlipidemia, sleep apnea, type 2 diabetes without complication, vasovagal syncope, and ascending aortic aneurysm.  He presented with syncopal episodes in November 2022.  Workup included a CT which showed a 5.2 cm ascending aneurysm.  In the interim since his last visit in June 2023 he has been feeling well.  He has lost about 25 to 30 pounds.  He has been very conscious of his diet and exercise.  He did gain a few pounds back over Christmas.  No chest pain, pressure, tightness, unusual shortness of breath with exertion.  Past Medical History:  Diagnosis Date   Borderline diabetes    Cholelithiases 11/25/2020   COVID-19 virus infection 06/14/2020   Diabetes mellitus without complication ([place])    Hyperlipidemia    Hypertension    Hypoxemia 11/25/2020   Sepsis ([place]) 11/25/2020   Sleep apnea    wears CPAP   Syncope 11/25/2020   Vasovagal reaction     Current Outpatient Medications  Medication Sig Dispense Refill   Continuous Blood Gluc Sensor (FREESTYLE LIBRE 3 SENSOR) MISC Place 1 sensor on the skin every 14 days. Use to check glucose continuously 6 each 1   lisinopril-hydrochlorothiazide (ZESTORETIC) 10-12.5 MG tablet TAKE 1 TABLET BY MOUTH DAILY FOR BLOOD PRESSURE 90 tablet 1   metFORMIN (GLUCOPHAGE-XR) 500 MG 24 hr tablet TAKE 2 TABLETS (1,000 MG TOTAL) BY MOUTH DAILY WITH BREAKFAST. FOR DIABETES. 180 tablet 0   prednisoLONE acetate (PRED FORTE) 1 % ophthalmic suspension Place 1 drop into the left eye daily.      rosuvastatin (CRESTOR) 5 MG tablet Take 1 tablet (5 mg total) by mouth daily. For cholesterol. 90 tablet 3   Vitamin D, Ergocalciferol, (DRISDOL) 1.25 MG (50000 UNIT) CAPS capsule Take 1 capsule (50,000  Units total) by mouth every 7 (seven) days. 4 capsule 0   No current facility-administered medications for this visit.    Physical Exam BP 134/85 (BP Location: Left Arm, Patient Position: Sitting)   Pulse 88   Resp 20   Ht '6\' 1"'$  (1.854 m)   Wt 289 lb (131.1 kg)   SpO2 97% Comment: RA  BMI 38.54 kg/m  57 year old man in no acute distress Alert and oriented x 3 with no focal deficits Lungs clear with equal breath sounds bilaterally Cardiac regular rate and rhythm with normal S1 and S2 with no rubs, murmurs or gallops No carotid bruits No peripheral edema  Diagnostic Tests: CT ANGIOGRAPHY CHEST WITH CONTRAST   TECHNIQUE: Multidetector CT imaging of the chest was performed using the standard protocol during bolus administration of intravenous contrast. Multiplanar CT image reconstructions and MIPs were obtained to evaluate the vascular anatomy.   RADIATION DOSE REDUCTION: This exam was performed according to the departmental dose-optimization program which includes automated exposure control, adjustment of the mA and/or kV according to patient size and/or use of iterative reconstruction technique.   CONTRAST:  38m ISOVUE-370 IOPAMIDOL (ISOVUE-370) INJECTION 76%   COMPARISON:  CT 07/04/2021   FINDINGS: Cardiovascular: Ascending thoracic aorta measures 4.9 x 5.0 cm at the level the pulmonary outflow tract (image 41/CT series 4/axial plane) which compares to 5.1 x 5.0 cm on comparison CT  at same level (07/07/2021).   In reformatted orthogonal sagittal plane, ascending thoracic aorta measures 4.6 cm also unchanged from 4.6 cm on comparison exam reformatted.   Mediastinum/Nodes: No axillary or supraclavicular adenopathy. No mediastinal or hilar adenopathy. No pericardial fluid. Esophagus normal.   Lungs/Pleura: No suspicious pulmonary nodules. Normal pleural. Airways normal.   Upper Abdomen: Limited view of the liver, kidneys, pancreas are unremarkable. Normal adrenal  glands. Benign liver cyst again demonstrated.   Musculoskeletal: No aggressive osseous lesion.   Review of the MIP images confirms the above findings.   IMPRESSION: No interval change in ascending thoracic aortic aneurysm as described above (greatest axial dimension 5.0 cm). Recommend semi-annual imaging followup by CTA or MRA and referral to cardiothoracic surgery if not already obtained. This recommendation follows 2010 ACCF/AHA/AATS/ACR/ASA/SCA/SCAI/SIR/STS/SVM Guidelines for the Diagnosis and Management of Patients With Thoracic Aortic Disease. Circulation. 2010; 121: K354-S568. Aortic aneurysm NOS (ICD10-I71.9)     Electronically Signed   By: Suzy Bouchard M.D.   On: 01/23/2022 12:49 I personally reviewed the CT images.  There is a 5 cm ascending aneurysm that comes back to normal size around the takeoff of the innominate.  No change from prior scans.  Impression: Jaylin Benzel is a 57 year old man with a history of morbid obesity, hypertension, hyperlipidemia, sleep apnea, type 2 diabetes without complication, vasovagal syncope, and ascending aortic aneurysm.  Ascending thoracic aortic aneurysm-stable at 5 cm.  No indication for surgery.  We discussed that symptoms only occur with something catastrophic, so continued close follow-up is essential.  Will continue with semiannual CT angiogram.  Hypertension-blood pressure very minimally elevated today.  He will monitor at home.  Morbid obesity-he has lost a significant amount of weight.  I congratulated him for that and emphasized the importance of continued healthy lifestyle.  Plan: Return in 6 months with CT angio of chest  Melrose Nakayama, MD [facility] [phone]

## 2022-01-30 ENCOUNTER — Encounter (INDEPENDENT_AMBULATORY_CARE_PROVIDER_SITE_OTHER): Payer: Self-pay | Admitting: Family Medicine

## 2022-01-30 ENCOUNTER — Ambulatory Visit (INDEPENDENT_AMBULATORY_CARE_PROVIDER_SITE_OTHER): Payer: 59 | Admitting: Family Medicine

## 2022-01-30 VITALS — BP 114/77 | HR 65 | Temp 98.2°F | Ht 73.0 in | Wt 284.2 lb

## 2022-01-30 DIAGNOSIS — Z6837 Body mass index (BMI) 37.0-37.9, adult: Secondary | ICD-10-CM | POA: Diagnosis not present

## 2022-01-30 DIAGNOSIS — E669 Obesity, unspecified: Secondary | ICD-10-CM

## 2022-01-30 DIAGNOSIS — E559 Vitamin D deficiency, unspecified: Secondary | ICD-10-CM

## 2022-02-05 NOTE — Progress Notes (Deleted)
Tried to call patient to let him know not to come to his lab appointment per Dr. Dwyane Dee he's all up date.  Voicemail has not been set up.  Also tried to call wife's cell phone number voicemail has not been set up.

## 2022-02-07 ENCOUNTER — Other Ambulatory Visit: Payer: Self-pay

## 2022-02-09 IMAGING — US US FNA BIOPSY THYROID 1ST LESION
1 series · 13 of 14 positions shown · non-contrast
Comparison: Prior thyroid ultrasound 12/18/2020

MEDICATIONS:
None

COMPLICATIONS:
None immediate.

INDICATION: Indeterminate thyroid nodule

EXAM:
ULTRASOUND GUIDED FINE NEEDLE ASPIRATION OF INDETERMINATE THYROID
NODULE
TECHNIQUE: Informed written consent was obtained from the patient after a
discussion of the risks, benefits and alternatives to treatment.
Questions regarding the procedure were encouraged and answered. A
timeout was performed prior to the initiation of the procedure.

[Series 1: us fna biopsy thyroid 1st lesion · 0.06mm/px · 14 acquisitions, 13 frames shown]
[im 1/14]
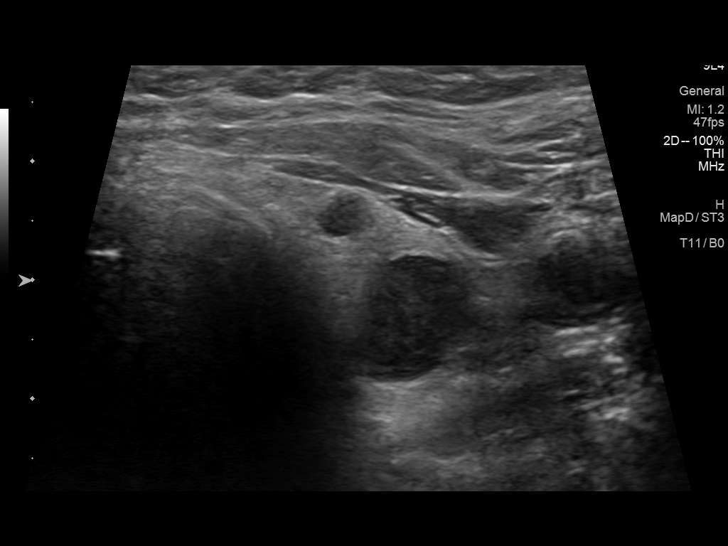
[im 2/14]
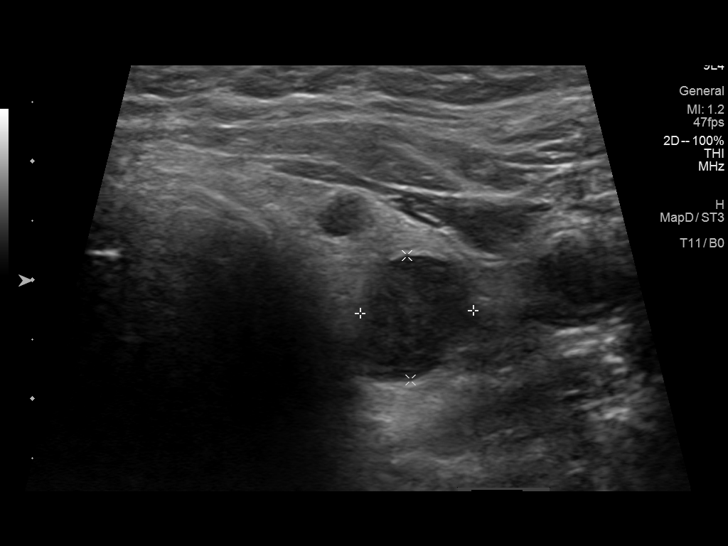
[im 3/14]
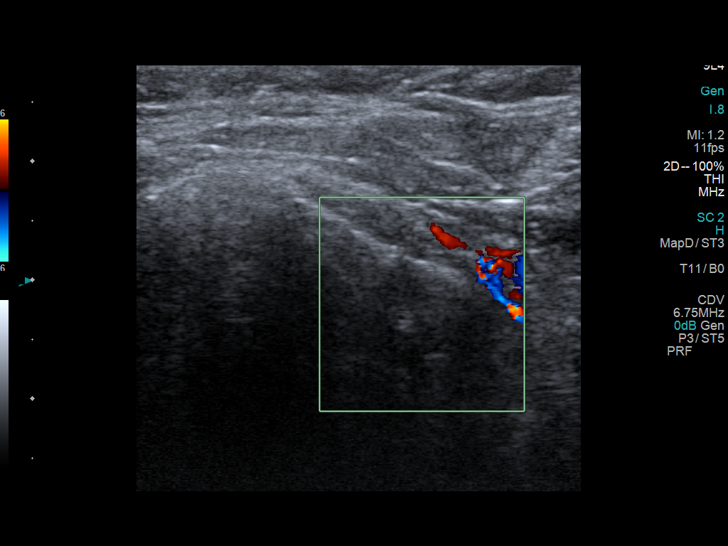
[im 4/14]
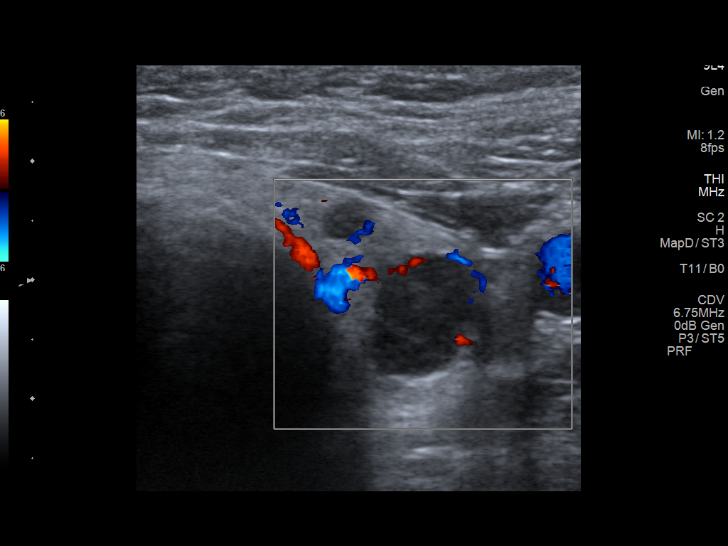
[im 5/14]
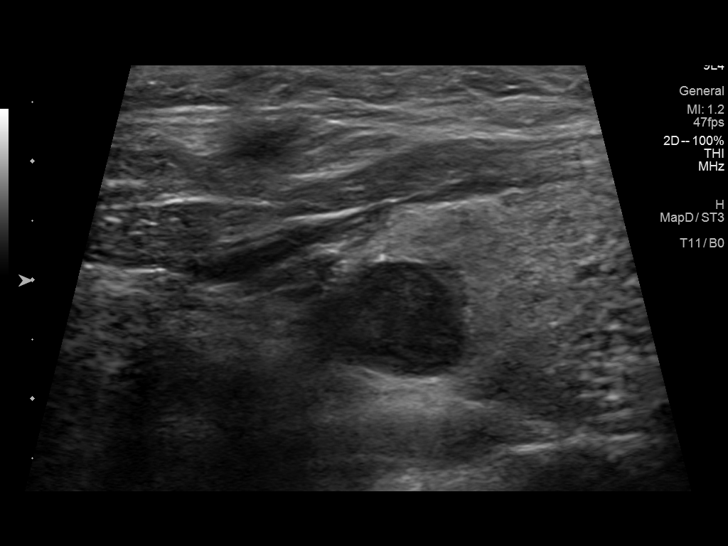
[im 6/14]
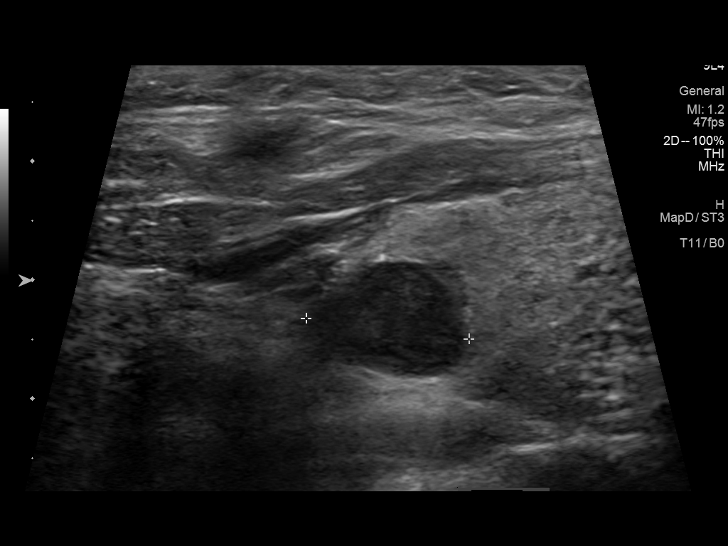
[im 8/14]
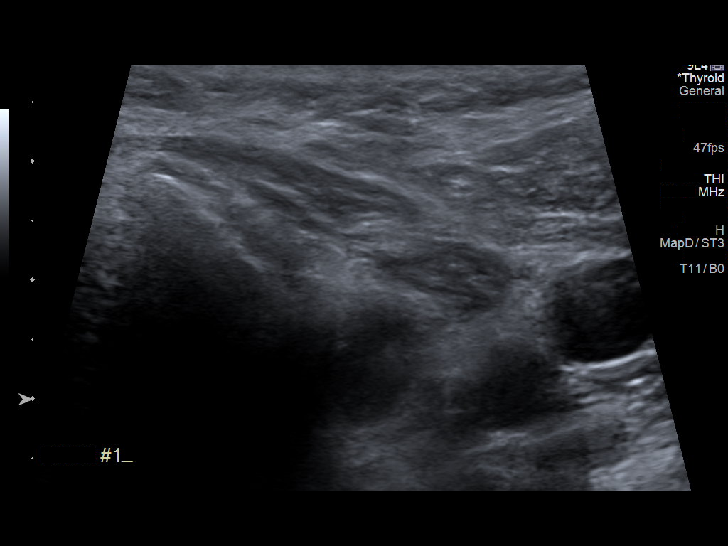
[im 9/14]
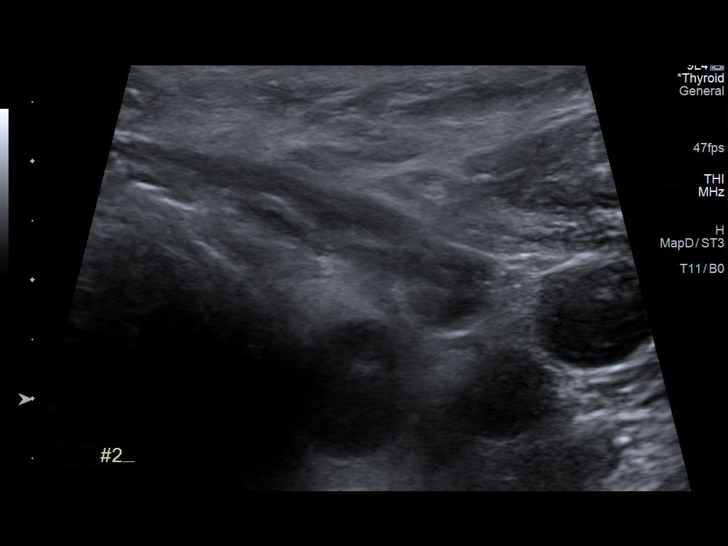
[im 10/14]
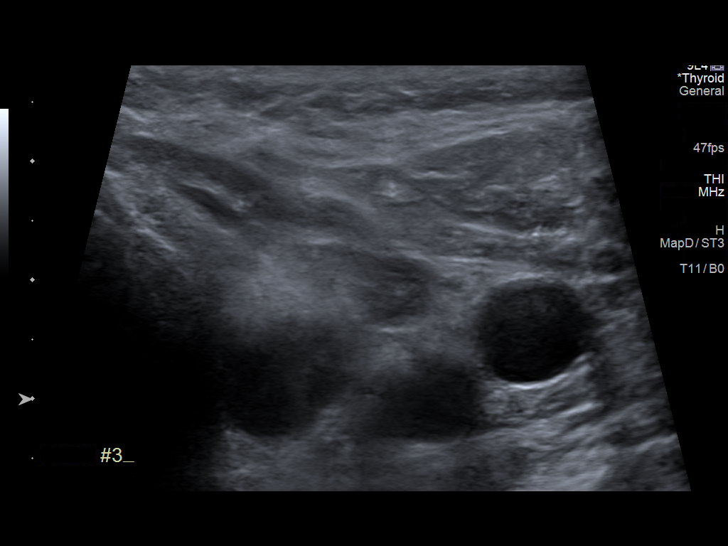
[im 11/14]
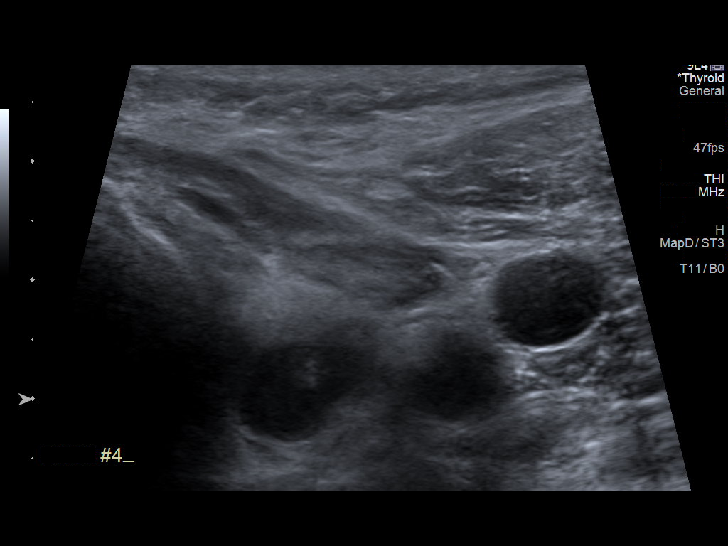
[im 12/14]
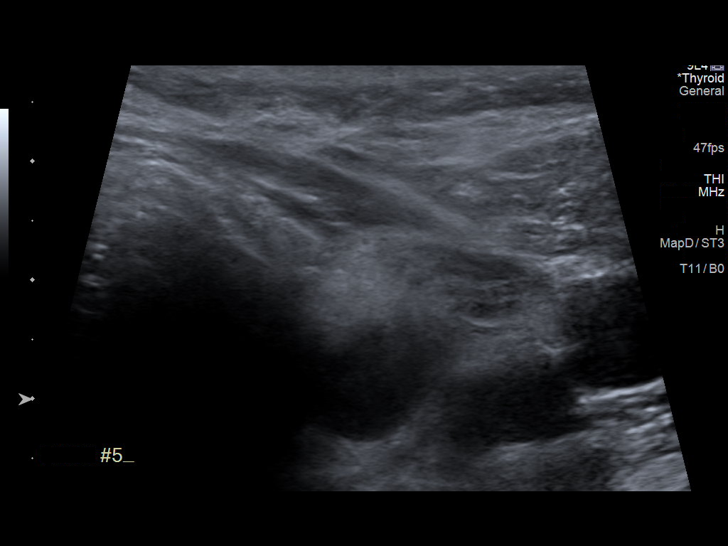
[im 13/14]
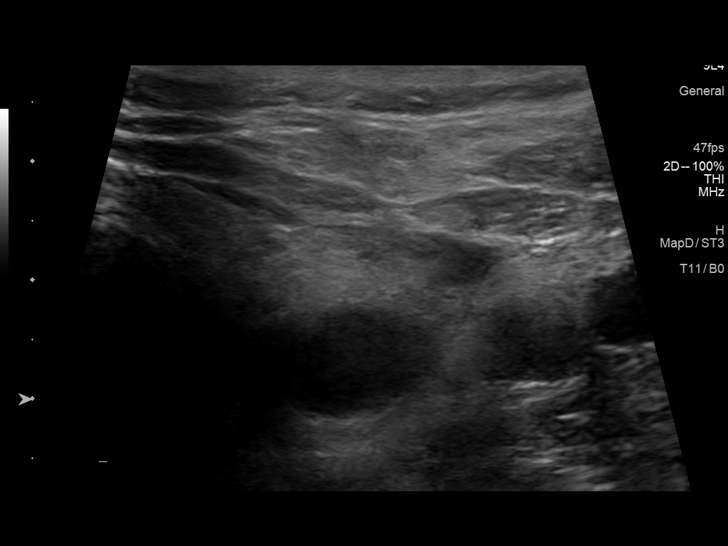
[im 14/14]
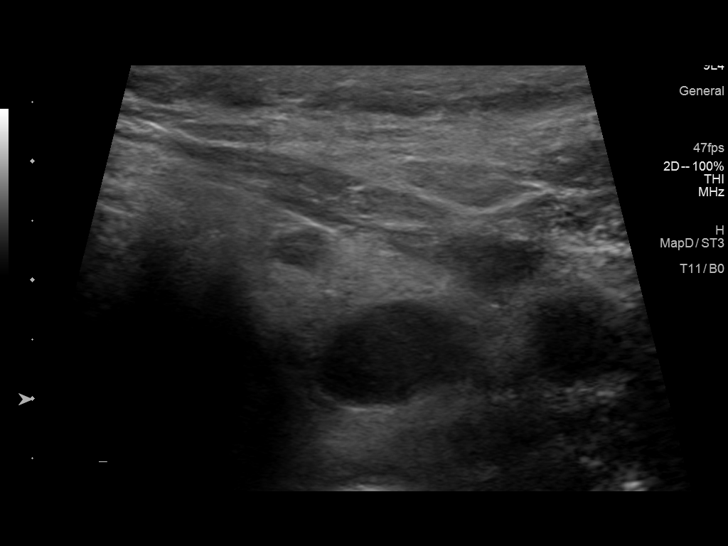

[13 of 14 positions shown; findings below may reference images not displayed]

Pre-procedural ultrasound scanning demonstrated unchanged size and
appearance of the indeterminate nodule within the left mid to upper
gland

The procedure was planned. The neck was prepped in the usual sterile
fashion, and a sterile drape was applied covering the operative
field. A timeout was performed prior to the initiation of the
procedure. Local anesthesia was provided with 1% lidocaine.

Under direct ultrasound guidance, 5 FNA biopsies were performed of
the thyroid nodule with a 25 gauge needle. Two passes were reserved
for Afirma testing. Multiple ultrasound images were saved for
procedural documentation purposes. The samples were prepared and
submitted to pathology.

Limited post procedural scanning was negative for hematoma or
additional complication. Dressings were placed. The patient
tolerated the above procedures procedure well without immediate
postprocedural complication.
FINDINGS: Nodule reference number based on prior diagnostic ultrasound: 7.

Maximum size: 1.5 cm

Location: Left; Superior

ACR TI-RADS risk category: TR4 (4-6 points)

Reason for biopsy: meets ACR TI-RADS criteria

Ultrasound imaging confirms appropriate placement of the needles
within the thyroid nodule.
IMPRESSION: Technically successful ultrasound guided fine needle aspiration of
TI-RADS category 4 nodule in the left mid to superior gland.

## 2022-02-10 ENCOUNTER — Encounter: Payer: Self-pay | Admitting: Endocrinology

## 2022-02-10 ENCOUNTER — Ambulatory Visit (INDEPENDENT_AMBULATORY_CARE_PROVIDER_SITE_OTHER): Payer: 59 | Admitting: Endocrinology

## 2022-02-10 VITALS — BP 106/62 | HR 75 | Ht 73.0 in | Wt 288.6 lb

## 2022-02-10 DIAGNOSIS — E042 Nontoxic multinodular goiter: Secondary | ICD-10-CM

## 2022-02-10 NOTE — Progress Notes (Signed)
Patient ID: Dionel Archey, male   DOB: 1965-02-11, 57 y.o.   MRN: [id number]           Reason for Appointment: Goiter, follow-up consultation    History of Present Illness:   The patient's thyroid enlargement was first discovered incidentally when he had a CT scan of his neck area in 11/22 showing multiple nodules This was subsequently evaluated with ultrasound, results given below  He has had no difficulty with swallowing any local pressure or discomfort Has not found his neck area in the front to be swollen  Thyroid levels have been consistently normal  Lab Results  Component Value Date   FREET4 1.25 09/25/2021   TSH 1.350 09/25/2021   TSH 1.60 12/04/2020   TSH 1.41 09/08/2006    He has had an ultrasound exam in 11/2020 with the following impressions:  1. Multinodular thyroid gland. 2. Nodule labeled 7 in the superior left thyroid lobe meets criteria for biopsy. 3. Bilateral nodules labeled 3, 4, 5 and 10 meet criteria for follow-up ultrasound in 1 year.  Thyroid biopsy in 12/2020 for a TI-RADS 4 of the 1.5 cm nodule on the left showed follicular lesion of uncertain significance but Afirma testing was benign  He is here for follow-up now  Allergies as of 02/10/2022       Reactions   Quinolones    Avoid due to aneurysm        Medication List        Accurate as of February 10, 2022  3:26 PM. If you have any questions, ask your nurse or doctor.          FreeStyle Libre 3 Sensor Misc Place 1 sensor on the skin every 14 days. Use to check glucose continuously   lisinopril-hydrochlorothiazide 10-12.5 MG tablet Commonly known as: ZESTORETIC TAKE 1 TABLET BY MOUTH DAILY FOR BLOOD PRESSURE   metFORMIN 500 MG 24 hr tablet Commonly known as: GLUCOPHAGE-XR TAKE 2 TABLETS (1,000 MG TOTAL) BY MOUTH DAILY WITH BREAKFAST. FOR DIABETES.   prednisoLONE acetate 1 % ophthalmic suspension Commonly known as: PRED FORTE Place 1 drop into the left eye daily.    rosuvastatin 5 MG tablet Commonly known as: CRESTOR Take 1 tablet (5 mg total) by mouth daily. For cholesterol.   Vitamin D (Ergocalciferol) 1.25 MG (50000 UNIT) Caps capsule Commonly known as: DRISDOL Take 1 capsule (50,000 Units total) by mouth every 7 (seven) days.        Allergies:  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    Past Medical History:  Diagnosis Date   Borderline diabetes    Cholelithiases 11/25/2020   COVID-19 virus infection 06/14/2020   Diabetes mellitus without complication ([place])    Hyperlipidemia    Hypertension    Hypoxemia 11/25/2020   Sepsis ([place]) 11/25/2020   Sleep apnea    wears CPAP   Syncope 11/25/2020   Vasovagal reaction     Past Surgical History:  Procedure Laterality Date   CHOLECYSTECTOMY N/A 11/27/2020   Procedure: LAPAROSCOPIC CHOLECYSTECTOMY;  Surgeon: Georganna Skeans, MD;  Location: [facility];  Service: General;  Laterality: N/A;   EYE SURGERY     Corneal dystrophies    Family History  Adopted: Yes  Problem Relation Age of Onset   Other Other        adopted   Thyroid disease Neg Hx     Social History:  reports that he has never smoked. He has never  used smokeless tobacco. He reports that he does not currently use alcohol. He reports that he does not use drugs.   Review of Systems    Examination:   BP 106/62   Pulse 75   Ht '6\' 1"'$  (1.854 m)   Wt 288 lb 9.6 oz (130.9 kg)   SpO2 96%   BMI 38.08 kg/m    Thyroid exam: No significant thyroid enlargement felt on the right side and isthmus Firm thyroid nodule felt in the lower left pole medially and about 2 cm in size No local lymphadenopathy. Johney Frame sign is negative   Assessment/Plan:  Multinodular goiter discovered in 2022 with relatively only mild overall thyroid enlargement and small nodules His clinical exam appears similar to his last visit Although likely he has small benign nodules follow-up was recommended on the previous  ultrasound and will be scheduled  If no significant change he can be seen once a year  Elayne Snare 02/10/2022

## 2022-02-10 NOTE — Progress Notes (Unsigned)
Chief Complaint:   OBESITY Antonio Horton is here to discuss his progress with his obesity treatment plan along with follow-up of his obesity related diagnoses. Antonio Horton is on the Category 4 Plan and states he is following his eating plan approximately 75% of the time. Antonio Horton states he is doing sit-ups and leg lifts for 20 minutes 5 times per week.  Today's visit was #: 8 Starting weight: 313 lbs Starting date: 09/25/2021 Today's weight: 284 lbs Today's date: 01/30/2022 Total lbs lost to date: 29 Total lbs lost since last in-office visit: 0  Interim History: Antonio Horton had a lot of extra challenges in the last month.  He was not able to concentrate on meal planning, but he was still mindful of his choices.  He is hoping things will calm down soon in his life.  Subjective:   1. Vitamin D deficiency Antonio Horton is on vitamin D, but his level is not yet at goal.  No side effects were noted.  Assessment/Plan:   1. Vitamin D deficiency Antonio Horton will continue vitamin D prescription, and we will refill as needed.  2. Obesity, Current BMI 37.5 Antonio Horton is currently in the action stage of change. As such, his goal is to continue with weight loss efforts. He has agreed to the Category 4 Plan.   Exercise goals: As is.   Behavioral modification strategies: increasing lean protein intake and meal planning and cooking strategies.  Antonio Horton has agreed to follow-up with our clinic in 3 weeks. He was informed of the importance of frequent follow-up visits to maximize his success with intensive lifestyle modifications for his multiple health conditions.   Objective:   Blood pressure 114/77, pulse 65, temperature 98.2 F (36.8 C), height '6\' 1"'$  (1.854 m), weight 284 lb 3.2 oz (128.9 kg), SpO2 99 %. Body mass index is 37.5 kg/m.  General: Cooperative, alert, well developed, in no acute distress. HEENT: Conjunctivae and lids unremarkable. Cardiovascular: Regular rhythm.   Lungs: Normal work of breathing. Neurologic: No focal deficits.   Lab Results  Component Value Date   CREATININE 1.14 10/07/2021   BUN 22 10/07/2021   NA 138 10/07/2021   K 4.3 10/07/2021   CL 101 10/07/2021   CO2 28 10/07/2021   Lab Results  Component Value Date   ALT 22 10/07/2021   AST 17 10/07/2021   ALKPHOS 43 10/07/2021   BILITOT 0.9 10/07/2021   Lab Results  Component Value Date   HGBA1C 7.4 (H) 09/25/2021   HGBA1C 7.1 (A) 09/10/2021   HGBA1C 7.2 (H) 03/13/2021   HGBA1C 6.6 (H) 11/25/2020   HGBA1C 5.4 03/28/2020   Lab Results  Component Value Date   INSULIN 41.9 (H) 09/25/2021   Lab Results  Component Value Date   TSH 1.350 09/25/2021   Lab Results  Component Value Date   CHOL 121 09/25/2021   HDL 43 09/25/2021   LDLCALC 57 09/25/2021   TRIG 114 09/25/2021   CHOLHDL 3 03/13/2021   Lab Results  Component Value Date   VD25OH 19.9 (L) 09/25/2021   Lab Results  Component Value Date   WBC 6.0 10/07/2021   HGB 15.1 10/07/2021   HCT 44.5 10/07/2021   MCV 89.5 10/07/2021   PLT 216.0 10/07/2021   Lab Results  Component Value Date   IRON 94 04/12/2021   FERRITIN 278.7 04/12/2021   Attestation Statements:   Reviewed by clinician on Antonio Horton of visit: allergies, medications, problem list, medical history, surgical history, family history, social history, and previous encounter  notes.   I, Trixie Dredge, am acting as transcriptionist for Dennard Nip, MD.  I have reviewed the above documentation for accuracy and completeness, and I agree with the above. -  Dennard Nip, MD

## 2022-02-27 ENCOUNTER — Encounter (INDEPENDENT_AMBULATORY_CARE_PROVIDER_SITE_OTHER): Payer: Self-pay | Admitting: Family Medicine

## 2022-02-27 ENCOUNTER — Ambulatory Visit (INDEPENDENT_AMBULATORY_CARE_PROVIDER_SITE_OTHER): Payer: 59 | Admitting: Family Medicine

## 2022-02-27 VITALS — BP 121/74 | HR 75 | Temp 97.5°F | Ht 73.0 in | Wt 282.0 lb

## 2022-02-27 DIAGNOSIS — E7849 Other hyperlipidemia: Secondary | ICD-10-CM | POA: Diagnosis not present

## 2022-02-27 DIAGNOSIS — E559 Vitamin D deficiency, unspecified: Secondary | ICD-10-CM

## 2022-02-27 DIAGNOSIS — Z6837 Body mass index (BMI) 37.0-37.9, adult: Secondary | ICD-10-CM | POA: Insufficient documentation

## 2022-02-27 DIAGNOSIS — Z6836 Body mass index (BMI) 36.0-36.9, adult: Secondary | ICD-10-CM | POA: Insufficient documentation

## 2022-02-27 DIAGNOSIS — E538 Deficiency of other specified B group vitamins: Secondary | ICD-10-CM

## 2022-02-27 DIAGNOSIS — Z7984 Long term (current) use of oral hypoglycemic drugs: Secondary | ICD-10-CM

## 2022-02-27 DIAGNOSIS — Z6838 Body mass index (BMI) 38.0-38.9, adult: Secondary | ICD-10-CM | POA: Insufficient documentation

## 2022-02-27 DIAGNOSIS — E7800 Pure hypercholesterolemia, unspecified: Secondary | ICD-10-CM

## 2022-02-27 DIAGNOSIS — E669 Obesity, unspecified: Secondary | ICD-10-CM

## 2022-02-27 DIAGNOSIS — Z6835 Body mass index (BMI) 35.0-35.9, adult: Secondary | ICD-10-CM | POA: Insufficient documentation

## 2022-02-27 DIAGNOSIS — E1165 Type 2 diabetes mellitus with hyperglycemia: Secondary | ICD-10-CM | POA: Diagnosis not present

## 2022-02-27 MED ORDER — VITAMIN D (ERGOCALCIFEROL) 1.25 MG (50000 UNIT) PO CAPS: 50000.0000 [IU] | ORAL_CAPSULE | ORAL | 0 refills | Status: DC

## 2022-03-01 LAB — HEMOGLOBIN A1C
Est. average glucose Bld gHb Est-mCnc: 128 mg/dL
Hgb A1c MFr Bld: 6.1 % — ABNORMAL HIGH (ref 4.8–5.6)

## 2022-03-01 LAB — LIPID PANEL WITH LDL/HDL RATIO
Cholesterol, Total: 122 mg/dL (ref 100–199)
HDL: 42 mg/dL (ref >39)
LDL Chol Calc (NIH): 66 mg/dL (ref 0–99)
LDL/HDL Ratio: 1.6 ratio (ref 0.0–3.6)
Triglycerides: 69 mg/dL (ref 0–149)
VLDL Cholesterol Cal: 14 mg/dL (ref 5–40)

## 2022-03-01 LAB — CMP14+EGFR
ALT: 23 IU/L (ref 0–44)
AST: 16 IU/L (ref 0–40)
Albumin/Globulin Ratio: 1.6 (ref 1.2–2.2)
Albumin: 4.3 g/dL (ref 3.8–4.9)
Alkaline Phosphatase: 63 IU/L (ref 44–121)
BUN/Creatinine Ratio: 20 (ref 9–20)
BUN: 21 mg/dL (ref 6–24)
Bilirubin Total: 0.5 mg/dL (ref 0.0–1.2)
CO2: 24 mmol/L (ref 20–29)
Calcium: 9.4 mg/dL (ref 8.7–10.2)
Chloride: 103 mmol/L (ref 96–106)
Creatinine, Ser: 1.06 mg/dL (ref 0.76–1.27)
Globulin, Total: 2.7 g/dL (ref 1.5–4.5)
Glucose: 119 mg/dL — ABNORMAL HIGH (ref 70–99)
Potassium: 5 mmol/L (ref 3.5–5.2)
Sodium: 142 mmol/L (ref 134–144)
Total Protein: 7 g/dL (ref 6.0–8.5)
eGFR: 83 mL/min/{1.73_m2} (ref >59)

## 2022-03-01 LAB — MICROALBUMIN / CREATININE URINE RATIO
Creatinine, Urine: 132.6 mg/dL
Microalb/Creat Ratio: <2 mg/g creat (ref 0–29)
Microalbumin, Urine: <3 ug/mL

## 2022-03-01 LAB — VITAMIN B12: Vitamin B-12: 404 pg/mL (ref 232–1245)

## 2022-03-01 LAB — INSULIN, RANDOM: INSULIN: 18 u[IU]/mL (ref 2.6–24.9)

## 2022-03-01 LAB — VITAMIN D 25 HYDROXY (VIT D DEFICIENCY, FRACTURES): Vit D, 25-Hydroxy: 41.5 ng/mL (ref 30.0–100.0)

## 2022-03-03 ENCOUNTER — Ambulatory Visit
Admission: RE | Admit: 2022-03-03 | Discharge: 2022-03-03 | Disposition: A | Discharge status: Home / Self Care | Payer: 59 | Source: Ambulatory Visit | Attending: Endocrinology | Admitting: Endocrinology

## 2022-03-03 DIAGNOSIS — E042 Nontoxic multinodular goiter: Secondary | ICD-10-CM

## 2022-03-11 NOTE — Progress Notes (Signed)
Please call to let patient know that the thyroid ultrasound results show stability and no further action needed, come back in 1 year for follow-up

## 2022-03-12 ENCOUNTER — Other Ambulatory Visit: Payer: Self-pay | Admitting: Primary Care

## 2022-03-12 DIAGNOSIS — E7800 Pure hypercholesterolemia, unspecified: Secondary | ICD-10-CM

## 2022-03-12 DIAGNOSIS — E119 Type 2 diabetes mellitus without complications: Secondary | ICD-10-CM

## 2022-03-13 NOTE — Progress Notes (Signed)
Chief Complaint:   OBESITY Reynol is here to discuss his progress with his obesity treatment plan along with follow-up of his obesity related diagnoses. Chuck is on the Category 4 Plan and states he is following his eating plan approximately 50% of the time. Baer states he is doing sit-ups and leg lifts 3 times per week.    Today's visit was #: 9 Starting weight: 313 lbs Starting date: 09/25/2021 Today's weight: 282 lbs Today's date: 02/27/2022 Total lbs lost to date: 31 Total lbs lost since last in-office visit: 2  Interim History: Naszir hasn't been able to meal plan recently. He is doing more grab and go, but he is trying to increase his protein.   Subjective:   1. Vitamin D deficiency Cooper is on prescription Vitamin D, and she is due for labs.   2. B12 deficiency Jayanth is on metformin, and his last B12 is below goal. He is on a B12 rich diet.   3. Type 2 diabetes mellitus with hyperglycemia, without long-term current use of insulin (HCC) Billyray is on metformin, and his last A1c was above goal. He denies nausea, vomiting, diarrhea on metformin.   4. Other hyperlipidemia, pure Moussa is working on decreasing cholesterol in diet, and he is due for labs. He is on Crestor with no side effects.   Assessment/Plan:   1. Vitamin D deficiency We will check labs today, and we will refill prescription Vitamin D for 1 month.   - Vitamin D, Ergocalciferol, (DRISDOL) 1.25 MG (50000 UNIT) CAPS capsule; Take 1 capsule (50,000 Units total) by mouth every 7 (seven) days.  Dispense: 4 capsule; Refill: 0 - VITAMIN D 25 Hydroxy (Vit-D Deficiency, Fractures)  2. B12 deficiency We will check labs today, and we will follow-up at Jerrid's next visit.   - Vitamin B12  3. Type 2 diabetes mellitus with hyperglycemia, without long-term current use of insulin (HCC) We will check labs today. Ida is to continue to work on his diet,  exercise, and weight loss.   - CMP14+EGFR - Insulin, random - Hemoglobin A1c - Microalbumin / creatinine urine ratio  4. Other hyperlipidemia, pure We will check labs today. Woodruff will continue to work on diet, exercise and weight loss efforts. Orders and follow up as documented in patient record.   - Lipid Panel With LDL/HDL Ratio  5. BMI 37.0-37.9, adult  6. Obesity, Beginning BMI 41.56 Brin is currently in the action stage of change. As such, his goal is to continue with weight loss efforts. He has agreed to practicing portion control and making smarter food choices, such as increasing vegetables and decreasing simple carbohydrates.   Exercise goals: As is.   Behavioral modification strategies: increasing lean protein intake.  Zymari has agreed to follow-up with our clinic in 4 weeks. He was informed of the importance of frequent follow-up visits to maximize his success with intensive lifestyle modifications for his multiple health conditions.   Mccray was informed we would discuss his lab results at his next visit unless there is a critical issue that needs to be addressed sooner. Ammon agreed to keep his next visit at the agreed upon time to discuss these results.  Objective:   Blood pressure 121/74, pulse 75, temperature (!) 97.5 F (36.4 C), height 6' 1"$  (1.854 m), weight 282 lb (127.9 kg), SpO2 97 %. Body mass index is 37.21 kg/m.  General: Cooperative, alert, well developed, in no acute distress. HEENT: Conjunctivae and lids unremarkable. Cardiovascular: Regular rhythm.  Lungs: Normal work of breathing. Neurologic: No focal deficits.   Lab Results  Component Value Date   CREATININE 1.06 02/27/2022   BUN 21 02/27/2022   NA 142 02/27/2022   K 5.0 02/27/2022   CL 103 02/27/2022   CO2 24 02/27/2022   Lab Results  Component Value Date   ALT 23 02/27/2022   AST 16 02/27/2022   ALKPHOS 63 02/27/2022   BILITOT 0.5 02/27/2022   Lab  Results  Component Value Date   HGBA1C 6.1 (H) 02/27/2022   HGBA1C 7.4 (H) 09/25/2021   HGBA1C 7.1 (A) 09/10/2021   HGBA1C 7.2 (H) 03/13/2021   HGBA1C 6.6 (H) 11/25/2020   Lab Results  Component Value Date   INSULIN 18.0 02/27/2022   INSULIN 41.9 (H) 09/25/2021   Lab Results  Component Value Date   TSH 1.350 09/25/2021   Lab Results  Component Value Date   CHOL 122 02/27/2022   HDL 42 02/27/2022   LDLCALC 66 02/27/2022   TRIG 69 02/27/2022   CHOLHDL 3 03/13/2021   Lab Results  Component Value Date   VD25OH 41.5 02/27/2022   VD25OH 19.9 (L) 09/25/2021   Lab Results  Component Value Date   WBC 6.0 10/07/2021   HGB 15.1 10/07/2021   HCT 44.5 10/07/2021   MCV 89.5 10/07/2021   PLT 216.0 10/07/2021   Lab Results  Component Value Date   IRON 94 04/12/2021   FERRITIN 278.7 04/12/2021   Attestation Statements:   Reviewed by clinician on day of visit: allergies, medications, problem list, medical history, surgical history, family history, social history, and previous encounter notes.   I, Trixie Dredge, am acting as transcriptionist for Dennard Nip, MD.  I have reviewed the above documentation for accuracy and completeness, and I agree with the above. -  Dennard Nip, MD

## 2022-03-14 ENCOUNTER — Encounter: Payer: 59 | Admitting: Primary Care

## 2022-03-18 ENCOUNTER — Encounter: Payer: Self-pay | Admitting: Primary Care

## 2022-03-18 ENCOUNTER — Ambulatory Visit (INDEPENDENT_AMBULATORY_CARE_PROVIDER_SITE_OTHER): Payer: 59 | Admitting: Primary Care

## 2022-03-18 VITALS — BP 104/62 | HR 70 | Temp 97.3°F | Ht 73.0 in | Wt 288.0 lb

## 2022-03-18 DIAGNOSIS — H18519 Endothelial corneal dystrophy, unspecified eye: Secondary | ICD-10-CM

## 2022-03-18 DIAGNOSIS — Z125 Encounter for screening for malignant neoplasm of prostate: Secondary | ICD-10-CM

## 2022-03-18 DIAGNOSIS — E042 Nontoxic multinodular goiter: Secondary | ICD-10-CM

## 2022-03-18 DIAGNOSIS — Z0000 Encounter for general adult medical examination without abnormal findings: Secondary | ICD-10-CM | POA: Diagnosis not present

## 2022-03-18 DIAGNOSIS — E7800 Pure hypercholesterolemia, unspecified: Secondary | ICD-10-CM

## 2022-03-18 DIAGNOSIS — I10 Essential (primary) hypertension: Secondary | ICD-10-CM

## 2022-03-18 DIAGNOSIS — G4733 Obstructive sleep apnea (adult) (pediatric): Secondary | ICD-10-CM

## 2022-03-18 DIAGNOSIS — I7121 Aneurysm of the ascending aorta, without rupture: Secondary | ICD-10-CM | POA: Diagnosis not present

## 2022-03-18 DIAGNOSIS — K760 Fatty (change of) liver, not elsewhere classified: Secondary | ICD-10-CM

## 2022-03-18 DIAGNOSIS — I2510 Atherosclerotic heart disease of native coronary artery without angina pectoris: Secondary | ICD-10-CM

## 2022-03-18 DIAGNOSIS — E1165 Type 2 diabetes mellitus with hyperglycemia: Secondary | ICD-10-CM

## 2022-03-18 LAB — PSA: PSA: 1.12 ng/mL (ref 0.10–4.00)

## 2022-03-18 NOTE — Assessment & Plan Note (Signed)
Compliant to CPAP machine nightly, continue nightly use.

## 2022-03-18 NOTE — Progress Notes (Signed)
Subjective:    Patient ID: Antonio Horton, male    DOB: Feb 12, 1965, 57 y.o.   MRN: [id number]  HPI  Antonio Horton is a very pleasant 57 y.o. male who presents today for complete physical and follow up of chronic conditions.  Immunizations: -Tetanus: Completed in 2021 -Influenza: Declines  -Shingles: Completed Shingrix series -Pneumonia: Completed in 2021  Diet: Phil [place].  Exercise: No regular exercise.  Eye exam: Completes annually  Dental exam: Completed several years ago    Colonoscopy: Completed in 2021, due 2028  PSA: Due  BP Readings from Last 3 Encounters:  03/18/22 104/62  02/27/22 121/74  02/10/22 106/62   Wt Readings from Last 3 Encounters:  03/18/22 288 lb (130.6 kg)  02/27/22 282 lb (127.9 kg)  02/10/22 288 lb 9.6 oz (130.9 kg)      Review of Systems  Constitutional:  Negative for unexpected weight change.  HENT:  Negative for rhinorrhea.   Respiratory:  Negative for cough and shortness of breath.   Cardiovascular:  Negative for chest pain.  Gastrointestinal:  Negative for constipation and diarrhea.  Genitourinary:  Negative for difficulty urinating.  Musculoskeletal:  Positive for arthralgias.  Skin:  Negative for rash.  Allergic/Immunologic: Positive for environmental allergies.  Neurological:  Negative for dizziness and headaches.  Psychiatric/Behavioral:  The patient is not nervous/anxious.          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   Borderline diabetes    Cholelithiases 11/25/2020   COVID-19 virus infection 06/14/2020   Diabetes mellitus without complication ([place])    Hepatic steatosis 12/05/2020   Hyperlipidemia    Hypertension    Hypoxemia 11/25/2020   S/P laparoscopic cholecystectomy 11/27/2020   Sepsis ([place]) 11/25/2020   Sleep apnea    wears CPAP   SOBOE (shortness of breath on exertion) 09/25/2021   Syncope 11/25/2020   Vasovagal reaction     Social  History   Socioeconomic History   Marital status: Married    Spouse name: Not on file   Number of children: Not on file   Years of education: Not on file   Highest education level: Not on file  Occupational History   Not on file  Tobacco Use   Smoking status: Never   Smokeless tobacco: Never  Vaping Use   Vaping Use: Never used  Substance and Sexual Activity   Alcohol use: Not Currently    Alcohol/week: 0.0 standard drinks of alcohol    Comment: socially   Drug use: No   Sexual activity: Not on file  Other Topics Concern   Not on file  Social History Narrative   Married.   2 children.   Works as a Administrator, sports.   Enjoys reading.    Social Determinants of Health   Financial Resource Strain: Not on file  Food Insecurity: Not on file  Transportation Needs: Not on file  Physical Activity: Not on file  Stress: Not on file  Social Connections: Not on file  Intimate Partner Violence: Not on file    Past Surgical History:  Procedure Laterality Date   CHOLECYSTECTOMY N/A 11/27/2020   Procedure: LAPAROSCOPIC CHOLECYSTECTOMY;  Surgeon: Georganna Skeans, MD;  Location: [facility];  Service: General;  Laterality: N/A;   EYE SURGERY     Corneal dystrophies    Family History  Adopted: Yes  Problem Relation Age of Onset   Other Other        adopted   Thyroid  disease Neg Hx     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    Current Outpatient Medications on File Prior to Visit  Medication Sig Dispense Refill   Continuous Blood Gluc Sensor (FREESTYLE LIBRE 3 SENSOR) MISC Place 1 sensor on the skin every 14 days. Use to check glucose continuously 6 each 1   lisinopril-hydrochlorothiazide (ZESTORETIC) 10-12.5 MG tablet TAKE 1 TABLET BY MOUTH DAILY FOR BLOOD PRESSURE 90 tablet 1   metFORMIN (GLUCOPHAGE-XR) 500 MG 24 hr tablet TAKE 2 TABLETS (1,000 MG TOTAL) BY MOUTH DAILY WITH BREAKFAST. FOR DIABETES. 180 tablet 0   prednisoLONE acetate (PRED  FORTE) 1 % ophthalmic suspension Place 1 drop into the left eye daily.      rosuvastatin (CRESTOR) 5 MG tablet TAKE 1 TABLET (5 MG TOTAL) BY MOUTH DAILY FOR CHOLESTEROL 90 tablet 0   Vitamin D, Ergocalciferol, (DRISDOL) 1.25 MG (50000 UNIT) CAPS capsule Take 1 capsule (50,000 Units total) by mouth every 7 (seven) days. 4 capsule 0   No current facility-administered medications on file prior to visit.    BP 104/62   Pulse 70   Temp (!) 97.3 F (36.3 C) (Temporal)   Ht '6\' 1"'$  (1.854 m)   Wt 288 lb (130.6 kg)   SpO2 98%   BMI 38.00 kg/m  Objective:   Physical Exam HENT:     Right Ear: Tympanic membrane and ear canal normal.     Left Ear: Tympanic membrane and ear canal normal.     Nose: Nose normal.     Right Sinus: No maxillary sinus tenderness or frontal sinus tenderness.     Left Sinus: No maxillary sinus tenderness or frontal sinus tenderness.  Eyes:     Conjunctiva/sclera: Conjunctivae normal.  Neck:     Thyroid: No thyromegaly.     Vascular: No carotid bruit.  Cardiovascular:     Rate and Rhythm: Normal rate and regular rhythm.     Heart sounds: Normal heart sounds.  Pulmonary:     Effort: Pulmonary effort is normal.     Breath sounds: Normal breath sounds. No wheezing or rales.  Abdominal:     General: Bowel sounds are normal.     Palpations: Abdomen is soft.     Tenderness: There is no abdominal tenderness.  Musculoskeletal:        General: Normal range of motion.     Cervical back: Neck supple.  Skin:    General: Skin is warm and dry.  Neurological:     Mental Status: He is alert and oriented to person, place, and time.     Cranial Nerves: No cranial nerve deficit.     Deep Tendon Reflexes: Reflexes are normal and symmetric.  Psychiatric:        Mood and Affect: Mood normal.           Assessment & Plan:  Preventative health care Assessment & Plan: Immunizations UTD. Declines influenza vaccine. Colonoscopy UTD, due 2028 PSA due and  pending.  Discussed the importance of a healthy diet and regular exercise in order for weight loss, and to reduce the risk of further co-morbidity.  Exam stable. Labs reviewed and pending.  Follow up in 1 year for repeat physical.    Aneurysm of ascending aorta without rupture [facility]) Assessment & Plan: Following with cardiothoracic surgery, office notes and CTA chest reviewed from January 2024. Continue semi-annual monitoring.   Non-alcoholic fatty liver disease Assessment & Plan: Following  with GI, reviewed office notes and liver US from March 2023. Repeat liver US due and pending.  Continue BP control, diabetes control, weight loss.   Obstructive sleep apnea Assessment & Plan: Compliant to CPAP machine nightly, continue nightly use.   Coronary artery disease involving native coronary artery of native heart without angina pectoris Assessment & Plan: Asymptomatic.  Continue BP, diabetes, and lipid control.     Essential hypertension Assessment & Plan: Overall stable, denies significant drops.  Continue lisinopril-HCTZ 10-12.5 mg daily.  CMP reviewed from February 2024 per [facility].    Multinodular goiter Assessment & Plan: Reviewed thyroid US from February 2024. Following with endocrinology.   Screening for prostate cancer -     PSA  Type 2 diabetes mellitus with hyperglycemia, without long-term current use of insulin (HCC) Assessment & Plan: Controlled with A1C of 6.1. Reviewed A1C from February 2024 through [facility].  Continue metformin XR 1000 mg daily for now. Continue to work on diet. Increase exercise.   Follow up in 6 months.   Fuchs' corneal dystrophy, unspecified laterality Assessment & Plan: Following with ophthalmology. Continue prednisolone 1% drops daily.   Pure hypercholesterolemia Assessment & Plan: Continue rosuvastatin 5 mg daily. Reviewed lipid panel from February 2024 through [facility].          Pleas Koch, NP

## 2022-03-18 NOTE — Assessment & Plan Note (Signed)
Following with GI, reviewed office notes and liver US from March 2023. Repeat liver US due and pending.  Continue BP control, diabetes control, weight loss.

## 2022-03-18 NOTE — Assessment & Plan Note (Signed)
Following with cardiothoracic surgery, office notes and CTA chest reviewed from January 2024. Continue semi-annual monitoring.

## 2022-03-18 NOTE — Assessment & Plan Note (Signed)
Overall stable, denies significant drops.  Continue lisinopril-HCTZ 10-12.5 mg daily.  CMP reviewed from February 2024 per [facility].

## 2022-03-18 NOTE — Assessment & Plan Note (Signed)
Asymptomatic.  Continue BP, diabetes, and lipid control.

## 2022-03-18 NOTE — Assessment & Plan Note (Signed)
Following with ophthalmology. Continue prednisolone 1% drops daily.

## 2022-03-18 NOTE — Assessment & Plan Note (Signed)
Controlled with A1C of 6.1. Reviewed A1C from February 2024 through [facility].  Continue metformin XR 1000 mg daily for now. Continue to work on diet. Increase exercise.   Follow up in 6 months.

## 2022-03-18 NOTE — Assessment & Plan Note (Signed)
Reviewed thyroid US from February 2024. Following with endocrinology.

## 2022-03-18 NOTE — Assessment & Plan Note (Signed)
Continue rosuvastatin 5 mg daily. Reviewed lipid panel from February 2024 through [facility].

## 2022-03-18 NOTE — Assessment & Plan Note (Signed)
Immunizations UTD. Declines influenza vaccine. Colonoscopy UTD, due 2028 PSA due and pending.  Discussed the importance of a healthy diet and regular exercise in order for weight loss, and to reduce the risk of further co-morbidity.  Exam stable. Labs reviewed and pending.  Follow up in 1 year for repeat physical.

## 2022-03-24 ENCOUNTER — Other Ambulatory Visit: Payer: Self-pay | Admitting: Primary Care

## 2022-03-24 DIAGNOSIS — I10 Essential (primary) hypertension: Secondary | ICD-10-CM

## 2022-03-27 ENCOUNTER — Ambulatory Visit (INDEPENDENT_AMBULATORY_CARE_PROVIDER_SITE_OTHER): Payer: 59 | Admitting: Family Medicine

## 2022-04-01 ENCOUNTER — Other Ambulatory Visit (INDEPENDENT_AMBULATORY_CARE_PROVIDER_SITE_OTHER): Payer: Self-pay | Admitting: Family Medicine

## 2022-04-01 ENCOUNTER — Encounter (INDEPENDENT_AMBULATORY_CARE_PROVIDER_SITE_OTHER): Payer: Self-pay | Admitting: Family Medicine

## 2022-04-01 ENCOUNTER — Ambulatory Visit (INDEPENDENT_AMBULATORY_CARE_PROVIDER_SITE_OTHER): Payer: 59 | Admitting: Family Medicine

## 2022-04-01 VITALS — BP 116/73 | HR 68 | Temp 97.9°F | Ht 73.0 in | Wt 286.0 lb

## 2022-04-01 DIAGNOSIS — E559 Vitamin D deficiency, unspecified: Secondary | ICD-10-CM | POA: Diagnosis not present

## 2022-04-01 DIAGNOSIS — Z6837 Body mass index (BMI) 37.0-37.9, adult: Secondary | ICD-10-CM

## 2022-04-01 DIAGNOSIS — E669 Obesity, unspecified: Secondary | ICD-10-CM | POA: Diagnosis not present

## 2022-04-01 DIAGNOSIS — E1165 Type 2 diabetes mellitus with hyperglycemia: Secondary | ICD-10-CM

## 2022-04-01 DIAGNOSIS — Z7984 Long term (current) use of oral hypoglycemic drugs: Secondary | ICD-10-CM

## 2022-04-02 MED ORDER — RYBELSUS 3 MG PO TABS: 3.0000 mg | ORAL_TABLET | Freq: Every day | ORAL | 0 refills | Status: DC

## 2022-04-02 MED ORDER — VITAMIN D (ERGOCALCIFEROL) 1.25 MG (50000 UNIT) PO CAPS: 50000.0000 [IU] | ORAL_CAPSULE | ORAL | 0 refills | Status: DC

## 2022-04-09 NOTE — Progress Notes (Unsigned)
Chief Complaint:   OBESITY Antonio Horton is here to discuss his progress with his obesity treatment plan along with follow-up of his obesity related diagnoses. [name] is on practicing portion control and making smarter food choices, such as increasing vegetables and decreasing simple carbohydrates and states he is following his eating plan approximately 85% of the time. [name] states he is doing sit-ups and leg lifts for 15-20 minutes 3 times per week.  Today's visit was #: 10 Starting weight: 313 lbs Starting date: 09/25/2021 Today's weight: 286 lbs Today's date: 04/01/2022 Total lbs lost to date: 27 Total lbs lost since last in-office visit: 0  Interim History: Antonio Horton has been struggling with weight loss this month.  He notes increased hunger, and he is working on meal planning and meeting his protein goals.  Subjective:   1. Vitamin D deficiency Antonio Horton is on vitamin D, and he denies nausea, vomiting, or muscle weakness.  2. Type 2 diabetes mellitus with hyperglycemia, without long-term current use of insulin ([place]) Antonio Horton is on metformin with no GI issues, but he notes worsening polyphagia.  Assessment/Plan:   1. Vitamin D deficiency Havyn will continue prescription vitamin D 50,000 units once weekly, and we will refill for 1 month.  - Vitamin D, Ergocalciferol, (DRISDOL) 1.25 MG (50000 UNIT) CAPS capsule; Take 1 capsule (50,000 Units total) by mouth every 7 (seven) days.  Dispense: 4 capsule; Refill: 0  2. Type 2 diabetes mellitus with hyperglycemia, without long-term current use of insulin (HCC) Glennis agreed to start Rybelsus 3 mg once every morning with no refills.  He will continue metformin and his diet.  - Semaglutide (RYBELSUS) 3 MG TABS; Take 1 tablet (3 mg total) by mouth daily.  Dispense: 30 tablet; Refill: 0  3. BMI 37.0-37.9, adult  4. Obesity, Beginning BMI 41.56 Antonio Horton is currently in the action stage of change. As  such, his goal is to continue with weight loss efforts. He has agreed to the Category 4 Plan.   Exercise goals: As is.   Behavioral modification strategies: increasing lean protein intake, no skipping meals, and meal planning and cooking strategies.  Antonio Horton has agreed to follow-up with our clinic in 3 to 4 weeks. He was informed of the importance of frequent follow-up visits to maximize his success with intensive lifestyle modifications for his multiple health conditions.   Objective:   Blood pressure 116/73, pulse 68, temperature 97.9 F (36.6 C), height 6\' 1"  (1.854 m), weight 286 lb (129.7 kg), SpO2 96 %. Body mass index is 37.73 kg/m.  Lab Results  Component Value Date   CREATININE 1.06 02/27/2022   BUN 21 02/27/2022   NA 142 02/27/2022   K 5.0 02/27/2022   CL 103 02/27/2022   CO2 24 02/27/2022   Lab Results  Component Value Date   ALT 23 02/27/2022   AST 16 02/27/2022   ALKPHOS 63 02/27/2022   BILITOT 0.5 02/27/2022   Lab Results  Component Value Date   HGBA1C 6.1 (H) 02/27/2022   HGBA1C 7.4 (H) 09/25/2021   HGBA1C 7.1 (A) 09/10/2021   HGBA1C 7.2 (H) 03/13/2021   HGBA1C 6.6 (H) 11/25/2020   Lab Results  Component Value Date   INSULIN 18.0 02/27/2022   INSULIN 41.9 (H) 09/25/2021   Lab Results  Component Value Date   TSH 1.350 09/25/2021   Lab Results  Component Value Date   CHOL 122 02/27/2022   HDL 42 02/27/2022   LDLCALC 66 02/27/2022   TRIG 69 02/27/2022  CHOLHDL 3 03/13/2021   Lab Results  Component Value Date   VD25OH 41.5 02/27/2022   VD25OH 19.9 (L) 09/25/2021   Lab Results  Component Value Date   WBC 6.0 10/07/2021   HGB 15.1 10/07/2021   HCT 44.5 10/07/2021   MCV 89.5 10/07/2021   PLT 216.0 10/07/2021   Lab Results  Component Value Date   IRON 94 04/12/2021   FERRITIN 278.7 04/12/2021   Attestation Statements:   Reviewed by clinician on day of visit: allergies, medications, problem list, medical history, surgical history,  family history, social history, and previous encounter notes.   I, Trixie Dredge, am acting as transcriptionist for Dennard Nip, MD.  I have reviewed the above documentation for accuracy and completeness, and I agree with the above. -  Dennard Nip, MD

## 2022-04-29 ENCOUNTER — Ambulatory Visit (INDEPENDENT_AMBULATORY_CARE_PROVIDER_SITE_OTHER): Payer: 59 | Admitting: Family Medicine

## 2022-04-29 VITALS — BP 112/74 | HR 74 | Temp 97.7°F | Ht 73.0 in | Wt 289.0 lb

## 2022-04-29 DIAGNOSIS — E1165 Type 2 diabetes mellitus with hyperglycemia: Secondary | ICD-10-CM | POA: Diagnosis not present

## 2022-04-29 DIAGNOSIS — Z7984 Long term (current) use of oral hypoglycemic drugs: Secondary | ICD-10-CM

## 2022-04-29 DIAGNOSIS — Z6838 Body mass index (BMI) 38.0-38.9, adult: Secondary | ICD-10-CM

## 2022-04-29 DIAGNOSIS — E669 Obesity, unspecified: Secondary | ICD-10-CM

## 2022-04-29 DIAGNOSIS — E559 Vitamin D deficiency, unspecified: Secondary | ICD-10-CM

## 2022-04-29 MED ORDER — RYBELSUS 7 MG PO TABS
7.0000 mg | ORAL_TABLET | Freq: Every day | ORAL | 0 refills | Status: DC
Start: 2022-04-29 — End: 2022-05-27

## 2022-04-29 MED ORDER — VITAMIN D (ERGOCALCIFEROL) 1.25 MG (50000 UNIT) PO CAPS: 50000.0000 [IU] | ORAL_CAPSULE | ORAL | 0 refills | Status: DC

## 2022-04-30 NOTE — Progress Notes (Signed)
Chief Complaint:   OBESITY [name] is here to discuss his progress with his obesity treatment plan along with follow-up of his obesity related diagnoses. Antonio Horton is on the Category 4 Plan and states he is following his eating plan approximately 90-95% of the time. Antonio Horton states he is doing knee lifts and chair exercises for 15-20 minutes 3 times per week.  Today's visit was #: 11 Starting weight: 313 lbs Starting date: 09/25/2021 Today's weight: 289 lbs Today's date: 04/29/2022 Total lbs lost to date: 24 Total lbs lost since last in-office visit: 0  Interim History: Antonio Horton is doing better on his eating plan.  His weight is up, but this is due to increased water and increased muscle mass.  He is staying active especially with yard work.  His hunger is unchanged.  Subjective:   1. Type 2 diabetes mellitus with hyperglycemia, without long-term current use of insulin [name] started Rybelsus, with minimal nausea but no improvement in polyphagia.  2. Vitamin D deficiency Antonio Horton is stable on vitamin D, and he has no signs of over replacement.  Assessment/Plan:   1. Type 2 diabetes mellitus with hyperglycemia, without long-term current use of insulin [name] agreed to increase Rybelsus to 7 mg once daily, and we will refill for 1 month.  - Semaglutide (RYBELSUS) 7 MG TABS; Take 1 tablet (7 mg total) by mouth daily.  Dispense: 30 tablet; Refill: 0  2. Vitamin D deficiency [name] will continue prescription vitamin D, and we will refill for 1 month.  - Vitamin D, Ergocalciferol, (DRISDOL) 1.25 MG (50000 UNIT) CAPS capsule; Take 1 capsule (50,000 Units total) by mouth every 7 (seven) days.  Dispense: 4 capsule; Refill: 0  3. BMI 38.0-38.9,adult  4. Obesity, Beginning BMI 41.56 Antonio Horton is currently in the action stage of change. As such, his goal is to continue with weight loss efforts. He has agreed to the Category 4 Plan.   Exercise goals: As  is.   Behavioral modification strategies: increasing lean protein intake.  Antonio Horton has agreed to follow-up with our clinic in 4 weeks. He was informed of the importance of frequent follow-up visits to maximize his success with intensive lifestyle modifications for his multiple health conditions.   Objective:   Blood pressure 112/74, pulse 74, temperature 97.7 F (36.5 C), height 6\' 1"  (1.854 m), weight 289 lb (131.1 kg), SpO2 98 %. Body mass index is 38.13 kg/m.  Lab Results  Component Value Date   CREATININE 1.06 02/27/2022   BUN 21 02/27/2022   NA 142 02/27/2022   K 5.0 02/27/2022   CL 103 02/27/2022   CO2 24 02/27/2022   Lab Results  Component Value Date   ALT 23 02/27/2022   AST 16 02/27/2022   ALKPHOS 63 02/27/2022   BILITOT 0.5 02/27/2022   Lab Results  Component Value Date   HGBA1C 6.1 (H) 02/27/2022   HGBA1C 7.4 (H) 09/25/2021   HGBA1C 7.1 (A) 09/10/2021   HGBA1C 7.2 (H) 03/13/2021   HGBA1C 6.6 (H) 11/25/2020   Lab Results  Component Value Date   INSULIN 18.0 02/27/2022   INSULIN 41.9 (H) 09/25/2021   Lab Results  Component Value Date   TSH 1.350 09/25/2021   Lab Results  Component Value Date   CHOL 122 02/27/2022   HDL 42 02/27/2022   LDLCALC 66 02/27/2022   TRIG 69 02/27/2022   CHOLHDL 3 03/13/2021   Lab Results  Component Value Date   VD25OH 41.5 02/27/2022   VD25OH 19.9 (L)  09/25/2021   Lab Results  Component Value Date   WBC 6.0 10/07/2021   HGB 15.1 10/07/2021   HCT 44.5 10/07/2021   MCV 89.5 10/07/2021   PLT 216.0 10/07/2021   Lab Results  Component Value Date   IRON 94 04/12/2021   FERRITIN 278.7 04/12/2021   Attestation Statements:   Reviewed by clinician on day of visit: allergies, medications, problem list, medical history, surgical history, family history, social history, and previous encounter notes.   I, Burt Knack, am acting as transcriptionist for Quillian Quince, MD.  I have reviewed the above documentation  for accuracy and completeness, and I agree with the above. -  Quillian Quince, MD

## 2022-05-11 IMAGING — US US ABDOMEN LIMITED W/ ELASTOGRAPHY
1 series · 12 of 25 positions shown · non-contrast
Comparison: MRI November 25, 2020

CLINICAL DATA: Hepatic steatosis.

EXAM:
US ABDOMEN LIMITED - RIGHT UPPER QUADRANT
ULTRASOUND HEPATIC ELASTOGRAPHY
TECHNIQUE: Sonography of the right upper quadrant was performed. In addition,
ultrasound elastography evaluation of the liver was performed. A
region of interest was placed within the right lobe of the liver.
Following application of a compressive sonographic pulse, tissue
compressibility was assessed. Multiple assessments were performed at
the selected site. Median tissue compressibility was determined.
Previously, hepatic stiffness was assessed by shear wave velocity.
Based on recently published Society of Radiologists in Ultrasound
consensus article, reporting is now recommended to be performed in
the SI units of pressure (kiloPascals) representing hepatic
stiffness/elasticity. The obtained result is compared to the
published reference standards. (cACLD = compensated Advanced Chronic
Liver Disease)

[Series 1: us abdomen limited w/ elastography · 12 of 33 slices shown]
[im 2/33]
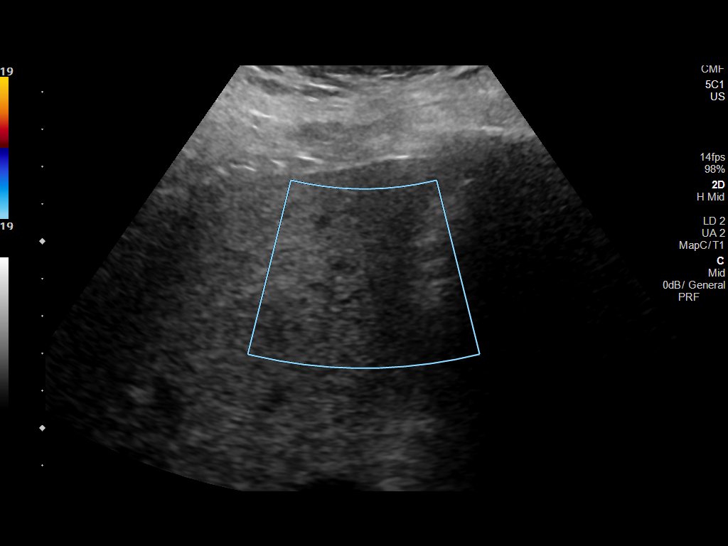
[im 5/33]
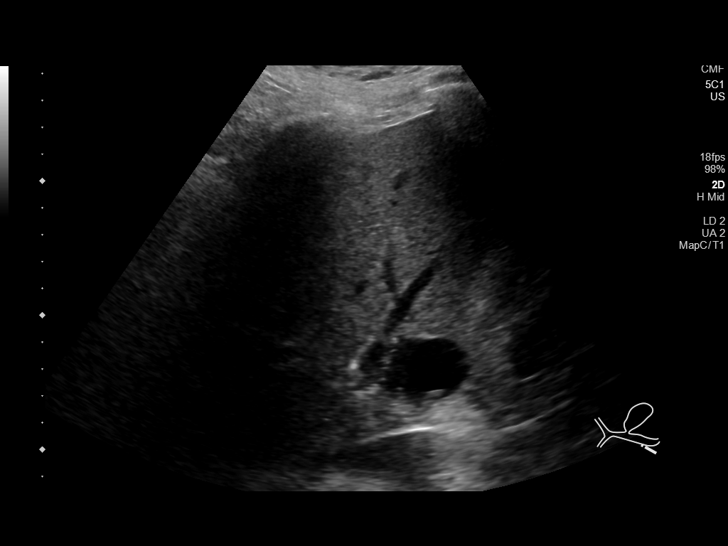
[im 7/33]
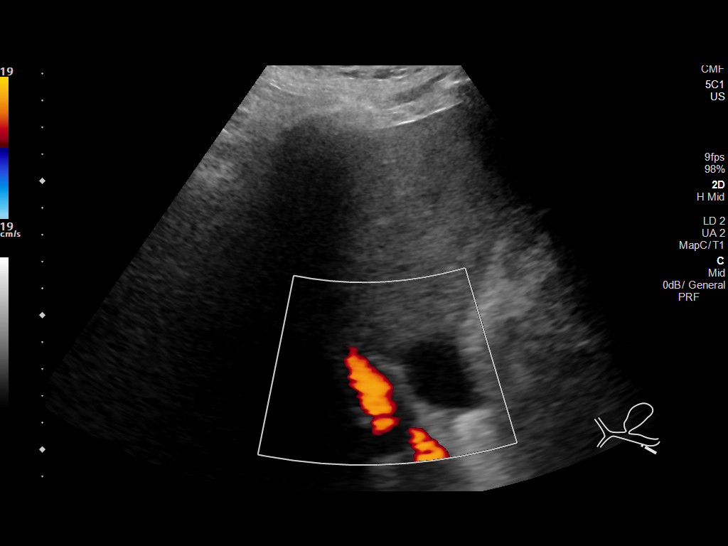
[im 10/33]
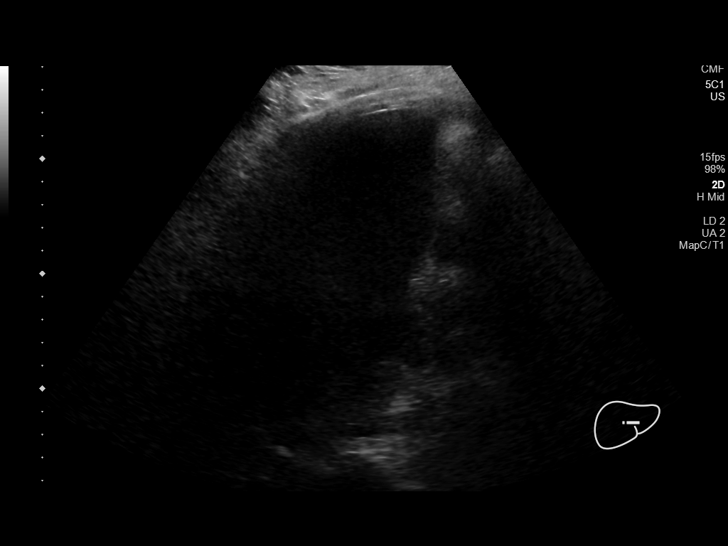
[im 13/33]
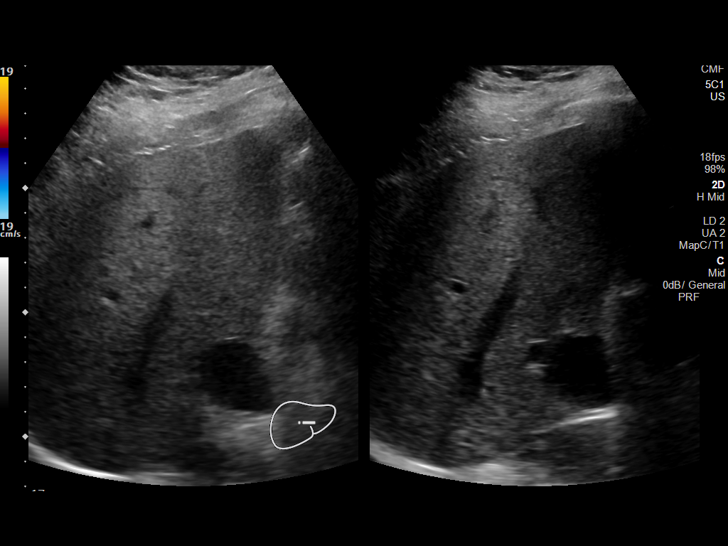
[im 15/33]
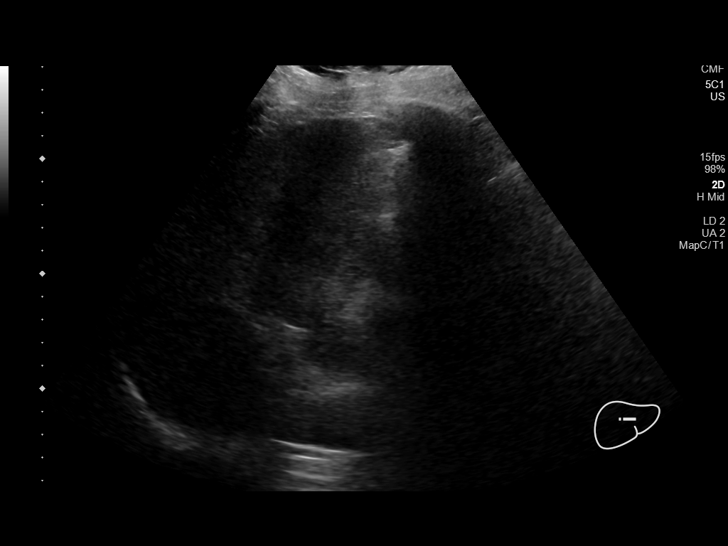
[im 18/33]
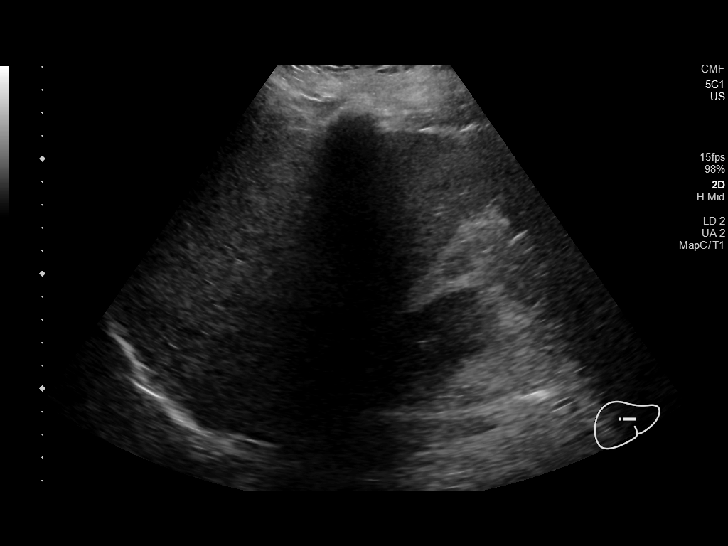
[im 21/33]
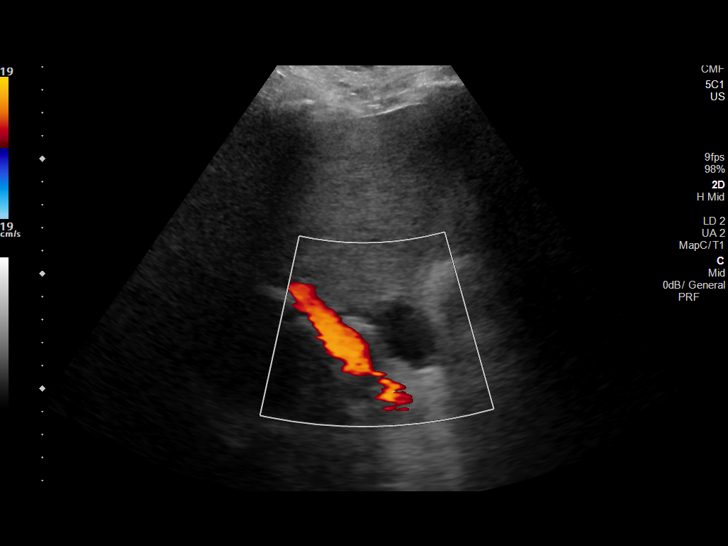
[im 23/33]
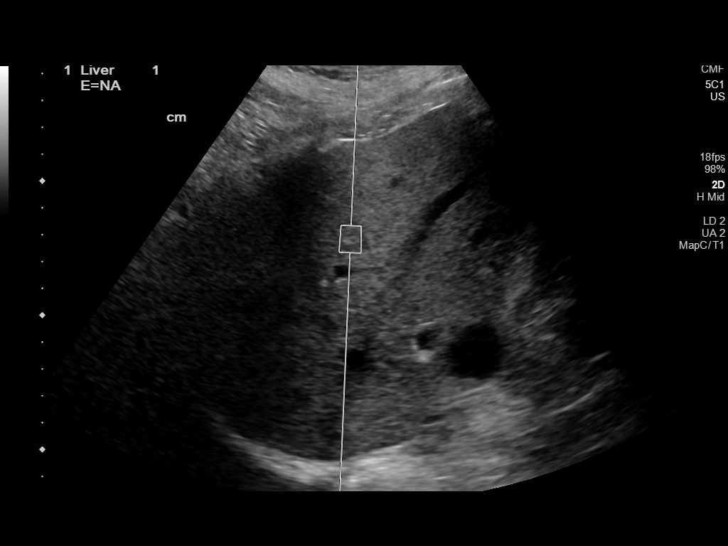
[im 26/33]
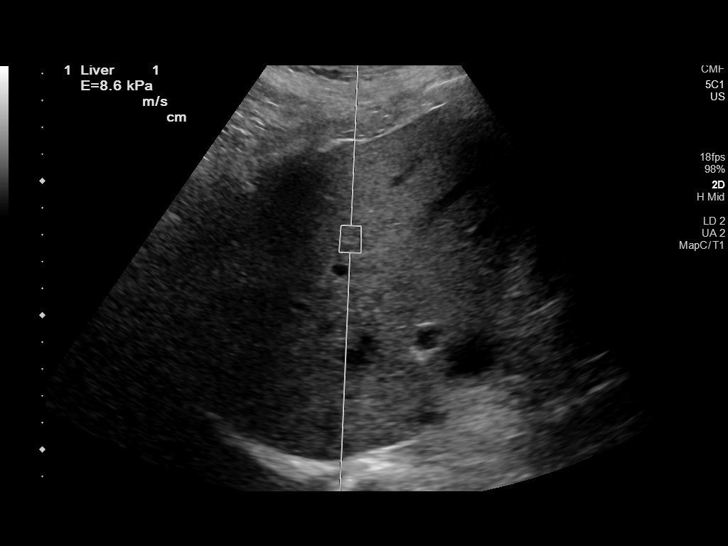
[im 29/33]
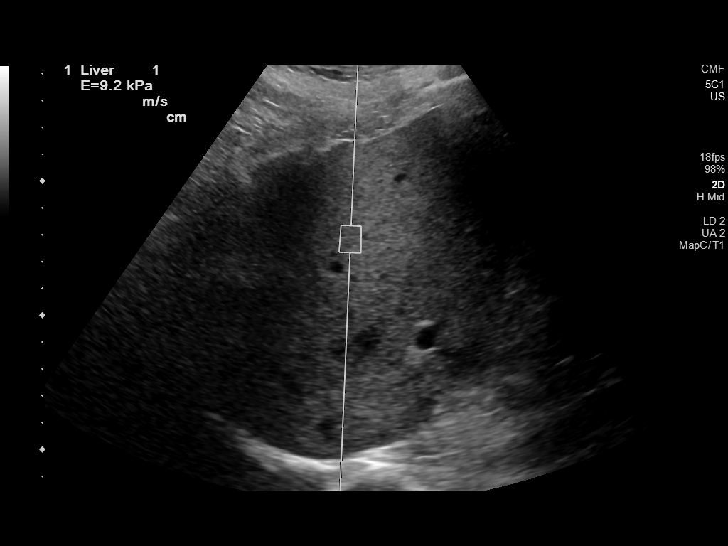
[im 31/33]
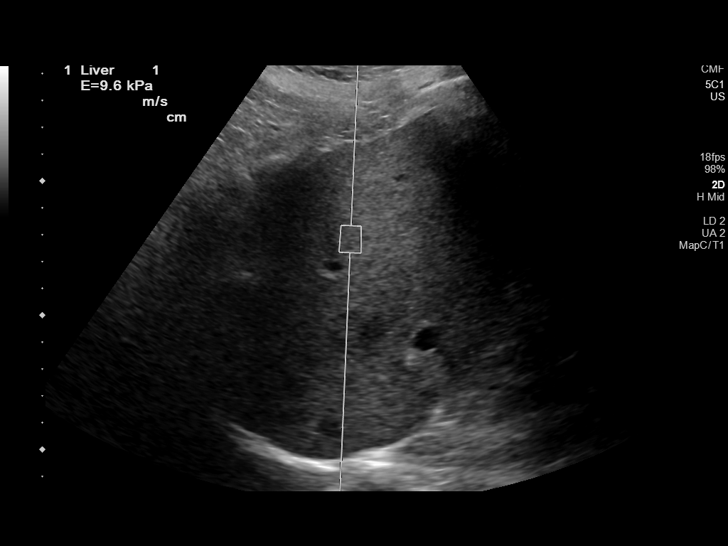

[12 of 25 positions shown; findings below may reference images not displayed]

FINDINGS: ULTRASOUND ABDOMEN LIMITED RIGHT UPPER QUADRANT

Gallbladder:

Surgically absent.  No biliary ductal dilation.

Liver:

2.8 cm hepatic cysts. Coarsened hepatic echotexture with diffusely
increased hepatic parenchymal echogenicity and subtle contour
nodularity. Portal vein is patent on color Doppler imaging with
normal direction of blood flow towards the liver.

ULTRASOUND HEPATIC ELASTOGRAPHY

Device: Siemens Helix VTQ

Patient position: Supine

Transducer [id number]

Number of measurements: 10

Hepatic segment:  7

Median kPa:

IQR:

IQR/Median kPa ratio:

Data quality:  Good

Diagnostic category: >9 kPa and ?13 kPa: suggestive of cACLD, but
needs further testing

The use of hepatic elastography is applicable to patients with viral
hepatitis and non-alcoholic fatty liver disease. At this time, there
is insufficient data for the referenced cut-off values and use in
other causes of liver disease, including alcoholic liver disease.
Patients, however, may be assessed by elastography and serve as
their own reference standard/baseline.

In patients with non-alcoholic liver disease, the values suggesting
compensated advanced chronic liver disease (cACLD) may be lower, and
patients may need additional testing with elasticity results of [DATE]
kPa.

Please note that abnormal hepatic elasticity and shear wave
velocities may also be identified in clinical settings other than
with hepatic fibrosis, such as: acute hepatitis, elevated right
heart and central venous pressures including use of beta blockers,
Tiger disease (Bertelsen), infiltrative processes such as
mastocytosis/amyloidosis/infiltrative tumor/lymphoma, extrahepatic
cholestasis, with hyperemia in the post-prandial state, and with
liver transplantation. Correlation with patient history, laboratory
data, and clinical condition recommended.

Diagnostic Categories:

< or =5 kPa: high probability of being normal

< or =9 kPa: in the absence of other known clinical signs, rules [DATE] kPa and ?13 kPa: suggestive of cACLD, but needs further testing

>13 kPa: highly suggestive of cACLD

> or =17 kPa: highly suggestive of cACLD with an increased
probability of clinically significant portal hypertension
IMPRESSION: ULTRASOUND RUQ:

Coarsened hepatic echotexture with increased hepatic echogenicity
and subtle contour nodularity suggestive hepatic steatosis with
possible cirrhosis.

ULTRASOUND HEPATIC ELASTOGRAPHY:

Median kPa:

Diagnostic category: >9 kPa and ?13 kPa: suggestive of cACLD, but
needs further testing

## 2022-05-15 ENCOUNTER — Other Ambulatory Visit: Payer: Self-pay | Admitting: Primary Care

## 2022-05-15 DIAGNOSIS — E1169 Type 2 diabetes mellitus with other specified complication: Secondary | ICD-10-CM

## 2022-05-27 ENCOUNTER — Other Ambulatory Visit (INDEPENDENT_AMBULATORY_CARE_PROVIDER_SITE_OTHER): Payer: Self-pay | Admitting: Family Medicine

## 2022-05-27 DIAGNOSIS — E1165 Type 2 diabetes mellitus with hyperglycemia: Secondary | ICD-10-CM

## 2022-06-02 ENCOUNTER — Other Ambulatory Visit: Payer: Self-pay | Admitting: Primary Care

## 2022-06-02 DIAGNOSIS — E7800 Pure hypercholesterolemia, unspecified: Secondary | ICD-10-CM

## 2022-06-02 DIAGNOSIS — E119 Type 2 diabetes mellitus without complications: Secondary | ICD-10-CM

## 2022-06-03 ENCOUNTER — Ambulatory Visit (INDEPENDENT_AMBULATORY_CARE_PROVIDER_SITE_OTHER): Payer: 59 | Admitting: Family Medicine

## 2022-06-03 ENCOUNTER — Encounter (INDEPENDENT_AMBULATORY_CARE_PROVIDER_SITE_OTHER): Payer: Self-pay | Admitting: Family Medicine

## 2022-06-03 VITALS — BP 99/68 | HR 77 | Temp 97.9°F | Ht 73.0 in | Wt 288.0 lb

## 2022-06-03 DIAGNOSIS — E669 Obesity, unspecified: Secondary | ICD-10-CM

## 2022-06-03 DIAGNOSIS — Z7984 Long term (current) use of oral hypoglycemic drugs: Secondary | ICD-10-CM

## 2022-06-03 DIAGNOSIS — E559 Vitamin D deficiency, unspecified: Secondary | ICD-10-CM

## 2022-06-03 DIAGNOSIS — E785 Hyperlipidemia, unspecified: Secondary | ICD-10-CM | POA: Diagnosis not present

## 2022-06-03 DIAGNOSIS — E1169 Type 2 diabetes mellitus with other specified complication: Secondary | ICD-10-CM | POA: Diagnosis not present

## 2022-06-03 DIAGNOSIS — Z6838 Body mass index (BMI) 38.0-38.9, adult: Secondary | ICD-10-CM

## 2022-06-03 DIAGNOSIS — E1165 Type 2 diabetes mellitus with hyperglycemia: Secondary | ICD-10-CM

## 2022-06-03 MED ORDER — VITAMIN D (ERGOCALCIFEROL) 1.25 MG (50000 UNIT) PO CAPS: 50000.0000 [IU] | ORAL_CAPSULE | ORAL | 0 refills | Status: DC

## 2022-06-03 MED ORDER — RYBELSUS 7 MG PO TABS: 7.0000 mg | ORAL_TABLET | Freq: Every day | ORAL | 0 refills | Status: DC

## 2022-06-04 NOTE — Progress Notes (Signed)
Chief Complaint:   OBESITY Antonio Horton is here to discuss his progress with his obesity treatment plan along with follow-up of his obesity related diagnoses. Antonio Horton is on the Category 4 Plan and states he is following his eating plan approximately 95% of the time. Antonio Horton states he is doing 0 minutes 0 times per week.  Today's visit was #: 12 Starting weight: 313 lbs Starting date: 09/25/2021 Today's weight: 288 lbs Today's date: 06/03/2022 Total lbs lost to date: 25 Total lbs lost since last in-office visit: 1  Interim History: Antonio Horton continues to work on his weight loss.  His hunger is mostly controlled, but he is getting a bit bored with his plan and would like more options especially for dinner.  Subjective:   1. Hyperlipidemia associated with type 2 diabetes mellitus (HCC) Antonio Horton is working on decreasing cholesterol in his diet and he is working on his weight loss.  He denies chest pain, and he is stable on Crestor.  2. Type 2 diabetes mellitus with hyperglycemia, without long-term current use of insulin (HCC) Antonio Horton is stable on Rybelsus.  He occasionally has some nausea especially if he goes too long in between meals.  3. Vitamin D deficiency Antonio Horton is stable on vitamin D, and he requests a refill today.  He is not at risk of over replacement.  Assessment/Plan:   1. Hyperlipidemia associated with type 2 diabetes mellitus (HCC) Antonio Horton will continue Crestor at 5 mg and he will continue with his diet.  We will continue to follow.  2. Type 2 diabetes mellitus with hyperglycemia, without long-term current use of insulin (HCC) Antonio Horton will continue Rybelsus, and we will refill for 1 month.  - Semaglutide (RYBELSUS) 7 MG TABS; Take 1 tablet (7 mg total) by mouth daily.  Dispense: 30 tablet; Refill: 0  3. Vitamin D deficiency Antonio Horton will continue prescription vitamin D, and we will refill for 1 month.  - Vitamin D, Ergocalciferol,  (DRISDOL) 1.25 MG (50000 UNIT) CAPS capsule; Take 1 capsule (50,000 Units total) by mouth every 7 (seven) days.  Dispense: 4 capsule; Refill: 0  4. BMI 38.0-38.9,adult  5. Obesity, Beginning BMI 41.56 Antonio Horton is currently in the action stage of change. As such, his goal is to continue with weight loss efforts. He has agreed to the Category 4 Plan and keeping a food journal and adhering to recommended goals of 500-700 calories and 50+ grams of protein at supper daily.   Antonio Horton was show how to journal using my fitness pal and he was given some recipes to help him meet his goals.  Behavioral modification strategies: increasing lean protein intake.  Antonio Horton has agreed to follow-up with our clinic in 4 weeks. He was informed of the importance of frequent follow-up visits to maximize his success with intensive lifestyle modifications for his multiple health conditions.   Objective:   Blood pressure 99/68, pulse 77, temperature 97.9 F (36.6 C), height 6\' 1"  (1.854 m), weight 288 lb (130.6 kg), SpO2 99 %. Body mass index is 38 kg/m.  Lab Results  Component Value Date   CREATININE 1.06 02/27/2022   BUN 21 02/27/2022   NA 142 02/27/2022   K 5.0 02/27/2022   CL 103 02/27/2022   CO2 24 02/27/2022   Lab Results  Component Value Date   ALT 23 02/27/2022   AST 16 02/27/2022   ALKPHOS 63 02/27/2022   BILITOT 0.5 02/27/2022   Lab Results  Component Value Date   HGBA1C 6.1 (H) 02/27/2022  HGBA1C 7.4 (H) 09/25/2021   HGBA1C 7.1 (A) 09/10/2021   HGBA1C 7.2 (H) 03/13/2021   HGBA1C 6.6 (H) 11/25/2020   Lab Results  Component Value Date   INSULIN 18.0 02/27/2022   INSULIN 41.9 (H) 09/25/2021   Lab Results  Component Value Date   TSH 1.350 09/25/2021   Lab Results  Component Value Date   CHOL 122 02/27/2022   HDL 42 02/27/2022   LDLCALC 66 02/27/2022   TRIG 69 02/27/2022   CHOLHDL 3 03/13/2021   Lab Results  Component Value Date   VD25OH 41.5 02/27/2022   VD25OH  19.9 (L) 09/25/2021   Lab Results  Component Value Date   WBC 6.0 10/07/2021   HGB 15.1 10/07/2021   HCT 44.5 10/07/2021   MCV 89.5 10/07/2021   PLT 216.0 10/07/2021   Lab Results  Component Value Date   IRON 94 04/12/2021   FERRITIN 278.7 04/12/2021   Attestation Statements:   Reviewed by clinician on day of visit: allergies, medications, problem list, medical history, surgical history, family history, social history, and previous encounter notes.  I have personally spent 40 minutes total time today in preparation, patient care, and documentation for this visit, including the following: review of clinical lab tests; review of medical tests/procedures/services.   I, Burt Knack, am acting as transcriptionist for Quillian Quince, MD.  I have reviewed the above documentation for accuracy and completeness, and I agree with the above. -  Quillian Quince, MD

## 2022-06-26 ENCOUNTER — Other Ambulatory Visit: Payer: Self-pay | Admitting: Thoracic Surgery (Cardiothoracic Vascular Surgery)

## 2022-06-26 DIAGNOSIS — I7121 Aneurysm of the ascending aorta, without rupture: Secondary | ICD-10-CM

## 2022-07-02 ENCOUNTER — Encounter (INDEPENDENT_AMBULATORY_CARE_PROVIDER_SITE_OTHER): Payer: Self-pay | Admitting: Family Medicine

## 2022-07-02 ENCOUNTER — Ambulatory Visit (INDEPENDENT_AMBULATORY_CARE_PROVIDER_SITE_OTHER): Payer: 59 | Admitting: Family Medicine

## 2022-07-02 VITALS — BP 118/76 | HR 77 | Temp 97.9°F | Ht 73.0 in | Wt 288.0 lb

## 2022-07-02 DIAGNOSIS — Z6838 Body mass index (BMI) 38.0-38.9, adult: Secondary | ICD-10-CM | POA: Diagnosis not present

## 2022-07-02 DIAGNOSIS — E1165 Type 2 diabetes mellitus with hyperglycemia: Secondary | ICD-10-CM | POA: Diagnosis not present

## 2022-07-02 DIAGNOSIS — E669 Obesity, unspecified: Secondary | ICD-10-CM

## 2022-07-02 DIAGNOSIS — E559 Vitamin D deficiency, unspecified: Secondary | ICD-10-CM | POA: Diagnosis not present

## 2022-07-02 DIAGNOSIS — Z7984 Long term (current) use of oral hypoglycemic drugs: Secondary | ICD-10-CM

## 2022-07-02 MED ORDER — VITAMIN D (ERGOCALCIFEROL) 1.25 MG (50000 UNIT) PO CAPS: 50000.0000 [IU] | ORAL_CAPSULE | ORAL | 0 refills | Status: DC

## 2022-07-02 MED ORDER — RYBELSUS 7 MG PO TABS: 7.0000 mg | ORAL_TABLET | Freq: Every day | ORAL | 0 refills | Status: DC

## 2022-07-07 NOTE — Progress Notes (Unsigned)
Chief Complaint:   OBESITY Antonio Horton is here to discuss his progress with his obesity treatment plan along with follow-up of his obesity related diagnoses. Antonio Horton is on the Category 4 Plan and keeping a food journal and adhering to recommended goals of 500-700 calories and 50+ grams of protein at supper and states he is following his eating plan approximately 95% of the time. Antonio Horton states he is doing yard work for 15 minutes 3 times per week.  Today's visit was #: 13 Starting weight: 313 lbs Starting date: 09/25/2021 Today's weight: 288 lbs Today's date: 07/02/2022 Total lbs lost to date: 25 Total lbs lost since last in-office visit: 0  Interim History: Patient has done well with maintaining his weight.  He is working on Orthoptist and he is doing well but may not be meeting all of his nutritional requirements.  Subjective:   1. Type 2 diabetes mellitus with hyperglycemia, without long-term current use of insulin (HCC) Patient is doing well on Rybelsus, and he notes decreased polyphagia.  2. Vitamin D deficiency Patient's vitamin D level is improving but not yet at goal.  Assessment/Plan:   1. Type 2 diabetes mellitus with hyperglycemia, without long-term current use of insulin (HCC) Patient will continue Rybelsus 7 mg, and we will refill for 1 month.  - Semaglutide (RYBELSUS) 7 MG TABS; Take 1 tablet (7 mg total) by mouth daily.  Dispense: 30 tablet; Refill: 0  2. Vitamin D deficiency Patient will continue prescription vitamin D, and we will refill for 1 month.  - Vitamin D, Ergocalciferol, (DRISDOL) 1.25 MG (50000 UNIT) CAPS capsule; Take 1 capsule (50,000 Units total) by mouth every 7 (seven) days.  Dispense: 4 capsule; Refill: 0  3. BMI 38.0-38.9,adult  4. Obesity, Beginning BMI 41.56 Antonio Horton is currently in the action stage of change. As such, his goal is to continue with weight loss efforts. He has agreed to change to keeping a food journal and  adhering to recommended goals of 1500-1800 calories and 120+ grams of protein daily.   Exercise goals: As is.   Behavioral modification strategies: increasing lean protein intake and no skipping meals.  Antonio Horton has agreed to follow-up with our clinic in 4 weeks. He was informed of the importance of frequent follow-up visits to maximize his success with intensive lifestyle modifications for his multiple health conditions.   Objective:   Blood pressure 118/76, pulse 77, temperature 97.9 F (36.6 C), height 6\' 1"  (1.854 m), weight 288 lb (130.6 kg), SpO2 97 %. Body mass index is 38 kg/m.  Lab Results  Component Value Date   CREATININE 1.06 02/27/2022   BUN 21 02/27/2022   NA 142 02/27/2022   K 5.0 02/27/2022   CL 103 02/27/2022   CO2 24 02/27/2022   Lab Results  Component Value Date   ALT 23 02/27/2022   AST 16 02/27/2022   ALKPHOS 63 02/27/2022   BILITOT 0.5 02/27/2022   Lab Results  Component Value Date   HGBA1C 6.1 (H) 02/27/2022   HGBA1C 7.4 (H) 09/25/2021   HGBA1C 7.1 (A) 09/10/2021   HGBA1C 7.2 (H) 03/13/2021   HGBA1C 6.6 (H) 11/25/2020   Lab Results  Component Value Date   INSULIN 18.0 02/27/2022   INSULIN 41.9 (H) 09/25/2021   Lab Results  Component Value Date   TSH 1.350 09/25/2021   Lab Results  Component Value Date   CHOL 122 02/27/2022   HDL 42 02/27/2022   LDLCALC 66 02/27/2022   TRIG 69 02/27/2022  CHOLHDL 3 03/13/2021   Lab Results  Component Value Date   VD25OH 41.5 02/27/2022   VD25OH 19.9 (L) 09/25/2021   Lab Results  Component Value Date   WBC 6.0 10/07/2021   HGB 15.1 10/07/2021   HCT 44.5 10/07/2021   MCV 89.5 10/07/2021   PLT 216.0 10/07/2021   Lab Results  Component Value Date   IRON 94 04/12/2021   FERRITIN 278.7 04/12/2021   Attestation Statements:   Reviewed by clinician on day of visit: allergies, medications, problem list, medical history, surgical history, family history, social history, and previous encounter  notes.   I, Burt Knack, am acting as transcriptionist for Quillian Quince, MD.  I have reviewed the above documentation for accuracy and completeness, and I agree with the above. -  Quillian Quince, MD

## 2022-07-09 ENCOUNTER — Emergency Department (HOSPITAL_BASED_OUTPATIENT_CLINIC_OR_DEPARTMENT_OTHER): Payer: 59

## 2022-07-09 ENCOUNTER — Emergency Department (HOSPITAL_BASED_OUTPATIENT_CLINIC_OR_DEPARTMENT_OTHER)
Admission: EM | Admit: 2022-07-09 | Discharge: 2022-07-09 | Disposition: A | Discharge status: Home / Self Care | Payer: 59 | Attending: Emergency Medicine | Admitting: Emergency Medicine

## 2022-07-09 ENCOUNTER — Other Ambulatory Visit: Payer: Self-pay

## 2022-07-09 ENCOUNTER — Encounter (HOSPITAL_BASED_OUTPATIENT_CLINIC_OR_DEPARTMENT_OTHER): Payer: Self-pay | Admitting: Emergency Medicine

## 2022-07-09 DIAGNOSIS — Z7984 Long term (current) use of oral hypoglycemic drugs: Secondary | ICD-10-CM | POA: Insufficient documentation

## 2022-07-09 DIAGNOSIS — R1010 Upper abdominal pain, unspecified: Secondary | ICD-10-CM | POA: Diagnosis present

## 2022-07-09 DIAGNOSIS — A084 Viral intestinal infection, unspecified: Secondary | ICD-10-CM | POA: Insufficient documentation

## 2022-07-09 DIAGNOSIS — E119 Type 2 diabetes mellitus without complications: Secondary | ICD-10-CM | POA: Diagnosis not present

## 2022-07-09 LAB — URINALYSIS, ROUTINE W REFLEX MICROSCOPIC
Bilirubin Urine: NEGATIVE
Glucose, UA: NEGATIVE mg/dL
Hgb urine dipstick: NEGATIVE
Ketones, ur: NEGATIVE mg/dL
Leukocytes,Ua: NEGATIVE
Nitrite: NEGATIVE
Specific Gravity, Urine: 1.03 (ref 1.005–1.030)
pH: 6 (ref 5.0–8.0)

## 2022-07-09 LAB — CBC
HCT: 47.4 % (ref 39.0–52.0)
Hemoglobin: 16.2 g/dL (ref 13.0–17.0)
MCH: 29.8 pg (ref 26.0–34.0)
MCHC: 34.2 g/dL (ref 30.0–36.0)
MCV: 87.3 fL (ref 80.0–100.0)
Platelets: 269 10*3/uL (ref 150–400)
RBC: 5.43 MIL/uL (ref 4.22–5.81)
RDW: 13.2 % (ref 11.5–15.5)
WBC: 9.9 10*3/uL (ref 4.0–10.5)
nRBC: 0 % (ref 0.0–0.2)

## 2022-07-09 LAB — COMPREHENSIVE METABOLIC PANEL
ALT: 18 U/L (ref 0–44)
AST: 16 U/L (ref 15–41)
Albumin: 4.4 g/dL (ref 3.5–5.0)
Alkaline Phosphatase: 42 U/L (ref 38–126)
Anion gap: 10 (ref 5–15)
BUN: 13 mg/dL (ref 6–20)
CO2: 25 mmol/L (ref 22–32)
Calcium: 9.6 mg/dL (ref 8.9–10.3)
Chloride: 101 mmol/L (ref 98–111)
Creatinine, Ser: 0.94 mg/dL (ref 0.61–1.24)
GFR, Estimated: >60 mL/min (ref >60)
Glucose, Bld: 138 mg/dL — ABNORMAL HIGH (ref 70–99)
Potassium: 3.9 mmol/L (ref 3.5–5.1)
Sodium: 136 mmol/L (ref 135–145)
Total Bilirubin: 1.1 mg/dL (ref 0.3–1.2)
Total Protein: 7.6 g/dL (ref 6.5–8.1)

## 2022-07-09 LAB — LIPASE, BLOOD: Lipase: 26 U/L (ref 11–51)

## 2022-07-09 MED ORDER — FAMOTIDINE IN NACL 20-0.9 MG/50ML-% IV SOLN
20.0000 mg | Freq: Once | INTRAVENOUS | Status: AC
  Administered 2022-07-09: 20 mg via INTRAVENOUS
  Filled 2022-07-09: qty 50

## 2022-07-09 MED ORDER — ALUM & MAG HYDROXIDE-SIMETH 200-200-20 MG/5ML PO SUSP
30.0000 mL | Freq: Once | ORAL | Status: AC
  Administered 2022-07-09: 30 mL via ORAL
  Filled 2022-07-09: qty 30

## 2022-07-09 MED ORDER — IOHEXOL 300 MG/ML  SOLN
100.0000 mL | Freq: Once | INTRAMUSCULAR | Status: AC | PRN
  Administered 2022-07-09: 100 mL via INTRAVENOUS

## 2022-07-09 MED ORDER — ONDANSETRON HCL 4 MG PO TABS: 4.0000 mg | ORAL_TABLET | Freq: Three times a day (TID) | ORAL | 0 refills | Status: DC | PRN

## 2022-07-09 MED ORDER — SUCRALFATE 1 GM/10ML PO SUSP: 1.0000 g | Freq: Three times a day (TID) | ORAL | 0 refills | Status: DC

## 2022-07-09 MED ORDER — PANTOPRAZOLE SODIUM 40 MG PO TBEC: 40.0000 mg | DELAYED_RELEASE_TABLET | Freq: Every day | ORAL | 0 refills | Status: DC

## 2022-07-09 MED ORDER — ONDANSETRON HCL 4 MG/2ML IJ SOLN
4.0000 mg | Freq: Once | INTRAMUSCULAR | Status: AC
  Administered 2022-07-09: 4 mg via INTRAVENOUS
  Filled 2022-07-09: qty 2

## 2022-07-09 MED ORDER — SODIUM CHLORIDE 0.9 % IV BOLUS
1000.0000 mL | Freq: Once | INTRAVENOUS | Status: AC
  Administered 2022-07-09: 1000 mL via INTRAVENOUS

## 2022-07-09 NOTE — ED Notes (Signed)
Patient transported to CT 

## 2022-07-09 NOTE — ED Provider Notes (Signed)
[place] EMERGENCY DEPARTMENT AT [facility] Provider Note   CSN: [id number] Arrival date & time: 07/09/22  1258     History  Chief Complaint  Patient presents with   Abdominal Pain    Antonio Horton is a 57 y.o. male with a PMHx of DM, who presents to the ED with concerns for upper abdominal pain x 3 days. Has associated nausea, vomiting, bloating. No meds tried at home. Denies chest pain, shortness of breath, urinary symptoms, fever.   The history is provided by the patient. No language interpreter was used.       Home Medications Prior to Admission medications   Medication Sig Start Date End Date Taking? Authorizing Provider  ondansetron (ZOFRAN) 4 MG tablet Take 1 tablet (4 mg total) by mouth every 8 (eight) hours as needed for nausea or vomiting. 07/09/22  Yes Elanor Cale A, PA-C  pantoprazole (PROTONIX) 40 MG tablet Take 1 tablet (40 mg total) by mouth daily. 07/09/22  Yes Jamileth Putzier A, PA-C  sucralfate (CARAFATE) 1 GM/10ML suspension Take 10 mLs (1 g total) by mouth 4 (four) times daily -  with meals and at bedtime. 07/09/22  Yes Blimy Napoleon A, PA-C  Continuous Blood Gluc Receiver (FREESTYLE LIBRE 14 DAY READER) DEVI FreeStyle Libre 14 Day Reader 1 DEVICE BY DOES NOT APPLY ROUTE 3 (THREE) TIMES DAILY AS NEEDED. 05/13/21   [provider]  Continuous Glucose Sensor (FREESTYLE LIBRE 3 SENSOR) MISC PLACE 1 SENSOR ON THE SKIN EVERY 14 DAYS. USE TO CHECK GLUCOSE CONTINUOUSLY 05/15/22   Doreene Nest, NP  lisinopril-hydrochlorothiazide (ZESTORETIC) 10-12.5 MG tablet TAKE 1 TABLET BY MOUTH EVERY DAY FOR BLOOD PRESSURE 03/24/22   Doreene Nest, NP  metFORMIN (GLUCOPHAGE-XR) 500 MG 24 hr tablet TAKE 2 TABLETS (1,000 MG TOTAL) BY MOUTH DAILY WITH BREAKFAST. FOR DIABETES. 06/02/22   Doreene Nest, NP  prednisoLONE acetate (PRED FORTE) 1 % ophthalmic suspension Place 1 drop into the left eye daily.  12/31/18   [provider]  rosuvastatin  (CRESTOR) 5 MG tablet TAKE 1 TABLET (5 MG TOTAL) BY MOUTH DAILY FOR CHOLESTEROL 06/02/22   Doreene Nest, NP  Semaglutide (RYBELSUS) 7 MG TABS Take 1 tablet (7 mg total) by mouth daily. 07/02/22   Quillian Quince D, MD  Vitamin D, Ergocalciferol, (DRISDOL) 1.25 MG (50000 UNIT) CAPS capsule Take 1 capsule (50,000 Units total) by mouth every 7 (seven) days. 07/02/22   Wilder Glade, MD      Allergies    Quinolones    Review of Systems   Review of Systems  Gastrointestinal:  Positive for abdominal pain.  All other systems reviewed and are negative.   Physical Exam Updated Vital Signs BP (!) 114/91 (BP Location: Right Arm)   Pulse (!) 104   Temp 97.7 F (36.5 C) (Temporal)   Resp 15   SpO2 97%  Physical Exam Vitals and nursing note reviewed.  Constitutional:      General: He is not in acute distress.    Appearance: He is not diaphoretic.  HENT:     Head: Normocephalic and atraumatic.     Mouth/Throat:     Pharynx: No oropharyngeal exudate.  Eyes:     General: No scleral icterus.    Conjunctiva/sclera: Conjunctivae normal.  Cardiovascular:     Rate and Rhythm: Normal rate and regular rhythm.     Pulses: Normal pulses.     Heart sounds: Normal heart sounds.  Pulmonary:     Effort:  Pulmonary effort is normal. No respiratory distress.     Breath sounds: Normal breath sounds. No wheezing.  Abdominal:     General: Bowel sounds are normal.     Palpations: Abdomen is soft. There is no mass.     Tenderness: There is abdominal tenderness in the right upper quadrant, epigastric area and periumbilical area. There is no guarding or rebound.     Comments: Epigastric, RUQ and periumbilical abdominal TTP. No appreciable guarding noted.  Musculoskeletal:        General: Normal range of motion.     Cervical back: Normal range of motion and neck supple.  Skin:    General: Skin is warm and dry.  Neurological:     Mental Status: He is alert.  Psychiatric:        Behavior: Behavior  normal.     ED Results / Procedures / Treatments   Labs (all labs ordered are listed, but only abnormal results are displayed) Labs Reviewed  COMPREHENSIVE METABOLIC PANEL - Abnormal; Notable for the following components:      Result Value   Glucose, Bld 138 (*)    All other components within normal limits  URINALYSIS, ROUTINE W REFLEX MICROSCOPIC - Abnormal; Notable for the following components:   Protein, ur TRACE (*)    All other components within normal limits  LIPASE, BLOOD  CBC    EKG EKG Interpretation  Date/Time:  Wednesday July 09 2022 15:14:57 EDT Ventricular Rate:  88 PR Interval:  180 QRS Duration: 87 QT Interval:  350 QTC Calculation: 424 R Axis:   -20 Text Interpretation: Sinus rhythm Inferior infarct, old Probable anterolateral infarct, old [id number] is not new No significant change since last tracing Confirmed by Derwood Kaplan [phone]) on 07/09/2022 4:49:50 PM  Radiology CT ABDOMEN PELVIS W CONTRAST  Result Date: 07/09/2022 CLINICAL DATA:  Abdomen pain vomiting EXAM: CT ABDOMEN AND PELVIS WITH CONTRAST TECHNIQUE: Multidetector CT imaging of the abdomen and pelvis was performed using the standard protocol following bolus administration of intravenous contrast. RADIATION DOSE REDUCTION: This exam was performed according to the departmental dose-optimization program which includes automated exposure control, adjustment of the mA and/or kV according to patient size and/or use of iterative reconstruction technique. CONTRAST:  OMNIPAQUE IOHEXOL 300 MG/ML  SOLN COMPARISON:  CT 11/24/2020, MRI 11/25/2020 FINDINGS: Lower chest: Lung bases demonstrate no acute airspace disease. Ascending aortic aneurysm measuring up to 5 cm. Hepatobiliary: Cyst in the right hepatic lobe. Status post cholecystectomy. No biliary dilatation. Pancreas: Unremarkable. No pancreatic ductal dilatation or surrounding inflammatory changes. Spleen: Normal in size without focal abnormality.  Adrenals/Urinary Tract: Adrenal glands are unremarkable. Kidneys are normal, without renal calculi, focal lesion, or hydronephrosis. Bladder is unremarkable. Stomach/Bowel: The stomach is nonenlarged. Fluid-filled nondistended small bowel without convincing obstruction, or wall thickening. Vascular/Lymphatic: Nonaneurysmal abdominal aorta. No suspicious lymph nodes Reproductive: Prostate is unremarkable. Other: Negative for pelvic effusion or free air. Slight swirling appearance of mesentery and vasculature on sagittal images, series 6 image 49 to 70 but no convincing obstruction, mesenteric vascular congestion or bowel wall thickening. Musculoskeletal: Multilevel degenerative change. No acute osseous abnormality IMPRESSION: 1. No definite CT evidence for acute intra-abdominal abnormality. Fluid-filled nondistended small bowel, nonspecific, question enteritis 2. Aneurysmal dilatation of partially visualized ascending aorta up to 5 cm. Reference CT chest report 01/23/2022 Electronically Signed   By: Jasmine Pang M.D.   On: 07/09/2022 16:03    Procedures Procedures    Medications Ordered in ED Medications  sodium chloride 0.9 %  bolus 1,000 mL (0 mLs Intravenous Stopped 07/09/22 1630)  ondansetron (ZOFRAN) injection 4 mg (4 mg Intravenous Given 07/09/22 1507)  famotidine (PEPCID) IVPB 20 mg premix (0 mg Intravenous Stopped 07/09/22 1629)  alum & mag hydroxide-simeth (MAALOX/MYLANTA) 200-200-20 MG/5ML suspension 30 mL (30 mLs Oral Given 07/09/22 1509)  iohexol (OMNIPAQUE) 300 MG/ML solution 100 mL (100 mLs Intravenous Contrast Given 07/09/22 1519)    ED Course/ Medical Decision Making/ A&P Clinical Course as of 07/09/22 1918  Wed Jul 09, 2022  1630 Pt re-evaluated and noted that his symptoms improved to 2/10 with treatment regimen in the ED.  [SB]  1759 Re-evaluated and noted improvement of symptoms with treatment regimen. Discussed discharge treatment plan. Pt agreeable at this time. Pt appears safe for  discharge. [SB]  1835 RN notified that patient tolerated PO challenge in ED.  [SB]    Clinical Course User Index [SB] Yousra Ivens A, PA-C                             Medical Decision Making Amount and/or Complexity of Data Reviewed Labs: ordered. Radiology: ordered.  Risk OTC drugs. Prescription drug management.   Patient presents to the emergency department with concerns for upper abdominal pain x 3 days.  Has a history of diabetes and gastroparesis.  Patient afebrile.  On exam patient with upper abdominal tenderness to palpation.  Remainder of exam without acute findings.  Differential diagnosis includes cholecystitis, pancreatitis, viral etiology, diverticulitis, gastroparesis exacerbation, GERD.   Additional history obtained:  Additional history obtained from Spouse/Significant Other  Labs:  I ordered, and personally interpreted labs.  The pertinent results include:  Lipase, CBC unremarkable Urinalysis unremarkable CMP with elevated glucose at 130 otherwise unremarkable  Imaging: I ordered imaging studies including CT abdomen pelvis I independently visualized and interpreted imaging which showed  1. No definite CT evidence for acute intra-abdominal abnormality.  Fluid-filled nondistended small bowel, nonspecific, question  enteritis  2. Aneurysmal dilatation of partially visualized ascending aorta up  to 5 cm. Reference CT chest report 01/23/2022   I agree with the radiologist interpretation  Medications:  I ordered medication including IV fluids, Zofran, Pepcid, GI cocktail for symptom management.  Reevaluation of the patient after these medicines and interventions, I reevaluated the patient and found that they have improved I have reviewed the patients home medicines and have made adjustments as needed Tolerated PO challenge in ED with above treatment regimen    Disposition: Presenting suspicious for likely viral gastroenteritis as cause of patient's symptoms.   Doubt concerns at this time for gastroparesis exacerbation.  Doubt concerns for diverticulitis, pancreatitis, cholecystitis,.  Also notable for likely GERD as cause of patient's symptoms.  Instructed patient to follow-up with his provider who prescribed in the semaglutide to discuss medication management.. After consideration of the diagnostic results and the patients response to treatment, I feel that the patient would benefit from Discharge home.  Discharged home with prescription for Zofran, Protonix, Carafate.  Patient instructed to follow-up with his gastroenterologist at [facility].  Work note provided.  Supportive care measures and strict return precautions discussed with patient at bedside. Pt acknowledges and verbalizes understanding. Pt appears safe for discharge. Follow up as indicated in discharge paperwork.   This chart was dictated using voice recognition software, Dragon. Despite the best efforts of this provider to proofread and correct errors, errors may still occur which can change documentation meaning.   Final Clinical Impression(s) / ED Diagnoses  Final diagnoses:  Pain of upper abdomen  Viral gastroenteritis    Rx / DC Orders ED Discharge Orders          Ordered    ondansetron (ZOFRAN) 4 MG tablet  Every 8 hours PRN        07/09/22 1835    pantoprazole (PROTONIX) 40 MG tablet  Daily        07/09/22 1835    sucralfate (CARAFATE) 1 GM/10ML suspension  3 times daily with meals & bedtime        07/09/22 1835              Celie Desrochers A, PA-C 07/09/22 1919    Rexford Maus, DO 07/10/22 [id number]

## 2022-07-09 NOTE — ED Triage Notes (Signed)
Pt arrives to ED with c/o abdominal pain x3 days. Associated withy vomiting, bloating.

## 2022-07-09 NOTE — Discharge Instructions (Addendum)
It was a pleasure taking care of you today!  Your lab and imaging studies did not show any concerning emergent findings at this time.  Your CT scan showed concerns for enteritis or viral GI bug.  You will be sent a prescription for Zofran, take as directed if you are experiencing nausea/vomiting.  If you have to take Zofran, wait at least 30 minutes before you attempt small sips/small bites of food/fluid.  Ensure to maintain fluid intake with water, tea, broth, soup, Pedialyte, Gatorade.  Follow-up with your primary care provider as needed regarding this ED visit.  Call your GI specialist at [facility] and set up a follow-up appointment regarding today's ED visit.  Return to the emergency department if you experience increasing/worsening symptoms.

## 2022-07-09 NOTE — ED Notes (Signed)
Patient given applesauce and water for PO challenege

## 2022-07-09 NOTE — ED Notes (Signed)
Patient verbalizes understanding of discharge instructions. Opportunity for questioning and answers were provided. Patient discharged from ED.  °

## 2022-07-10 ENCOUNTER — Telehealth: Payer: Self-pay

## 2022-07-10 NOTE — Transitions of Care (Post Inpatient/ED Visit) (Signed)
   07/10/2022  Name: Viviano Fladger MRN: [id number] DOB: 23-May-1965  Today's TOC FU Call Status: Today's TOC FU Call Status:: Unsuccessul Call (1st Attempt) Unsuccessful Call (1st Attempt) Date: 07/10/22  Attempted to reach the patient regarding the most recent Inpatient/ED visit.  Follow Up Plan: Additional outreach attempts will be made to reach the patient to complete the Transitions of Care (Post Inpatient/ED visit) call.   Signature Karena Addison, LPN [facility] Nurse Health Advisor Direct Dial [phone]

## 2022-07-11 NOTE — Transitions of Care (Post Inpatient/ED Visit) (Signed)
Unable to reach pt by phone; no v/m set up.      07/11/2022  Name: Antonio Horton MRN: [id number] DOB: Aug 29, 1965  Today's TOC FU Call Status: Today's TOC FU Call Status:: Unsuccessful Call (2nd Attempt) Unsuccessful Call (1st Attempt) Date: 07/10/22 Unsuccessful Call (2nd Attempt) Date: 07/11/22  Attempted to reach the patient regarding the most recent Inpatient/ED visit.  Follow Up Plan: Additional outreach attempts will be made to reach the patient to complete the Transitions of Care (Post Inpatient/ED visit) call.   Signature  Lewanda Rife, LPN

## 2022-07-14 NOTE — Transitions of Care (Post Inpatient/ED Visit) (Signed)
Unable to reach pt by phone; no answer and no v/m set up.pt was seen [facility] on 07/09/22 and pt was given ondansetron 4 mg, pantoprazole 40 mg and sucralfate 1 gm. Pt was to FU with GI. Sending note to Allayne Gitelman NP.     07/14/2022  Name: Antonio Horton MRN: [id number] DOB: 1965/12/01  Today's TOC FU Call Status: Today's TOC FU Call Status:: Unsuccessful Call (3rd Attempt) Unsuccessful Call (1st Attempt) Date: 07/10/22 Unsuccessful Call (2nd Attempt) Date: 07/11/22 Unsuccessful Call (3rd Attempt) Date: 07/14/22  Attempted to reach the patient regarding the most recent Inpatient/ED visit.  Follow Up Plan: No further outreach attempts will be made at this time. We have been unable to contact the patient.  Signature  Lewanda Rife, LPN

## 2022-07-14 NOTE — Telephone Encounter (Signed)
Noted  

## 2022-07-24 ENCOUNTER — Other Ambulatory Visit (INDEPENDENT_AMBULATORY_CARE_PROVIDER_SITE_OTHER): Payer: Self-pay | Admitting: Family Medicine

## 2022-07-24 DIAGNOSIS — E559 Vitamin D deficiency, unspecified: Secondary | ICD-10-CM

## 2022-08-01 ENCOUNTER — Ambulatory Visit
Admission: RE | Admit: 2022-08-01 | Discharge: 2022-08-01 | Disposition: A | Discharge status: Home / Self Care | Payer: 59 | Source: Ambulatory Visit | Attending: Thoracic Surgery (Cardiothoracic Vascular Surgery) | Admitting: Thoracic Surgery (Cardiothoracic Vascular Surgery)

## 2022-08-01 DIAGNOSIS — I7121 Aneurysm of the ascending aorta, without rupture: Secondary | ICD-10-CM

## 2022-08-01 MED ORDER — IOPAMIDOL (ISOVUE-370) INJECTION 76%
75.0000 mL | Freq: Once | INTRAVENOUS | Status: AC | PRN
  Administered 2022-08-01: 75 mL via INTRAVENOUS

## 2022-08-05 ENCOUNTER — Ambulatory Visit (INDEPENDENT_AMBULATORY_CARE_PROVIDER_SITE_OTHER): Payer: 59 | Admitting: Thoracic Surgery (Cardiothoracic Vascular Surgery)

## 2022-08-05 ENCOUNTER — Encounter: Payer: Self-pay | Admitting: Thoracic Surgery (Cardiothoracic Vascular Surgery)

## 2022-08-05 VITALS — BP 111/77 | HR 71 | Resp 18 | Ht 73.0 in | Wt 292.0 lb

## 2022-08-05 DIAGNOSIS — I7121 Aneurysm of the ascending aorta, without rupture: Secondary | ICD-10-CM

## 2022-08-05 NOTE — Progress Notes (Signed)
[address]       [name] [address]             [phone]     HPI: Antonio Horton returns for a scheduled follow-up visit regarding his ascending aneurysm.  Antonio Horton is a 57 year old man with a history of morbid obesity, borderline hypertension, hyperlipidemia, sleep apnea, type 2 diabetes without complication, vasovagal syncope, and an ascending aortic aneurysm.  He was noted to have a 5.2 cm aneurysm after presenting with a syncopal episode November 2022.  He has been followed since then.  The aneurysm has remained stable around 5 cm.  He had significant weight loss of 25 to 30 pounds prior to his visit in January.  He says since then his weights been relatively stable.  Feels well.  No chest pain, pressure, or tightness.  No swelling in his legs.  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   Borderline diabetes    Cholelithiases 11/25/2020   COVID-19 virus infection 06/14/2020   Diabetes mellitus without complication (HCC)    Hepatic steatosis 12/05/2020   Hyperlipidemia    Hypertension    Hypoxemia 11/25/2020   S/P laparoscopic cholecystectomy 11/27/2020   Sepsis (HCC) 11/25/2020   Sleep apnea    wears CPAP   SOBOE (shortness of breath on exertion) 09/25/2021   Syncope 11/25/2020   Vasovagal reaction     Current Outpatient Medications  Medication Sig Dispense Refill   Continuous Blood Gluc Receiver (FREESTYLE LIBRE 14 DAY READER) DEVI FreeStyle Libre 14 Day Reader 1 DEVICE BY DOES NOT APPLY ROUTE 3 (THREE) TIMES DAILY AS NEEDED.     Continuous Glucose Sensor (FREESTYLE LIBRE 3 SENSOR) MISC PLACE 1 SENSOR ON THE SKIN EVERY 14 DAYS. USE TO CHECK GLUCOSE CONTINUOUSLY 4 each 2   lisinopril-hydrochlorothiazide (ZESTORETIC) 10-12.5 MG tablet TAKE 1 TABLET BY MOUTH EVERY DAY FOR BLOOD PRESSURE 90 tablet 3   metFORMIN (GLUCOPHAGE-XR) 500 MG 24 hr tablet TAKE 2 TABLETS (1,000 MG TOTAL) BY MOUTH DAILY  WITH BREAKFAST. FOR DIABETES. 180 tablet 0   prednisoLONE acetate (PRED FORTE) 1 % ophthalmic suspension Place 1 drop into the left eye daily.      rosuvastatin (CRESTOR) 5 MG tablet TAKE 1 TABLET (5 MG TOTAL) BY MOUTH DAILY FOR CHOLESTEROL 90 tablet 2   Semaglutide (RYBELSUS) 7 MG TABS Take 1 tablet (7 mg total) by mouth daily. 30 tablet 0   Vitamin D, Ergocalciferol, (DRISDOL) 1.25 MG (50000 UNIT) CAPS capsule Take 1 capsule (50,000 Units total) by mouth every 7 (seven) days. 4 capsule 0   No current facility-administered medications for this visit.    Physical Exam BP 111/77 (BP Location: Left Arm, Patient Position: Sitting)   Pulse 71   Resp 18   Ht 6\' 1"  (1.854 m)   Wt 292 lb (132.5 kg)   SpO2 95% Comment: RA  BMI 38.31 kg/m  57 year old man in no acute distress Alert and oriented x 3 with no focal deficits Lungs clear bilaterally Cardiac regular rate and rhythm with normal S1 and S2 no murmur No carotid bruits No peripheral edema  Diagnostic Tests: CT ANGIOGRAPHY CHEST WITH CONTRAST   TECHNIQUE: Multidetector CT imaging of the chest was performed using the standard protocol during bolus administration of intravenous contrast. Multiplanar CT image reconstructions and MIPs were obtained to evaluate the vascular anatomy.   RADIATION DOSE REDUCTION: This exam was performed according to the departmental dose-optimization program  which includes automated exposure control, adjustment of the mA and/or kV according to patient size and/or use of iterative reconstruction technique.   CONTRAST:  75mL ISOVUE-370 IOPAMIDOL (ISOVUE-370) INJECTION 76%   COMPARISON:  January 23, 2022.   FINDINGS: Cardiovascular: Grossly stable 5.0 cm ascending thoracic aortic aneurysm is noted. No dissection is noted. Great vessels are widely patent. Normal cardiac size. No pericardial effusion.   Mediastinum/Nodes: No enlarged mediastinal, hilar, or axillary lymph nodes. Thyroid gland, trachea,  and esophagus demonstrate no significant findings.   Lungs/Pleura: Lungs are clear. No pleural effusion or pneumothorax.   Upper Abdomen: No acute abnormality.   Musculoskeletal: No chest wall abnormality. No acute or significant osseous findings.   Review of the MIP images confirms the above findings.   IMPRESSION: Grossly stable 5.0 cm Ascending thoracic aortic aneurysm. Recommend semi-annual imaging followup by CTA or MRA and referral to cardiothoracic surgery if not already obtained. This recommendation follows 2010 ACCF/AHA/AATS/ACR/ASA/SCA/SCAI/SIR/STS/SVM Guidelines for the Diagnosis and Management of Patients With Thoracic Aortic Disease. Circulation. 2010; 121: Z610-R604. Aortic aneurysm NOS (ICD10-I71.9).     Electronically Signed   By: Lupita Raider M.D.   On: 08/01/2022 11:03   I personally reviewed the CT images and measured the aneurysm myself.  Relatively normal aortic root and then a 5 cm ascending aneurysm.  Bovine arch.  Impression: Antonio Horton is a 57 year old man with a history of morbid obesity, borderline hypertension, hyperlipidemia, sleep apnea, type 2 diabetes without complication, vasovagal syncope, and an ascending aortic aneurysm.  Ascending aneurysm-stable at 5 cm.  Needs continued semiannual follow-up.  No indication for surgery.  He understands importance of blood pressure control.  His blood pressure is normal today.  He is not on medication for that.  Hyperlipidemia-he is on rosuvastatin.  Diabetes-type 2 diabetes.  Well-controlled.  Plan: Return in 6 months with CT angiogram of chest  Loreli Slot, MD [facility] [phone]

## 2022-08-06 ENCOUNTER — Ambulatory Visit (INDEPENDENT_AMBULATORY_CARE_PROVIDER_SITE_OTHER): Payer: 59 | Admitting: Family Medicine

## 2022-08-06 ENCOUNTER — Encounter (INDEPENDENT_AMBULATORY_CARE_PROVIDER_SITE_OTHER): Payer: Self-pay | Admitting: Family Medicine

## 2022-08-06 VITALS — BP 121/71 | HR 81 | Temp 98.0°F | Ht 73.0 in | Wt 288.0 lb

## 2022-08-06 DIAGNOSIS — E1165 Type 2 diabetes mellitus with hyperglycemia: Secondary | ICD-10-CM | POA: Diagnosis not present

## 2022-08-06 DIAGNOSIS — E559 Vitamin D deficiency, unspecified: Secondary | ICD-10-CM

## 2022-08-06 DIAGNOSIS — E669 Obesity, unspecified: Secondary | ICD-10-CM

## 2022-08-06 DIAGNOSIS — Z6838 Body mass index (BMI) 38.0-38.9, adult: Secondary | ICD-10-CM | POA: Diagnosis not present

## 2022-08-06 DIAGNOSIS — Z7985 Long-term (current) use of injectable non-insulin antidiabetic drugs: Secondary | ICD-10-CM

## 2022-08-06 MED ORDER — VITAMIN D (ERGOCALCIFEROL) 1.25 MG (50000 UNIT) PO CAPS: 50000.0000 [IU] | ORAL_CAPSULE | ORAL | 0 refills | Status: DC

## 2022-08-06 MED ORDER — RYBELSUS 7 MG PO TABS: 7.0000 mg | ORAL_TABLET | Freq: Every day | ORAL | 0 refills | Status: DC

## 2022-08-11 NOTE — Progress Notes (Unsigned)
Chief Complaint:   OBESITY Antonio Horton is here to discuss his progress with his obesity treatment plan along with follow-up of his obesity related diagnoses. Antonio Horton is on keeping a food journal and adhering to recommended goals of 1500-1800 calories and 120+ grams of protein and states he is following his eating plan approximately 70% of the time. Antonio Horton states he is doing 0 minutes 0 times per week.  Today's visit was #: 14 Starting weight: 313 lbs Starting date: 09/25/2021 Today's weight: 288 lbs Today's date: 08/06/2022 Total lbs lost to date: 25 Total lbs lost since last in-office visit: 0  Interim History: Patient has done well with maintaining his weight loss.  He is struggling to eat all of his food.  Subjective:   1. Vitamin D deficiency Patient is on vitamin D prescription, and he denies nausea or vomiting.  2. Type 2 diabetes mellitus with hyperglycemia, without long-term current use of insulin (HCC) Patient notes decreased polyphagia on Rybelsus, but sometimes struggles to not skip meals.  He denies nausea or vomiting.  Assessment/Plan:   1. Vitamin D deficiency Patient will continue prescription vitamin D once weekly, and we will refill for 1 month.  - Vitamin D, Ergocalciferol, (DRISDOL) 1.25 MG (50000 UNIT) CAPS capsule; Take 1 capsule (50,000 Units total) by mouth every 7 (seven) days.  Dispense: 4 capsule; Refill: 0  2. Type 2 diabetes mellitus with hyperglycemia, without long-term current use of insulin (HCC) Patient will continue Rybelsus 7 mg once daily, and we will refill for 1 month.  - Semaglutide (RYBELSUS) 7 MG TABS; Take 1 tablet (7 mg total) by mouth daily.  Dispense: 30 tablet; Refill: 0  3. BMI 38.0-38.9,adult  4. Obesity, Beginning BMI 41.56 Antonio Horton is currently in the action stage of change. As such, his goal is to continue with weight loss efforts. He has agreed to keeping a food journal and adhering to recommended goals of  1500-1800 calories and 120+ grams of protein daily.   Patient was advised to eat his protein first to help him meet his nutritional goals and prevent decrease in RMR.  Behavioral modification strategies: increasing lean protein intake.  [name] has agreed to follow-up with our clinic in 3 to 4 weeks. He was informed of the importance of frequent follow-up visits to maximize his success with intensive lifestyle modifications for his multiple health conditions.   Objective:   Blood pressure 121/71, pulse 81, temperature 98 F (36.7 C), height 6\' 1"  (1.854 m), weight 288 lb (130.6 kg), SpO2 95%. Body mass index is 38 kg/m.  Lab Results  Component Value Date   CREATININE 0.94 07/09/2022   BUN 13 07/09/2022   NA 136 07/09/2022   K 3.9 07/09/2022   CL 101 07/09/2022   CO2 25 07/09/2022   Lab Results  Component Value Date   ALT 18 07/09/2022   AST 16 07/09/2022   ALKPHOS 42 07/09/2022   BILITOT 1.1 07/09/2022   Lab Results  Component Value Date   HGBA1C 6.1 (H) 02/27/2022   HGBA1C 7.4 (H) 09/25/2021   HGBA1C 7.1 (A) 09/10/2021   HGBA1C 7.2 (H) 03/13/2021   HGBA1C 6.6 (H) 11/25/2020   Lab Results  Component Value Date   INSULIN 18.0 02/27/2022   INSULIN 41.9 (H) 09/25/2021   Lab Results  Component Value Date   TSH 1.350 09/25/2021   Lab Results  Component Value Date   CHOL 122 02/27/2022   HDL 42 02/27/2022   LDLCALC 66 02/27/2022  TRIG 69 02/27/2022   CHOLHDL 3 03/13/2021   Lab Results  Component Value Date   VD25OH 41.5 02/27/2022   VD25OH 19.9 (L) 09/25/2021   Lab Results  Component Value Date   WBC 9.9 07/09/2022   HGB 16.2 07/09/2022   HCT 47.4 07/09/2022   MCV 87.3 07/09/2022   PLT 269 07/09/2022   Lab Results  Component Value Date   IRON 94 04/12/2021   FERRITIN 278.7 04/12/2021   Attestation Statements:   Reviewed by clinician on day of visit: allergies, medications, problem list, medical history, surgical history, family history,  social history, and previous encounter notes.   I, Antonio Horton, am acting as transcriptionist for Antonio Quince, MD.  I have reviewed the above documentation for accuracy and completeness, and I agree with the above. -  Antonio Quince, MD

## 2022-08-26 ENCOUNTER — Other Ambulatory Visit: Payer: Self-pay | Admitting: Primary Care

## 2022-08-26 DIAGNOSIS — E119 Type 2 diabetes mellitus without complications: Secondary | ICD-10-CM

## 2022-09-04 ENCOUNTER — Encounter (INDEPENDENT_AMBULATORY_CARE_PROVIDER_SITE_OTHER): Payer: Self-pay

## 2022-09-09 ENCOUNTER — Ambulatory Visit (INDEPENDENT_AMBULATORY_CARE_PROVIDER_SITE_OTHER): Payer: 59 | Admitting: Family Medicine

## 2022-09-09 ENCOUNTER — Encounter (INDEPENDENT_AMBULATORY_CARE_PROVIDER_SITE_OTHER): Payer: Self-pay | Admitting: Family Medicine

## 2022-09-09 VITALS — BP 113/71 | HR 72 | Temp 97.6°F | Ht 73.0 in | Wt 299.0 lb

## 2022-09-09 DIAGNOSIS — E669 Obesity, unspecified: Secondary | ICD-10-CM | POA: Diagnosis not present

## 2022-09-09 DIAGNOSIS — R0602 Shortness of breath: Secondary | ICD-10-CM

## 2022-09-09 DIAGNOSIS — E1165 Type 2 diabetes mellitus with hyperglycemia: Secondary | ICD-10-CM

## 2022-09-09 DIAGNOSIS — Z7985 Long-term (current) use of injectable non-insulin antidiabetic drugs: Secondary | ICD-10-CM

## 2022-09-09 DIAGNOSIS — E559 Vitamin D deficiency, unspecified: Secondary | ICD-10-CM | POA: Diagnosis not present

## 2022-09-09 DIAGNOSIS — Z6839 Body mass index (BMI) 39.0-39.9, adult: Secondary | ICD-10-CM

## 2022-09-09 MED ORDER — RYBELSUS 14 MG PO TABS
14.0000 mg | ORAL_TABLET | Freq: Every day | ORAL | 0 refills | Status: DC
Start: 2022-09-09 — End: 2022-10-08

## 2022-09-09 MED ORDER — VITAMIN D (ERGOCALCIFEROL) 1.25 MG (50000 UNIT) PO CAPS: 50000.0000 [IU] | ORAL_CAPSULE | ORAL | 0 refills | Status: DC

## 2022-09-10 NOTE — Progress Notes (Signed)
Chief Complaint:   OBESITY Antonio Horton is here to discuss his progress with his obesity treatment plan along with follow-up of his obesity related diagnoses. Antonio Horton is on keeping a food journal and adhering to recommended goals of 1500-1800 calories and 120+ grams of protein and states he is following his eating plan approximately 85% of the time. Antonio Horton states he is doing crunches and leg lifts for 15 minutes 2 times per week.  Today's visit was #: 15 Starting weight: 313 lbs Starting date: 09/25/2021 Today's weight: 299 lbs Today's date: 09/09/2022 Total lbs lost to date: 14 Total lbs lost since last in-office visit: 0  Interim History: Patient is struggling with following his eating plan and he feels he needs a reset.  He notes decreased family support and he struggles with temptations.  Subjective:   1. Vitamin D deficiency Patient is stable on vitamin D with no side effects noted.  He requests a refill.  2. Type 2 diabetes mellitus with hyperglycemia, without long-term current use of insulin (HCC) Patient is on Rybelsus 7 mg with no side effects.  He still notes significant polyphagia although this may be due to decreased protein and increased carbohydrates.  3. SOBOE (shortness of breath on exertion) Patient is due for a repeat IC.  He notes no change in symptoms.  Assessment/Plan:   1. Vitamin D deficiency We will refill prescription vitamin D once weekly for 1 month.  - Vitamin D, Ergocalciferol, (DRISDOL) 1.25 MG (50000 UNIT) CAPS capsule; Take 1 capsule (50,000 Units total) by mouth every 7 (seven) days.  Dispense: 4 capsule; Refill: 0  2. Type 2 diabetes mellitus with hyperglycemia, without long-term current use of insulin (HCC) Patient agreed to increase Rybelsus to 14 mg once daily, and we will refill for 1 month.  We will follow-up at his next visit in 4 weeks.  - Semaglutide (RYBELSUS) 14 MG TABS; Take 1 tablet (14 mg total) by mouth daily.  Dispense:  30 tablet; Refill: 0  3. SOBOE (shortness of breath on exertion) Patient's RMR is slightly lower than his previous results and lower than expected for his age, height, and weight.  He was encouraged to increase his protein and strengthening exercises to increase his RMR.  4. BMI 39.0-39.9,adult  5. Obesity, Beginning BMI 41.56 Antonio Horton is currently in the action stage of change. As such, his goal is to continue with weight loss efforts. He has agreed to change to following a lower carbohydrate, vegetable and lean protein rich diet plan.   Family support handout was given.   Exercise goals: As is.   Behavioral modification strategies: decreasing simple carbohydrates, increasing water intake, and dealing with family or coworker sabotage.  Antonio Horton has agreed to follow-up with our clinic in 4 weeks. He was informed of the importance of frequent follow-up visits to maximize his success with intensive lifestyle modifications for his multiple health conditions.   Objective:   Blood pressure 113/71, pulse 72, temperature 97.6 F (36.4 C), height 6\' 1"  (1.854 m), weight 299 lb (135.6 kg), SpO2 97%. Body mass index is 39.45 kg/m.  Lab Results  Component Value Date   CREATININE 0.94 07/09/2022   BUN 13 07/09/2022   NA 136 07/09/2022   K 3.9 07/09/2022   CL 101 07/09/2022   CO2 25 07/09/2022   Lab Results  Component Value Date   ALT 18 07/09/2022   AST 16 07/09/2022   ALKPHOS 42 07/09/2022   BILITOT 1.1 07/09/2022   Lab Results  Component Value Date   HGBA1C 6.1 (H) 02/27/2022   HGBA1C 7.4 (H) 09/25/2021   HGBA1C 7.1 (A) 09/10/2021   HGBA1C 7.2 (H) 03/13/2021   HGBA1C 6.6 (H) 11/25/2020   Lab Results  Component Value Date   INSULIN 18.0 02/27/2022   INSULIN 41.9 (H) 09/25/2021   Lab Results  Component Value Date   TSH 1.350 09/25/2021   Lab Results  Component Value Date   CHOL 122 02/27/2022   HDL 42 02/27/2022   LDLCALC 66 02/27/2022   TRIG 69 02/27/2022    CHOLHDL 3 03/13/2021   Lab Results  Component Value Date   VD25OH 41.5 02/27/2022   VD25OH 19.9 (L) 09/25/2021   Lab Results  Component Value Date   WBC 9.9 07/09/2022   HGB 16.2 07/09/2022   HCT 47.4 07/09/2022   MCV 87.3 07/09/2022   PLT 269 07/09/2022   Lab Results  Component Value Date   IRON 94 04/12/2021   FERRITIN 278.7 04/12/2021   Attestation Statements:   Reviewed by clinician on day of visit: allergies, medications, problem list, medical history, surgical history, family history, social history, and previous encounter notes.   I, Antonio Horton, am acting as transcriptionist for Antonio Quince, MD.  I have reviewed the above documentation for accuracy and completeness, and I agree with the above. -  Antonio Quince, MD

## 2022-09-16 ENCOUNTER — Ambulatory Visit: Payer: 59 | Admitting: Primary Care

## 2022-09-23 ENCOUNTER — Ambulatory Visit (INDEPENDENT_AMBULATORY_CARE_PROVIDER_SITE_OTHER): Payer: 59 | Admitting: Primary Care

## 2022-09-23 ENCOUNTER — Encounter: Payer: Self-pay | Admitting: Primary Care

## 2022-09-23 VITALS — BP 104/62 | HR 70 | Temp 98.1°F | Ht 73.0 in | Wt 308.0 lb

## 2022-09-23 DIAGNOSIS — E1165 Type 2 diabetes mellitus with hyperglycemia: Secondary | ICD-10-CM

## 2022-09-23 DIAGNOSIS — Z7984 Long term (current) use of oral hypoglycemic drugs: Secondary | ICD-10-CM

## 2022-09-23 LAB — POCT GLYCOSYLATED HEMOGLOBIN (HGB A1C): Hemoglobin A1C: 6.5 % — AB (ref 4.0–5.6)

## 2022-09-23 NOTE — Patient Instructions (Signed)
Start exercising. You should be getting 150 minutes of moderate intensity exercise weekly.  Continue to work on [employer].  Please schedule a physical to meet with me in 6 months.   It was a pleasure to see you today!

## 2022-09-23 NOTE — Assessment & Plan Note (Signed)
Slightly deteriorated, overall controlled with A1C of 6.5 today.  Discussed to work on diet and exercise. Foot exam today. Eye exam UTD, urine microalbumin UTD.  Continue metformin XR 1000 mg daily, semaglutide 14 mg daily.  Follow up in 6 months.

## 2022-09-23 NOTE — Progress Notes (Signed)
Subjective:    Patient ID: Antonio Horton, male    DOB: 08-03-1965, 57 y.o.   MRN: [id number]  HPI  Antonio Horton is a very pleasant 57 y.o. male with a history of type 2 diabetes, hypertension, CAD, hyperlipidemia who presents today for follow up of diabetes.   Current medications include: metformin XR 1000 mg daily, semaglutide 14 mg daily.   He is checking his blood glucose 0 times daily.  Last A1C: 6.20 February 2022, 6.5 today Last Eye Exam: Due this Fall.  Last Foot Exam: Due Pneumonia Vaccination: 2021 Urine Microalbumin: UTD Statin: rosuvastatin   Dietary changes since last visit: No carbs, increased protein. Drinking water.    Exercise: None. Active at home.    BP Readings from Last 3 Encounters:  09/23/22 104/62  09/09/22 113/71  08/06/22 121/71   Wt Readings from Last 3 Encounters:  09/23/22 (!) 308 lb (139.7 kg)  09/09/22 299 lb (135.6 kg)  08/06/22 288 lb (130.6 kg)      Review of Systems  Eyes:  Negative for visual disturbance.  Respiratory:  Negative for shortness of breath.   Cardiovascular:  Negative for chest pain.  Neurological:  Negative for numbness.         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   Borderline diabetes    Cholelithiases 11/25/2020   COVID-19 virus infection 06/14/2020   Diabetes mellitus without complication (HCC)    Hepatic steatosis 12/05/2020   Hyperlipidemia    Hypertension    Hypoxemia 11/25/2020   S/P laparoscopic cholecystectomy 11/27/2020   Sepsis (HCC) 11/25/2020   Sleep apnea    wears CPAP   SOBOE (shortness of breath on exertion) 09/25/2021   Syncope 11/25/2020   Vasovagal reaction     Social History   Socioeconomic History   Marital status: Married    Spouse name: Not on file   Number of children: Not on file   Years of education: Not on file   Highest education level: Not on file  Occupational History   Not on file  Tobacco Use    Smoking status: Never   Smokeless tobacco: Never  Vaping Use   Vaping status: Never Used  Substance and Sexual Activity   Alcohol use: Not Currently    Alcohol/week: 0.0 standard drinks of alcohol    Comment: socially   Drug use: No   Sexual activity: Not on file  Other Topics Concern   Not on file  Social History Narrative   Married.   2 children.   Works as a Games developer.   Enjoys reading.    Social Determinants of Health   Financial Resource Strain: Not on file  Food Insecurity: Not on file  Transportation Needs: Not on file  Physical Activity: Not on file  Stress: Not on file  Social Connections: Not on file  Intimate Partner Violence: Not on file    Past Surgical History:  Procedure Laterality Date   CHOLECYSTECTOMY N/A 11/27/2020   Procedure: LAPAROSCOPIC CHOLECYSTECTOMY;  Surgeon: Violeta Gelinas, MD;  Location: [facility] OR;  Service: General;  Laterality: N/A;   EYE SURGERY     Corneal dystrophies    Family History  Adopted: Yes  Problem Relation Age of Onset   Other Other        adopted   Thyroid disease Neg Hx     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  Current Outpatient Medications on File Prior to Visit  Medication Sig Dispense Refill   lisinopril-hydrochlorothiazide (ZESTORETIC) 10-12.5 MG tablet TAKE 1 TABLET BY MOUTH EVERY DAY FOR BLOOD PRESSURE 90 tablet 3   metFORMIN (GLUCOPHAGE-XR) 500 MG 24 hr tablet TAKE 2 TABLETS (1,000 MG TOTAL) BY MOUTH DAILY WITH BREAKFAST. FOR DIABETES. 180 tablet 0   prednisoLONE acetate (PRED FORTE) 1 % ophthalmic suspension Place 1 drop into the left eye daily.      rosuvastatin (CRESTOR) 5 MG tablet TAKE 1 TABLET (5 MG TOTAL) BY MOUTH DAILY FOR CHOLESTEROL 90 tablet 2   Semaglutide (RYBELSUS) 14 MG TABS Take 1 tablet (14 mg total) by mouth daily. 30 tablet 0   Vitamin D, Ergocalciferol, (DRISDOL) 1.25 MG (50000 UNIT) CAPS capsule Take 1 capsule (50,000 Units total) by mouth every  7 (seven) days. 4 capsule 0   Continuous Blood Gluc Receiver (FREESTYLE LIBRE 14 DAY READER) DEVI FreeStyle Libre 14 Day Reader 1 DEVICE BY DOES NOT APPLY ROUTE 3 (THREE) TIMES DAILY AS NEEDED. (Patient not taking: Reported on 09/23/2022)     Continuous Glucose Sensor (FREESTYLE LIBRE 3 SENSOR) MISC PLACE 1 SENSOR ON THE SKIN EVERY 14 DAYS. USE TO CHECK GLUCOSE CONTINUOUSLY (Patient not taking: Reported on 09/23/2022) 4 each 2   No current facility-administered medications on file prior to visit.    BP 104/62   Pulse 70   Temp 98.1 F (36.7 C) (Temporal)   Ht 6\' 1"  (1.854 m)   Wt (!) 308 lb (139.7 kg)   SpO2 98%   BMI 40.64 kg/m  Objective:   Physical Exam Cardiovascular:     Rate and Rhythm: Normal rate and regular rhythm.  Pulmonary:     Effort: Pulmonary effort is normal.     Breath sounds: Normal breath sounds. No wheezing or rales.  Musculoskeletal:     Cervical back: Neck supple.  Skin:    General: Skin is warm and dry.  Neurological:     Mental Status: He is alert and oriented to person, place, and time.           Assessment & Plan:  Type 2 diabetes mellitus with hyperglycemia, without long-term current use of insulin (HCC) Assessment & Plan: Slightly deteriorated, overall controlled with A1C of 6.5 today.  Discussed to work on diet and exercise. Foot exam today. Eye exam UTD, urine microalbumin UTD.  Continue metformin XR 1000 mg daily, semaglutide 14 mg daily.  Follow up in 6 months.   Orders: -     POCT glycosylated hemoglobin (Hb A1C)        Doreene Nest, NP

## 2022-10-02 ENCOUNTER — Other Ambulatory Visit (INDEPENDENT_AMBULATORY_CARE_PROVIDER_SITE_OTHER): Payer: Self-pay | Admitting: Family Medicine

## 2022-10-02 DIAGNOSIS — E559 Vitamin D deficiency, unspecified: Secondary | ICD-10-CM

## 2022-10-07 ENCOUNTER — Ambulatory Visit (INDEPENDENT_AMBULATORY_CARE_PROVIDER_SITE_OTHER): Payer: 59 | Admitting: Family Medicine

## 2022-10-08 ENCOUNTER — Ambulatory Visit (INDEPENDENT_AMBULATORY_CARE_PROVIDER_SITE_OTHER): Payer: 59 | Admitting: Family Medicine

## 2022-10-08 ENCOUNTER — Encounter (INDEPENDENT_AMBULATORY_CARE_PROVIDER_SITE_OTHER): Payer: Self-pay | Admitting: Family Medicine

## 2022-10-08 VITALS — BP 108/73 | HR 74 | Temp 97.4°F | Ht 73.0 in | Wt 287.0 lb

## 2022-10-08 DIAGNOSIS — Z6838 Body mass index (BMI) 38.0-38.9, adult: Secondary | ICD-10-CM

## 2022-10-08 DIAGNOSIS — E1165 Type 2 diabetes mellitus with hyperglycemia: Secondary | ICD-10-CM

## 2022-10-08 DIAGNOSIS — E669 Obesity, unspecified: Secondary | ICD-10-CM | POA: Diagnosis not present

## 2022-10-08 DIAGNOSIS — E559 Vitamin D deficiency, unspecified: Secondary | ICD-10-CM | POA: Diagnosis not present

## 2022-10-08 DIAGNOSIS — Z7984 Long term (current) use of oral hypoglycemic drugs: Secondary | ICD-10-CM

## 2022-10-08 MED ORDER — VITAMIN D (ERGOCALCIFEROL) 1.25 MG (50000 UNIT) PO CAPS: 50000.0000 [IU] | ORAL_CAPSULE | ORAL | 0 refills | Status: DC

## 2022-10-08 MED ORDER — RYBELSUS 14 MG PO TABS
14.0000 mg | ORAL_TABLET | Freq: Every day | ORAL | 0 refills | Status: DC
Start: 2022-10-08 — End: 2022-11-04

## 2022-10-09 NOTE — Progress Notes (Signed)
Chief Complaint:   OBESITY Antonio Horton is here to discuss his progress with his obesity treatment plan along with follow-up of his obesity related diagnoses. Antonio Horton is on following a lower carbohydrate, vegetable and lean protein rich diet plan and states he is following his eating plan approximately 98% of the time. Antonio Horton states he is doing 0 minutes 0 times per week.  Today's visit was #: 16 Starting weight: 313 lbs Starting date: 09/25/2021 Today's weight: 287 lbs Today's date: 10/08/2022 Total lbs lost to date: 26 Total lbs lost since last in-office visit: 12  Interim History: Patient has done especially well with his weight loss on the low carbohydrate plan, but his wife is struggling with this.  He is limited in his exercise.  Subjective:   1. Vitamin D deficiency Patient is stable on vitamin D with no side effects noted.  He requests a refill today.  2. Type 2 diabetes mellitus with hyperglycemia, without long-term current use of insulin (HCC) Patient is doing well on Rybelsus with no GI upset, and he notes decreased polyphagia.  He is doing especially well on his low carbohydrate eating plan.  Assessment/Plan:   1. Vitamin D deficiency Patient will continue prescription vitamin D once weekly, and we will refill for 1 month.  - Vitamin D, Ergocalciferol, (DRISDOL) 1.25 MG (50000 UNIT) CAPS capsule; Take 1 capsule (50,000 Units total) by mouth every 7 (seven) days.  Dispense: 4 capsule; Refill: 0  2. Type 2 diabetes mellitus with hyperglycemia, without long-term current use of insulin (HCC) Patient will continue Rybelsus 14 mg once daily, and we will refill for 1 month.  - Semaglutide (RYBELSUS) 14 MG TABS; Take 1 tablet (14 mg total) by mouth daily.  Dispense: 30 tablet; Refill: 0  3. BMI 38.0-38.9,adult  4. Obesity, Beginning BMI 41.56 Antonio Horton is currently in the action stage of change. As such, his goal is to continue with weight loss efforts. He has  agreed to following a lower carbohydrate, vegetable and lean protein rich diet plan.   Behavioral modification strategies: decreasing simple carbohydrates.  Antonio Horton has agreed to follow-up with our clinic in 4 weeks. He was informed of the importance of frequent follow-up visits to maximize his success with intensive lifestyle modifications for his multiple health conditions.   Objective:   Blood pressure 108/73, pulse 74, temperature (!) 97.4 F (36.3 C), height 6\' 1"  (1.854 m), weight 287 lb (130.2 kg), SpO2 95%. Body mass index is 37.87 kg/m.  Lab Results  Component Value Date   CREATININE 0.94 07/09/2022   BUN 13 07/09/2022   NA 136 07/09/2022   K 3.9 07/09/2022   CL 101 07/09/2022   CO2 25 07/09/2022   Lab Results  Component Value Date   ALT 18 07/09/2022   AST 16 07/09/2022   ALKPHOS 42 07/09/2022   BILITOT 1.1 07/09/2022   Lab Results  Component Value Date   HGBA1C 6.5 (A) 09/23/2022   HGBA1C 6.1 (H) 02/27/2022   HGBA1C 7.4 (H) 09/25/2021   HGBA1C 7.1 (A) 09/10/2021   HGBA1C 7.2 (H) 03/13/2021   Lab Results  Component Value Date   INSULIN 18.0 02/27/2022   INSULIN 41.9 (H) 09/25/2021   Lab Results  Component Value Date   TSH 1.350 09/25/2021   Lab Results  Component Value Date   CHOL 122 02/27/2022   HDL 42 02/27/2022   LDLCALC 66 02/27/2022   TRIG 69 02/27/2022   CHOLHDL 3 03/13/2021   Lab Results  Component  Value Date   VD25OH 41.5 02/27/2022   VD25OH 19.9 (L) 09/25/2021   Lab Results  Component Value Date   WBC 9.9 07/09/2022   HGB 16.2 07/09/2022   HCT 47.4 07/09/2022   MCV 87.3 07/09/2022   PLT 269 07/09/2022   Lab Results  Component Value Date   IRON 94 04/12/2021   FERRITIN 278.7 04/12/2021   Attestation Statements:   Reviewed by clinician on day of visit: allergies, medications, problem list, medical history, surgical history, family history, social history, and previous encounter notes.   I, Burt Knack, am acting as  transcriptionist for Quillian Quince, MD.  I have reviewed the above documentation for accuracy and completeness, and I agree with the above. -  Quillian Quince, MD

## 2022-11-04 ENCOUNTER — Ambulatory Visit (INDEPENDENT_AMBULATORY_CARE_PROVIDER_SITE_OTHER): Payer: 59 | Admitting: Family Medicine

## 2022-11-04 ENCOUNTER — Encounter (INDEPENDENT_AMBULATORY_CARE_PROVIDER_SITE_OTHER): Payer: Self-pay | Admitting: Family Medicine

## 2022-11-04 VITALS — BP 103/72 | HR 77 | Temp 97.9°F | Ht 73.0 in | Wt 284.0 lb

## 2022-11-04 DIAGNOSIS — Z7985 Long-term (current) use of injectable non-insulin antidiabetic drugs: Secondary | ICD-10-CM

## 2022-11-04 DIAGNOSIS — E1165 Type 2 diabetes mellitus with hyperglycemia: Secondary | ICD-10-CM | POA: Diagnosis not present

## 2022-11-04 DIAGNOSIS — E669 Obesity, unspecified: Secondary | ICD-10-CM

## 2022-11-04 DIAGNOSIS — Z6837 Body mass index (BMI) 37.0-37.9, adult: Secondary | ICD-10-CM | POA: Diagnosis not present

## 2022-11-04 DIAGNOSIS — E559 Vitamin D deficiency, unspecified: Secondary | ICD-10-CM

## 2022-11-04 MED ORDER — VITAMIN D (ERGOCALCIFEROL) 1.25 MG (50000 UNIT) PO CAPS: 50000.0000 [IU] | ORAL_CAPSULE | ORAL | 0 refills | Status: DC

## 2022-11-04 MED ORDER — RYBELSUS 14 MG PO TABS: 14.0000 mg | ORAL_TABLET | Freq: Every day | ORAL | 0 refills | Status: DC

## 2022-11-04 NOTE — Progress Notes (Signed)
Chief Complaint:   OBESITY Antonio Horton is here to discuss his progress with his obesity treatment plan along with follow-up of his obesity related diagnoses. Antonio Horton is on following a lower carbohydrate, vegetable and lean protein rich diet plan and states he is following his eating plan approximately 50% of the time. Antonio Horton states he is doing 0 minutes 0 times per week.  Today's visit was #: 17 Starting weight: 313 lbs Starting date: 09/25/2021 Today's weight: 284 lbs Today's date: 11/04/2022 Total lbs lost to date: 29 Total lbs lost since last in-office visit: 3  Interim History: Patient continues to do well with his weight loss by decreasing simple carbohydrates when he is feeling well, but he was fighting a GI bug.  He is feeling better now and he is back to his eating plan.  Subjective:   1. Type 2 diabetes mellitus with hyperglycemia, without long-term current use of insulin (HCC) Patient has been working on his diet, and on Rybelsus.  He denies hypoglycemia.  His wife is newly diagnosed with diabetes mellitus and he is hoping she will start eating healthier with him, which would benefit both of them.  2. Vitamin D deficiency Patient's last vitamin D level was not yet at goal.  Assessment/Plan:   1. Type 2 diabetes mellitus with hyperglycemia, without long-term current use of insulin (HCC) Patient will continue Rybelsus, and we will refill for 1 month.  - Semaglutide (RYBELSUS) 14 MG TABS; Take 1 tablet (14 mg total) by mouth daily.  Dispense: 30 tablet; Refill: 0  2. Vitamin D deficiency Patient will continue prescription vitamin D, and we will refill for 1 month.  - Vitamin D, Ergocalciferol, (DRISDOL) 1.25 MG (50000 UNIT) CAPS capsule; Take 1 capsule (50,000 Units total) by mouth every 7 (seven) days.  Dispense: 4 capsule; Refill: 0  3. BMI 37.0-37.9, adult  4. Obesity, Beginning BMI 41.56 Antonio Horton is currently in the action stage of change. As such,  his goal is to continue with weight loss efforts. He has agreed to following a lower carbohydrate, vegetable and lean protein rich diet plan.   Exercise goals: All adults should avoid inactivity. Some physical activity is better than none, and adults who participate in any amount of physical activity gain some health benefits. Patient will start increasing his activity.   Behavioral modification strategies: increasing lean protein intake and holiday eating strategies .  Antonio Horton has agreed to follow-up with our clinic in 4 weeks. He was informed of the importance of frequent follow-up visits to maximize his success with intensive lifestyle modifications for his multiple health conditions.   Objective:   Blood pressure 103/72, pulse 77, temperature 97.9 F (36.6 C), height 6\' 1"  (1.854 m), weight 284 lb (128.8 kg), SpO2 97%. Body mass index is 37.47 kg/m.  Lab Results  Component Value Date   CREATININE 0.94 07/09/2022   BUN 13 07/09/2022   NA 136 07/09/2022   K 3.9 07/09/2022   CL 101 07/09/2022   CO2 25 07/09/2022   Lab Results  Component Value Date   ALT 18 07/09/2022   AST 16 07/09/2022   ALKPHOS 42 07/09/2022   BILITOT 1.1 07/09/2022   Lab Results  Component Value Date   HGBA1C 6.5 (A) 09/23/2022   HGBA1C 6.1 (H) 02/27/2022   HGBA1C 7.4 (H) 09/25/2021   HGBA1C 7.1 (A) 09/10/2021   HGBA1C 7.2 (H) 03/13/2021   Lab Results  Component Value Date   INSULIN 18.0 02/27/2022   INSULIN 41.9 (H)  09/25/2021   Lab Results  Component Value Date   TSH 1.350 09/25/2021   Lab Results  Component Value Date   CHOL 122 02/27/2022   HDL 42 02/27/2022   LDLCALC 66 02/27/2022   TRIG 69 02/27/2022   CHOLHDL 3 03/13/2021   Lab Results  Component Value Date   VD25OH 41.5 02/27/2022   VD25OH 19.9 (L) 09/25/2021   Lab Results  Component Value Date   WBC 9.9 07/09/2022   HGB 16.2 07/09/2022   HCT 47.4 07/09/2022   MCV 87.3 07/09/2022   PLT 269 07/09/2022   Lab Results   Component Value Date   IRON 94 04/12/2021   FERRITIN 278.7 04/12/2021   Attestation Statements:   Reviewed by clinician on day of visit: allergies, medications, problem list, medical history, surgical history, family history, social history, and previous encounter notes.   I, Burt Knack, am acting as transcriptionist for Quillian Quince, MD.  I have reviewed the above documentation for accuracy and completeness, and I agree with the above. -  Quillian Quince, MD

## 2022-11-18 ENCOUNTER — Other Ambulatory Visit: Payer: Self-pay | Admitting: Primary Care

## 2022-11-18 DIAGNOSIS — E119 Type 2 diabetes mellitus without complications: Secondary | ICD-10-CM

## 2022-11-27 ENCOUNTER — Other Ambulatory Visit (INDEPENDENT_AMBULATORY_CARE_PROVIDER_SITE_OTHER): Payer: Self-pay | Admitting: Family Medicine

## 2022-11-27 DIAGNOSIS — E559 Vitamin D deficiency, unspecified: Secondary | ICD-10-CM

## 2022-12-02 ENCOUNTER — Ambulatory Visit (INDEPENDENT_AMBULATORY_CARE_PROVIDER_SITE_OTHER): Payer: 59 | Admitting: Family Medicine

## 2022-12-02 ENCOUNTER — Encounter (INDEPENDENT_AMBULATORY_CARE_PROVIDER_SITE_OTHER): Payer: Self-pay | Admitting: Family Medicine

## 2022-12-02 VITALS — BP 128/69 | HR 69 | Temp 97.9°F | Ht 73.0 in | Wt 283.0 lb

## 2022-12-02 DIAGNOSIS — Z0000 Encounter for general adult medical examination without abnormal findings: Secondary | ICD-10-CM

## 2022-12-02 DIAGNOSIS — E1165 Type 2 diabetes mellitus with hyperglycemia: Secondary | ICD-10-CM

## 2022-12-02 DIAGNOSIS — Z6837 Body mass index (BMI) 37.0-37.9, adult: Secondary | ICD-10-CM | POA: Diagnosis not present

## 2022-12-02 DIAGNOSIS — E559 Vitamin D deficiency, unspecified: Secondary | ICD-10-CM | POA: Diagnosis not present

## 2022-12-02 DIAGNOSIS — E669 Obesity, unspecified: Secondary | ICD-10-CM

## 2022-12-02 DIAGNOSIS — Z7984 Long term (current) use of oral hypoglycemic drugs: Secondary | ICD-10-CM

## 2022-12-02 MED ORDER — RYBELSUS 14 MG PO TABS: 14.0000 mg | ORAL_TABLET | Freq: Every day | ORAL | 0 refills | Status: DC

## 2022-12-02 MED ORDER — VITAMIN D (ERGOCALCIFEROL) 1.25 MG (50000 UNIT) PO CAPS: 50000.0000 [IU] | ORAL_CAPSULE | ORAL | 0 refills | Status: DC

## 2022-12-02 NOTE — Progress Notes (Signed)
.smr  Office: [phone]  /  Fax: [phone]  WEIGHT SUMMARY AND BIOMETRICS  Anthropometric Measurements Height: 6\' 1"  (1.854 m) Weight: 283 lb (128.4 kg) BMI (Calculated): 37.35 Weight at Last Visit: 284 lb Weight Lost Since Last Visit: 1 lb Weight Gained Since Last Visit: 0 Starting Weight: 313 lb Total Weight Loss (lbs): 30 lb (13.6 kg)   Body Composition  Body Fat %: 37.8 % Fat Mass (lbs): 107.2 lbs Muscle Mass (lbs): 167.6 lbs Total Body Water (lbs): 130.6 lbs Visceral Fat Rating : 22   Other Clinical Data Fasting: No Labs: No Today's Visit #: 18 Starting Date: 09/25/21    Chief Complaint: OBESITY   History of Present Illness   The patient, diagnosed with type 2 diabetes, vitamin D deficiency, and obesity, presents for a follow-up visit. He reports a weight loss of one pound over the past month, attributing this to adherence to a low-carb diet approximately 70% of the time. However, he admits to not currently engaging in any exercise. He is on Rybelsus for diabetes and prescription vitamin D for his deficiency. His most recent HbA1c was 6.5, indicating moderate control of his diabetes, and his vitamin D levels have improved but are not yet at goal.  The patient has been struggling with his diet due to personal stressors, specifically his wife's severe depression following a surgery earlier in the year. He reports that his wife has been struggling with her mental health, leading to a shift in the patient's dietary focus. The patient admits to consuming comfort food more frequently over the past month, which he believes has hindered his weight loss progress.  The patient also reports feeling unwell in the mornings, which has affected his breakfast intake. He expresses a distaste for eggs, which has been a staple in his low-carb diet, and has been resorting to a piece of toast for breakfast. He acknowledges that this is likely insufficient nutrition, especially  considering his Rybelsus medication. He expresses a willingness to try a breakfast smoothie recipe suggested to him, which includes a protein supplement, in an effort to increase his protein intake.  The patient's personal stressors and dietary struggles are impacting his health management, but he remains committed to improving his health. He is open to modifying his diet and is actively seeking ways to better manage his conditions.          PHYSICAL EXAM:  Blood pressure 128/69, pulse 69, temperature 97.9 F (36.6 C), height 6\' 1"  (1.854 m), weight 283 lb (128.4 kg), SpO2 97%. Body mass index is 37.34 kg/m.  DIAGNOSTIC DATA REVIEWED:  BMET    Component Value Date/Time   NA 136 07/09/2022 1318   NA 142 02/27/2022 0813   K 3.9 07/09/2022 1318   CL 101 07/09/2022 1318   CO2 25 07/09/2022 1318   GLUCOSE 138 (H) 07/09/2022 1318   BUN 13 07/09/2022 1318   BUN 21 02/27/2022 0813   CREATININE 0.94 07/09/2022 1318   CALCIUM 9.6 07/09/2022 1318   GFRNONAA >60 07/09/2022 1318   GFRAA 95 12/08/2007 0958   Lab Results  Component Value Date   HGBA1C 6.5 (A) 09/23/2022   HGBA1C 6.2 (H) 12/15/2007   Lab Results  Component Value Date   INSULIN 18.0 02/27/2022   INSULIN 41.9 (H) 09/25/2021   Lab Results  Component Value Date   TSH 1.350 09/25/2021   CBC    Component Value Date/Time   WBC 9.9 07/09/2022 1318   RBC 5.43 07/09/2022 1318  HGB 16.2 07/09/2022 1318   HGB 15.9 09/25/2021 1010   HCT 47.4 07/09/2022 1318   HCT 46.5 09/25/2021 1010   PLT 269 07/09/2022 1318   MCV 87.3 07/09/2022 1318   MCV 91 09/25/2021 1010   MCH 29.8 07/09/2022 1318   MCHC 34.2 07/09/2022 1318   RDW 13.2 07/09/2022 1318   RDW 13.1 09/25/2021 1010   Iron Studies    Component Value Date/Time   IRON 94 04/12/2021 1115   FERRITIN 278.7 04/12/2021 1115   Lipid Panel     Component Value Date/Time   CHOL 122 02/27/2022 0813   TRIG 69 02/27/2022 0813   HDL 42 02/27/2022 0813   CHOLHDL 3  03/13/2021 0906   VLDL 25.8 03/13/2021 0906   LDLCALC 66 02/27/2022 0813   LDLCALC 117 (H) 03/25/2019 1541   Hepatic Function Panel     Component Value Date/Time   PROT 7.6 07/09/2022 1318   PROT 7.0 02/27/2022 0813   ALBUMIN 4.4 07/09/2022 1318   ALBUMIN 4.3 02/27/2022 0813   AST 16 07/09/2022 1318   ALT 18 07/09/2022 1318   ALKPHOS 42 07/09/2022 1318   BILITOT 1.1 07/09/2022 1318   BILITOT 0.5 02/27/2022 0813   BILIDIR 0.2 03/25/2021 0903      Component Value Date/Time   TSH 1.350 09/25/2021 1010   Nutritional Lab Results  Component Value Date   VD25OH 41.5 02/27/2022   VD25OH 19.9 (L) 09/25/2021     Assessment and Plan    Type 2 Diabetes Mellitus   Type 2 diabetes mellitus, managed with Rybelsus. Hemoglobin A1c is 6.5, indicating good control. Following a low-carb diet 70% of the time but not exercising. Discussed the importance of morning nutrition and adequate protein intake.   - Refill Rybelsus   - Encourage increased physical activity, such as walking   - Discuss breakfast options to improve morning nutrition, including a breakfast smoothie recipe with [employer] fat-free milk, light Greek yogurt, ice, and frozen berries    Obesity   Obesity with a recent weight loss of one pound. Consuming comfort foods due to personal stress, not focusing on diet. Discussed the impact of stress on dietary habits and the importance of balanced nutrition.   - Encourage adherence to a balanced diet   - Discuss strategies for managing diet during stressful times   - Encourage increased physical activity    Vitamin D Deficiency   Vitamin D deficiency, managed with prescription vitamin D. Recent levels improved but not yet at goal.   - Refill prescription vitamin D    General Health Maintenance   Discussed the importance of mental health support for his spouse, affecting his own health. Provided information on mental health services and potential benefits of therapy and counseling.    - Provide a packet of mental health service options for his spouse   - Encourage support for his spouse in seeking therapy or counseling   - Discuss potential benefits of telehealth and in-person therapy options    Follow-up   - Schedule fasting labs for January 06, 2023   - Schedule January appointment.       He was informed of the importance of frequent follow up visits to maximize his success with intensive lifestyle modifications for his multiple health conditions.    Quillian Quince, MD

## 2022-12-30 ENCOUNTER — Other Ambulatory Visit: Payer: Self-pay | Admitting: Thoracic Surgery (Cardiothoracic Vascular Surgery)

## 2022-12-30 DIAGNOSIS — I7121 Aneurysm of the ascending aorta, without rupture: Secondary | ICD-10-CM

## 2023-01-06 ENCOUNTER — Encounter (INDEPENDENT_AMBULATORY_CARE_PROVIDER_SITE_OTHER): Payer: Self-pay | Admitting: Family Medicine

## 2023-01-06 ENCOUNTER — Ambulatory Visit (INDEPENDENT_AMBULATORY_CARE_PROVIDER_SITE_OTHER): Payer: 59 | Admitting: Family Medicine

## 2023-01-06 VITALS — BP 107/63 | HR 79 | Temp 97.6°F | Ht 73.0 in | Wt 279.0 lb

## 2023-01-06 DIAGNOSIS — Z7984 Long term (current) use of oral hypoglycemic drugs: Secondary | ICD-10-CM

## 2023-01-06 DIAGNOSIS — I10 Essential (primary) hypertension: Secondary | ICD-10-CM

## 2023-01-06 DIAGNOSIS — E785 Hyperlipidemia, unspecified: Secondary | ICD-10-CM

## 2023-01-06 DIAGNOSIS — E538 Deficiency of other specified B group vitamins: Secondary | ICD-10-CM

## 2023-01-06 DIAGNOSIS — E669 Obesity, unspecified: Secondary | ICD-10-CM

## 2023-01-06 DIAGNOSIS — E1169 Type 2 diabetes mellitus with other specified complication: Secondary | ICD-10-CM

## 2023-01-06 DIAGNOSIS — E119 Type 2 diabetes mellitus without complications: Secondary | ICD-10-CM

## 2023-01-06 DIAGNOSIS — E1165 Type 2 diabetes mellitus with hyperglycemia: Secondary | ICD-10-CM

## 2023-01-06 DIAGNOSIS — E559 Vitamin D deficiency, unspecified: Secondary | ICD-10-CM | POA: Diagnosis not present

## 2023-01-06 DIAGNOSIS — Z6836 Body mass index (BMI) 36.0-36.9, adult: Secondary | ICD-10-CM

## 2023-01-06 MED ORDER — VITAMIN D (ERGOCALCIFEROL) 1.25 MG (50000 UNIT) PO CAPS: 50000.0000 [IU] | ORAL_CAPSULE | ORAL | 0 refills | Status: DC

## 2023-01-06 MED ORDER — RYBELSUS 14 MG PO TABS: 14.0000 mg | ORAL_TABLET | Freq: Every day | ORAL | 0 refills | Status: DC

## 2023-01-06 NOTE — Progress Notes (Signed)
.smr  Office: [phone]  /  Fax: [phone]  WEIGHT SUMMARY AND BIOMETRICS  Anthropometric Measurements Height: 6\' 1"  (1.854 m) Weight: 279 lb (126.6 kg) BMI (Calculated): 36.82 Weight at Last Visit: 283 lb Weight Lost Since Last Visit: 4 lb Weight Gained Since Last Visit: 0 Starting Weight: 313 lb Total Weight Loss (lbs): 34 lb (15.4 kg)   Body Composition  Body Fat %: 36.4 % Fat Mass (lbs): 101.6 lbs Muscle Mass (lbs): 169 lbs Total Body Water (lbs): 124 lbs Visceral Fat Rating : 21   Other Clinical Data Fasting: Yes Labs: Yes Today's Visit #: 19 Starting Date: 09/25/21    Chief Complaint: OBESITY   History of Present Illness   The patient, with a history of obesity, vitamin D deficiency, hypertension, type 2 diabetes, and hyperlipidemia, presents for a routine follow-up. He has been adhering to his prescribed medication regimen, which includes vitamin D, lisinopril, hydrochlorothiazide, metformin, Rebif, and Crestor. The patient reports a weight loss of 4 pounds over the past month, despite the holiday season. He has been following a low-carb diet about 50% of the time but is not currently engaging in regular exercise.  The patient has been experiencing increased stress levels, not related to the holiday season, but due to other unspecified issues. He reports doing well with protein intake but struggles with vegetable consumption. He has been trying to incorporate more raw vegetables as snacks into his diet.  The patient has not been experiencing any side effects from his medications. However, he reports a decreased appetite, to the point where he has to force himself to eat. He has been grazing throughout the day, eating smaller portions at a time. He has been avoiding sweets and has not experienced any significant fluctuations in his blood sugar levels.  The patient's wife, who also has diabetes, recently started on Mounjaro but has been experiencing significant  GI side effects. The patient has been supportive and involved in her care. He plans to take some time off and go on day trips to help manage his stress levels.          PHYSICAL EXAM:  Blood pressure 107/63, pulse 79, temperature 97.6 F (36.4 C), height 6\' 1"  (1.854 m), weight 279 lb (126.6 kg), SpO2 96%. Body mass index is 36.81 kg/m.  DIAGNOSTIC DATA REVIEWED:  BMET    Component Value Date/Time   NA 136 07/09/2022 1318   NA 142 02/27/2022 0813   K 3.9 07/09/2022 1318   CL 101 07/09/2022 1318   CO2 25 07/09/2022 1318   GLUCOSE 138 (H) 07/09/2022 1318   BUN 13 07/09/2022 1318   BUN 21 02/27/2022 0813   CREATININE 0.94 07/09/2022 1318   CALCIUM 9.6 07/09/2022 1318   GFRNONAA >60 07/09/2022 1318   GFRAA 95 12/08/2007 0958   Lab Results  Component Value Date   HGBA1C 6.5 (A) 09/23/2022   HGBA1C 6.2 (H) 12/15/2007   Lab Results  Component Value Date   INSULIN 18.0 02/27/2022   INSULIN 41.9 (H) 09/25/2021   Lab Results  Component Value Date   TSH 1.350 09/25/2021   CBC    Component Value Date/Time   WBC 9.9 07/09/2022 1318   RBC 5.43 07/09/2022 1318   HGB 16.2 07/09/2022 1318   HGB 15.9 09/25/2021 1010   HCT 47.4 07/09/2022 1318   HCT 46.5 09/25/2021 1010   PLT 269 07/09/2022 1318   MCV 87.3 07/09/2022 1318   MCV 91 09/25/2021 1010   MCH 29.8 07/09/2022  1318   MCHC 34.2 07/09/2022 1318   RDW 13.2 07/09/2022 1318   RDW 13.1 09/25/2021 1010   Iron Studies    Component Value Date/Time   IRON 94 04/12/2021 1115   FERRITIN 278.7 04/12/2021 1115   Lipid Panel     Component Value Date/Time   CHOL 122 02/27/2022 0813   TRIG 69 02/27/2022 0813   HDL 42 02/27/2022 0813   CHOLHDL 3 03/13/2021 0906   VLDL 25.8 03/13/2021 0906   LDLCALC 66 02/27/2022 0813   LDLCALC 117 (H) 03/25/2019 1541   Hepatic Function Panel     Component Value Date/Time   PROT 7.6 07/09/2022 1318   PROT 7.0 02/27/2022 0813   ALBUMIN 4.4 07/09/2022 1318   ALBUMIN 4.3 02/27/2022  0813   AST 16 07/09/2022 1318   ALT 18 07/09/2022 1318   ALKPHOS 42 07/09/2022 1318   BILITOT 1.1 07/09/2022 1318   BILITOT 0.5 02/27/2022 0813   BILIDIR 0.2 03/25/2021 0903      Component Value Date/Time   TSH 1.350 09/25/2021 1010   Nutritional Lab Results  Component Value Date   VD25OH 41.5 02/27/2022   VD25OH 19.9 (L) 09/25/2021     Assessment and Plan    Type 2 Diabetes Mellitus On metformin and Rybelsus. No recent issues with blood sugar levels or hypoglycemic episodes. Managing lack of hunger by scheduling meals and snacks. Chose Rybelsus for its lower nausea effects. Emphasized timing to minimize side effects. - Order labs to monitor diabetes control - Refill Rybelsus prescription  Hypertension Well-controlled with current BP of 107/63 mmHg. On lisinopril and hydrochlorothiazide. Due for labs. - Order labs to monitor blood pressure control  Hyperlipidemia On Crestor. Due for labs to monitor lipid levels. - Order labs to monitor lipid levels  Obesity Following a low-carb diet 50% of the time, lost 4 pounds in the last month. Not exercising. Struggles with vegetable intake but good protein intake. Discussed increasing vegetable intake to four servings per day and strategies for incorporating vegetables. Emphasized regular exercise. - Encourage continued adherence to low-carb diet - Recommend increasing vegetable intake to four servings per day - Discuss strategies for incorporating vegetables into meals - Encourage regular exercise  Vitamin D Deficiency On prescription vitamin D. Due for labs. - Order labs to check vitamin D levels - Refill vitamin D prescription  General Health Maintenance Due for routine labs to monitor chronic conditions including cholesterol, blood pressure, vitamin D, B12, and diabetes. - Order labs for cholesterol, blood pressure, vitamin D, B12, and diabetes  Follow-up - Schedule follow-up appointment for February.        He was  informed of the importance of frequent follow up visits to maximize his success with intensive lifestyle modifications for his multiple health conditions.    Quillian Quince, MD

## 2023-01-07 LAB — CMP14+EGFR
ALT: 19 [IU]/L (ref 0–44)
AST: 18 [IU]/L (ref 0–40)
Albumin: 4.2 g/dL (ref 3.8–4.9)
Alkaline Phosphatase: 52 [IU]/L (ref 44–121)
BUN/Creatinine Ratio: 13 (ref 9–20)
BUN: 14 mg/dL (ref 6–24)
Bilirubin Total: 0.6 mg/dL (ref 0.0–1.2)
CO2: 24 mmol/L (ref 20–29)
Calcium: 9.4 mg/dL (ref 8.7–10.2)
Chloride: 104 mmol/L (ref 96–106)
Creatinine, Ser: 1.05 mg/dL (ref 0.76–1.27)
Globulin, Total: 2.4 g/dL (ref 1.5–4.5)
Glucose: 122 mg/dL — ABNORMAL HIGH (ref 70–99)
Potassium: 4.5 mmol/L (ref 3.5–5.2)
Sodium: 142 mmol/L (ref 134–144)
Total Protein: 6.6 g/dL (ref 6.0–8.5)
eGFR: 83 mL/min/{1.73_m2} (ref >59)

## 2023-01-07 LAB — LIPID PANEL WITH LDL/HDL RATIO
Cholesterol, Total: 108 mg/dL (ref 100–199)
HDL: 37 mg/dL — ABNORMAL LOW (ref >39)
LDL Chol Calc (NIH): 51 mg/dL (ref 0–99)
LDL/HDL Ratio: 1.4 {ratio} (ref 0.0–3.6)
Triglycerides: 106 mg/dL (ref 0–149)
VLDL Cholesterol Cal: 20 mg/dL (ref 5–40)

## 2023-01-07 LAB — HEMOGLOBIN A1C
Est. average glucose Bld gHb Est-mCnc: 134 mg/dL
Hgb A1c MFr Bld: 6.3 % — ABNORMAL HIGH (ref 4.8–5.6)

## 2023-01-07 LAB — VITAMIN B12: Vitamin B-12: 300 pg/mL (ref 232–1245)

## 2023-01-07 LAB — VITAMIN D 25 HYDROXY (VIT D DEFICIENCY, FRACTURES): Vit D, 25-Hydroxy: 38.4 ng/mL (ref 30.0–100.0)

## 2023-01-07 LAB — INSULIN, RANDOM: INSULIN: 39.7 u[IU]/mL — ABNORMAL HIGH (ref 2.6–24.9)

## 2023-01-27 ENCOUNTER — Encounter (INDEPENDENT_AMBULATORY_CARE_PROVIDER_SITE_OTHER): Payer: Self-pay | Admitting: Family Medicine

## 2023-01-27 ENCOUNTER — Ambulatory Visit (INDEPENDENT_AMBULATORY_CARE_PROVIDER_SITE_OTHER): Payer: 59 | Admitting: Family Medicine

## 2023-01-27 VITALS — BP 125/75 | HR 80 | Temp 97.8°F | Ht 73.0 in | Wt 280.0 lb

## 2023-01-27 DIAGNOSIS — Z7984 Long term (current) use of oral hypoglycemic drugs: Secondary | ICD-10-CM

## 2023-01-27 DIAGNOSIS — E559 Vitamin D deficiency, unspecified: Secondary | ICD-10-CM | POA: Diagnosis not present

## 2023-01-27 DIAGNOSIS — E538 Deficiency of other specified B group vitamins: Secondary | ICD-10-CM | POA: Diagnosis not present

## 2023-01-27 DIAGNOSIS — E119 Type 2 diabetes mellitus without complications: Secondary | ICD-10-CM

## 2023-01-27 DIAGNOSIS — E669 Obesity, unspecified: Secondary | ICD-10-CM

## 2023-01-27 DIAGNOSIS — E1165 Type 2 diabetes mellitus with hyperglycemia: Secondary | ICD-10-CM

## 2023-01-27 DIAGNOSIS — I10 Essential (primary) hypertension: Secondary | ICD-10-CM

## 2023-01-27 DIAGNOSIS — Z6836 Body mass index (BMI) 36.0-36.9, adult: Secondary | ICD-10-CM

## 2023-01-27 MED ORDER — RYBELSUS 14 MG PO TABS: 14.0000 mg | ORAL_TABLET | Freq: Every day | ORAL | 0 refills | Status: DC

## 2023-01-27 MED ORDER — VITAMIN D (ERGOCALCIFEROL) 1.25 MG (50000 UNIT) PO CAPS: 50000.0000 [IU] | ORAL_CAPSULE | ORAL | 0 refills | Status: DC

## 2023-01-27 NOTE — Progress Notes (Signed)
 .smr  Office: [phone]  /  Fax: [phone]  WEIGHT SUMMARY AND BIOMETRICS  Anthropometric Measurements Height: 6' 1 (1.854 m) Weight: 280 lb (127 kg) BMI (Calculated): 36.95 Weight at Last Visit: 279 lb Weight Lost Since Last Visit: 0 Weight Gained Since Last Visit: 1 lb Starting Weight: 313 lb Total Weight Loss (lbs): 33 lb (15 kg)   Body Composition  Body Fat %: 36.3 % Fat Mass (lbs): 101.6 lbs Muscle Mass (lbs): 169.6 lbs Total Body Water (lbs): 124.8 lbs Visceral Fat Rating : 21   Other Clinical Data Fasting: No Labs: No Today's Visit #: 20 Starting Date: 09/25/21    Chief Complaint: OBESITY   History of Present Illness   The patient, diagnosed with type 2 diabetes, vitamin D  deficiency, obesity, and hypertension, presents for a follow-up consultation. He has been adhering to a low-carb diet and exercise regimen, albeit inconsistently, and has gained one pound over the past three weeks, which included the holiday season. He reports a recent illness, described as a head cold, which has persisted for three weeks. The patient's primary symptoms include nasal congestion and a tickling cough, but he has not taken any medication for these symptoms.  Regarding his diabetes management, the patient is on metformin  and rybelsus . He has been working on improving his diet and weight loss, but reports difficulty adhering to the low-carb plan due to limited food options and monotony. He expresses a willingness to switch to a category plan for dietary management.  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 He expresses a desire to incorporate more walking into his exercise routine, but also acknowledges the need to challenge his muscles as he loses weight.  In terms of medication, the patient requests a refill of his vitamin D   supplement. He also reports a decrease in appetite, particularly for eggs, which he attributes to his current medication. Despite these challenges, the patient remains committed to improving his health and managing his conditions.          PHYSICAL EXAM:  Blood pressure 125/75, pulse 80, temperature 97.8 F (36.6 C), height 6' 1 (1.854 m), weight 280 lb (127 kg), SpO2 98%. Body mass index is 36.94 kg/m.  DIAGNOSTIC DATA REVIEWED:  BMET    Component Value Date/Time   NA 142 01/06/2023 0847   K 4.5 01/06/2023 0847   CL 104 01/06/2023 0847   CO2 24 01/06/2023 0847   GLUCOSE 122 (H) 01/06/2023 0847   GLUCOSE 138 (H) 07/09/2022 1318   BUN 14 01/06/2023 0847   CREATININE 1.05 01/06/2023 0847   CALCIUM  9.4 01/06/2023 0847   GFRNONAA >60 07/09/2022 1318   GFRAA 95 12/08/2007 0958   Lab Results  Component Value Date   HGBA1C 6.3 (H) 01/06/2023   HGBA1C 6.2 (H) 12/15/2007   Lab Results  Component Value Date   INSULIN  39.7 (H) 01/06/2023   INSULIN  41.9 (H) 09/25/2021   Lab Results  Component Value Date   TSH 1.350 09/25/2021   CBC    Component Value Date/Time   WBC 9.9 07/09/2022 1318   RBC 5.43 07/09/2022 1318   HGB 16.2 07/09/2022 1318   HGB 15.9 09/25/2021 1010   HCT 47.4 07/09/2022 1318   HCT 46.5 09/25/2021 1010   PLT 269 07/09/2022 1318   MCV 87.3 07/09/2022 1318   MCV 91 09/25/2021 1010   MCH 29.8  07/09/2022 1318   MCHC 34.2 07/09/2022 1318   RDW 13.2 07/09/2022 1318   RDW 13.1 09/25/2021 1010   Iron Studies    Component Value Date/Time   IRON 94 04/12/2021 1115   FERRITIN 278.7 04/12/2021 1115   Lipid Panel     Component Value Date/Time   CHOL 108 01/06/2023 0847   TRIG 106 01/06/2023 0847   HDL 37 (L) 01/06/2023 0847   CHOLHDL 3 03/13/2021 0906   VLDL 25.8 03/13/2021 0906   LDLCALC 51 01/06/2023 0847   LDLCALC 117 (H) 03/25/2019 1541   Hepatic Function Panel     Component Value Date/Time   PROT 6.6 01/06/2023 0847   ALBUMIN 4.2  01/06/2023 0847   AST 18 01/06/2023 0847   ALT 19 01/06/2023 0847   ALKPHOS 52 01/06/2023 0847   BILITOT 0.6 01/06/2023 0847   BILIDIR 0.2 03/25/2021 0903      Component Value Date/Time   TSH 1.350 09/25/2021 1010   Nutritional Lab Results  Component Value Date   VD25OH 38.4 01/06/2023   VD25OH 41.5 02/27/2022   VD25OH 19.9 (L) 09/25/2021     Assessment and Plan    Type 2 Diabetes Mellitus Type 2 diabetes mellitus, managed with metformin  and Rybelsus . Recent A1c improved to 6.3 from 6.5. Fasting glucose around 120 mg/dL. Insulin  levels increased likely due to recent dietary indiscretions. Discussed benefits of switching to a category three diet plan, which may be more sustainable and effective with 85% adherence. - Continue metformin  and Rybelsus  - Switch to category three diet plan - Encourage regular exercise, including walking and use of ankle weights - Monitor blood glucose levels regularly - Recheck A1c in 3 months  Obesity Obesity with recent weight gain of one pound. Following a low-carb diet about 50% of the time and currently exercising. Discussed benefits of switching to a category three diet plan for more variety and easier adherence. - Switch to category three diet plan - Encourage regular exercise, including walking and use of ankle weights - Monitor weight regularly  Hypertension Hypertension, well-controlled with a recent blood pressure reading of 125/80 mmHg. - Continue monitoring blood pressure - Advise to maintain blood pressure below 140/90 mmHg  Vitamin D  Deficiency Vitamin D  deficiency with recent improvement in levels. Requests a refill of vitamin D . - Refill vitamin D  prescription  B12 deficiencies B12 levels have fallen slightly, likely due to dietary changes. Encouraged to continue vitamin D  supplementation and consider B12 supplementation if levels do not improve in 3 months. - Monitor B12 levels and consider supplementation if levels do not  improve in 3 months - Encourage protein intake through diet  Follow-up - Follow up in 1 month - Recheck labs in 3 months, including A1c and B12 levels.          He was informed of the importance of frequent follow up visits to maximize his success with intensive lifestyle modifications for his multiple health conditions.    Louann Penton, MD

## 2023-02-03 ENCOUNTER — Ambulatory Visit
Admission: RE | Admit: 2023-02-03 | Discharge: 2023-02-03 | Disposition: A | Discharge status: Home / Self Care | Payer: 59 | Source: Ambulatory Visit | Attending: Thoracic Surgery (Cardiothoracic Vascular Surgery) | Admitting: Thoracic Surgery (Cardiothoracic Vascular Surgery)

## 2023-02-03 DIAGNOSIS — I7121 Aneurysm of the ascending aorta, without rupture: Secondary | ICD-10-CM

## 2023-02-03 MED ORDER — IOPAMIDOL (ISOVUE-370) INJECTION 76%
100.0000 mL | Freq: Once | INTRAVENOUS | Status: AC | PRN
  Administered 2023-02-03: 75 mL via INTRAVENOUS

## 2023-02-09 ENCOUNTER — Other Ambulatory Visit (INDEPENDENT_AMBULATORY_CARE_PROVIDER_SITE_OTHER): Payer: Self-pay | Admitting: Family Medicine

## 2023-02-09 DIAGNOSIS — E559 Vitamin D deficiency, unspecified: Secondary | ICD-10-CM

## 2023-02-10 ENCOUNTER — Encounter: Payer: Self-pay | Admitting: Thoracic Surgery (Cardiothoracic Vascular Surgery)

## 2023-02-10 ENCOUNTER — Ambulatory Visit (INDEPENDENT_AMBULATORY_CARE_PROVIDER_SITE_OTHER): Payer: 59 | Admitting: Thoracic Surgery (Cardiothoracic Vascular Surgery)

## 2023-02-10 VITALS — BP 122/79 | HR 78 | Resp 18 | Ht 73.0 in | Wt 290.0 lb

## 2023-02-10 DIAGNOSIS — I7121 Aneurysm of the ascending aorta, without rupture: Secondary | ICD-10-CM

## 2023-02-10 NOTE — Progress Notes (Signed)
[address]       [place] [address]             [phone]     HPI: Antonio Horton returns for follow-up of his ascending aneurysm  Antonio Horton is a 58 year old man with a history of obesity, hypertension, hyperlipidemia, sleep apnea, type 2 diabetes without complication, vasovagal syncope, and an ascending aortic aneurysm.  Originally presented with a syncopal episode in November 2022.  Had a CT which showed a 5.2 cm ascending aneurysm.  He has been followed since then but the aneurysm was measured right around 5 cm.  He has been feeling well.  He denies any chest pain, pressure, tightness, shortness of breath, or peripheral edema.  Lost about 30 pounds when initially going on semaglutide.  Weight has been stable since then.  Blood sugars been well-controlled.  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   Borderline diabetes    Cholelithiases 11/25/2020   COVID-19 virus infection 06/14/2020   Diabetes mellitus without complication (HCC)    Hepatic steatosis 12/05/2020   Hyperlipidemia    Hypertension    Hypoxemia 11/25/2020   S/P laparoscopic cholecystectomy 11/27/2020   Sepsis (HCC) 11/25/2020   Sleep apnea    wears CPAP   SOBOE (shortness of breath on exertion) 09/25/2021   Syncope 11/25/2020   Vasovagal reaction      Current Outpatient Medications  Medication Sig Dispense Refill   Continuous Blood Gluc Receiver (FREESTYLE LIBRE 14 DAY READER) DEVI      Continuous Glucose Sensor (FREESTYLE LIBRE 3 SENSOR) MISC PLACE 1 SENSOR ON THE SKIN EVERY 14 DAYS. USE TO CHECK GLUCOSE CONTINUOUSLY 4 each 2   lisinopril-hydrochlorothiazide (ZESTORETIC) 10-12.5 MG tablet TAKE 1 TABLET BY MOUTH EVERY DAY FOR BLOOD PRESSURE 90 tablet 3   metFORMIN (GLUCOPHAGE-XR) 500 MG 24 hr tablet TAKE 2 TABLETS (1,000 MG TOTAL) BY MOUTH DAILY WITH BREAKFAST. FOR DIABETES. 180 tablet 1   prednisoLONE acetate (PRED FORTE) 1 %  ophthalmic suspension Place 1 drop into the left eye daily.      rosuvastatin (CRESTOR) 5 MG tablet TAKE 1 TABLET (5 MG TOTAL) BY MOUTH DAILY FOR CHOLESTEROL 90 tablet 2   Semaglutide (RYBELSUS) 14 MG TABS Take 1 tablet (14 mg total) by mouth daily. 30 tablet 0   Vitamin D, Ergocalciferol, (DRISDOL) 1.25 MG (50000 UNIT) CAPS capsule Take 1 capsule (50,000 Units total) by mouth every 7 (seven) days. 4 capsule 0   No current facility-administered medications for this visit.    Physical Exam BP 122/79 (BP Location: Left Arm)   Pulse 78   Resp 18   Ht 6\' 1"  (1.854 m)   Wt 290 lb (131.5 kg)   SpO2 98% Comment: RA  BMI 38.26 kg/m  Well-appearing 58 year old man in no acute distress Alert and oriented x 3 with no focal deficits Lungs clear Cardiac regular rate and rhythm, no murmur No carotid bruits No peripheral edema  Diagnostic Tests: CT ANGIOGRAPHY CHEST WITH CONTRAST   TECHNIQUE: Multidetector CT imaging of the chest was performed using the standard protocol during bolus administration of intravenous contrast. Multiplanar CT image reconstructions and MIPs were obtained to evaluate the vascular anatomy.   RADIATION DOSE REDUCTION: This exam was performed according to the departmental dose-optimization program which includes automated exposure control, adjustment of the mA and/or kV according to patient size and/or use of iterative reconstruction technique.   CONTRAST:  75mL ISOVUE-370  IOPAMIDOL (ISOVUE-370) INJECTION 76%   COMPARISON:  CT 08/01/2022, 01/23/2022, 07/04/2021   FINDINGS: Cardiovascular: No dissection is seen. Normal cardiac size. No pericardial effusion. Common origin of the right brachiocephalic and left common carotid arteries from left-sided aortic arch.   Sinus of Valsalva measures 4.1 cm compared with 4 cm previously. Sinotubular junction measures 3.3 cm compared with 3.3 cm previously. Mid ascending aortic diameter at the level of the right pulmonary  artery measures 5 x 5 cm, previously 5 cm. Transverse arch measurement of 3 cm, previously 3 cm. Proximal descending thoracic aortic diameter at the level of right pulmonary artery measures 2.6 cm compared with 2.6 cm previously.   Mediastinum/Nodes: Patent trachea. No suspicious thyroid mass. No abnormal adenopathy. Esophagus within normal limits.   Lungs/Pleura: Lungs are clear. No pleural effusion or pneumothorax.   Upper Abdomen: No acute finding.  Hepatic cyst.   Musculoskeletal: No acute or suspicious osseous abnormality.   Review of the MIP images confirms the above findings.   IMPRESSION: 1. Stable 5 cm ascending aortic aneurysm, previously 5 cm. Ascending thoracic aortic aneurysm. Recommend semi-annual imaging followup by CTA or MRA. This recommendation follows 2010 ACCF/AHA/AATS/ACR/ASA/SCA/SCAI/SIR/STS/SVM Guidelines for the Diagnosis and Management of Patients With Thoracic Aortic Disease. Circulation. 2010; 121: Z610-R604. Aortic aneurysm NOS (ICD10-I71.9) 2. No CT evidence for acute intrathoracic abnormality.   Aortic aneurysm NOS (ICD10-I71.9).     Electronically Signed   By: Jasmine Pang M.D.   On: 02/03/2023 16:06   I personally reviewed the CT images.  There is a stable 5 cm ascending aneurysm, normal sinotubular junction morphology.  Impression: Antonio Horton is a 58 year old man with a history of obesity, hypertension, hyperlipidemia, sleep apnea, type 2 diabetes without complication, vasovagal syncope, and an ascending aortic aneurysm.  Ascending aneurysm-stable 5 cm aneurysm.  Needs continued semiannual follow-up.  Hypertension-blood pressure well-controlled on Zestoretic.  Managed by primary  Type 2 diabetes-on metformin and semaglutide Plan: Return in 6 months with CT angiogram of chest  Loreli Slot, MD [facility] [phone]

## 2023-02-14 ENCOUNTER — Other Ambulatory Visit: Payer: Self-pay | Admitting: Primary Care

## 2023-02-14 DIAGNOSIS — E7800 Pure hypercholesterolemia, unspecified: Secondary | ICD-10-CM

## 2023-03-03 ENCOUNTER — Encounter (INDEPENDENT_AMBULATORY_CARE_PROVIDER_SITE_OTHER): Payer: Self-pay | Admitting: Family Medicine

## 2023-03-03 ENCOUNTER — Ambulatory Visit (INDEPENDENT_AMBULATORY_CARE_PROVIDER_SITE_OTHER): Payer: 59 | Admitting: Family Medicine

## 2023-03-03 VITALS — BP 110/66 | HR 76 | Temp 97.7°F | Ht 73.0 in | Wt 279.0 lb

## 2023-03-03 DIAGNOSIS — E785 Hyperlipidemia, unspecified: Secondary | ICD-10-CM | POA: Diagnosis not present

## 2023-03-03 DIAGNOSIS — E1165 Type 2 diabetes mellitus with hyperglycemia: Secondary | ICD-10-CM

## 2023-03-03 DIAGNOSIS — I10 Essential (primary) hypertension: Secondary | ICD-10-CM

## 2023-03-03 DIAGNOSIS — E119 Type 2 diabetes mellitus without complications: Secondary | ICD-10-CM

## 2023-03-03 DIAGNOSIS — Z6836 Body mass index (BMI) 36.0-36.9, adult: Secondary | ICD-10-CM

## 2023-03-03 DIAGNOSIS — E669 Obesity, unspecified: Secondary | ICD-10-CM

## 2023-03-03 DIAGNOSIS — E7849 Other hyperlipidemia: Secondary | ICD-10-CM

## 2023-03-03 DIAGNOSIS — Z7984 Long term (current) use of oral hypoglycemic drugs: Secondary | ICD-10-CM

## 2023-03-03 DIAGNOSIS — E559 Vitamin D deficiency, unspecified: Secondary | ICD-10-CM

## 2023-03-03 MED ORDER — RYBELSUS 14 MG PO TABS: 14.0000 mg | ORAL_TABLET | Freq: Every day | ORAL | 0 refills | Status: DC

## 2023-03-03 MED ORDER — VITAMIN D (ERGOCALCIFEROL) 1.25 MG (50000 UNIT) PO CAPS: 50000.0000 [IU] | ORAL_CAPSULE | ORAL | 0 refills | Status: DC

## 2023-03-03 NOTE — Progress Notes (Signed)
.smr  Office: [phone]  /  Fax: [phone]  WEIGHT SUMMARY AND BIOMETRICS  Anthropometric Measurements Height: 6\' 1"  (1.854 m) Weight: 279 lb (126.6 kg) BMI (Calculated): 36.82 Weight at Last Visit: 280 lb Weight Lost Since Last Visit: 1 lb Weight Gained Since Last Visit: 0 Starting Weight: 313 lb Total Weight Loss (lbs): 34 lb (15.4 kg)   Body Composition  Body Fat %: 36.8 % Fat Mass (lbs): 102.8 lbs Muscle Mass (lbs): 168 lbs Total Body Water (lbs): 126.8 lbs Visceral Fat Rating : 21   Other Clinical Data Fasting: No Labs: No Today's Visit #: 21 Starting Date: 09/25/21    Chief Complaint: OBESITY  History of Present Illness   Antonio Horton is a 58 year old male with obesity, type two diabetes, hypertension, hyperlipidemia, and vitamin D deficiency who presents for management of these conditions.  He is actively working on diet, exercise, and weight loss to manage his obesity. He has lost one pound in the last month and follows his category three eating plan about half the time. He is not currently exercising, and the cold weather has affected his joints, making it difficult to move around some days. His knees 'usually grind instead of crack,' indicating discomfort with certain movements.  For his type two diabetes, he is on metformin and Rybelsus, and he requires a refill of Rybelsus today. His blood pressure is well controlled at 110/66 mmHg, and he is currently taking lisinopril hydrochlorothiazide. He is also on Crestor for hyperlipidemia, with his last LDL level well controlled at 51 mg/dL.  He is on prescription vitamin D for his deficiency, but his last vitamin D level was not yet at goal.  He experiences a lot of stress over the past month, which has impacted his eating habits, often leading to 'a lot of not eating.' This stress is partly due to his wife's illness, as she has been sick for three months after starting [facility] for her own health issues.           PHYSICAL EXAM:  Blood pressure 110/66, pulse 76, temperature 97.7 F (36.5 C), height 6\' 1"  (1.854 m), weight 279 lb (126.6 kg), SpO2 98%. Body mass index is 36.81 kg/m.  DIAGNOSTIC DATA REVIEWED:  BMET    Component Value Date/Time   NA 142 01/06/2023 0847   K 4.5 01/06/2023 0847   CL 104 01/06/2023 0847   CO2 24 01/06/2023 0847   GLUCOSE 122 (H) 01/06/2023 0847   GLUCOSE 138 (H) 07/09/2022 1318   BUN 14 01/06/2023 0847   CREATININE 1.05 01/06/2023 0847   CALCIUM 9.4 01/06/2023 0847   GFRNONAA >60 07/09/2022 1318   GFRAA 95 12/08/2007 0958   Lab Results  Component Value Date   HGBA1C 6.3 (H) 01/06/2023   HGBA1C 6.2 (H) 12/15/2007   Lab Results  Component Value Date   INSULIN 39.7 (H) 01/06/2023   INSULIN 41.9 (H) 09/25/2021   Lab Results  Component Value Date   TSH 1.350 09/25/2021   CBC    Component Value Date/Time   WBC 9.9 07/09/2022 1318   RBC 5.43 07/09/2022 1318   HGB 16.2 07/09/2022 1318   HGB 15.9 09/25/2021 1010   HCT 47.4 07/09/2022 1318   HCT 46.5 09/25/2021 1010   PLT 269 07/09/2022 1318   MCV 87.3 07/09/2022 1318   MCV 91 09/25/2021 1010   MCH 29.8 07/09/2022 1318   MCHC 34.2 07/09/2022 1318   RDW 13.2 07/09/2022 1318   RDW 13.1 09/25/2021 1010  Iron Studies    Component Value Date/Time   IRON 94 04/12/2021 1115   FERRITIN 278.7 04/12/2021 1115   Lipid Panel     Component Value Date/Time   CHOL 108 01/06/2023 0847   TRIG 106 01/06/2023 0847   HDL 37 (L) 01/06/2023 0847   CHOLHDL 3 03/13/2021 0906   VLDL 25.8 03/13/2021 0906   LDLCALC 51 01/06/2023 0847   LDLCALC 117 (H) 03/25/2019 1541   Hepatic Function Panel     Component Value Date/Time   PROT 6.6 01/06/2023 0847   ALBUMIN 4.2 01/06/2023 0847   AST 18 01/06/2023 0847   ALT 19 01/06/2023 0847   ALKPHOS 52 01/06/2023 0847   BILITOT 0.6 01/06/2023 0847   BILIDIR 0.2 03/25/2021 0903      Component Value Date/Time   TSH 1.350 09/25/2021 1010    Nutritional Lab Results  Component Value Date   VD25OH 38.4 01/06/2023   VD25OH 41.5 02/27/2022   VD25OH 19.9 (L) 09/25/2021     Assessment and Plan    Obesity He has lost one pound in the last month. He follows his category three eating plan about half the time and is not currently exercising. Stress due to his wife's illness has impacted his eating habits. Discussed the importance of low-impact exercises and provided specific instructions for using exercise bands and online resources for chair exercises. - Encourage continuation of diet and weight loss efforts - Recommend starting a routine of low-impact exercises such as walking and using exercise bands - Provide exercise band instructions and suggest online resources for chair exercises  Type 2 Diabetes Mellitus He is on metformin and Rybelsus. His diabetes management is part of his overall weight loss and lifestyle modification plan. Discussed the importance of medication adherence and the potential benefits of weight loss on glycemic control. - Refill Rybelsus prescription -Continue diet/exercise/weight loss efforts  Hypertension His blood pressure is well controlled at 110/66 mmHg. He is on lisinopril hydrochlorothiazide and continues to work on weight loss to improve his hypertension. Discussed the benefits of weight loss on blood pressure control. - Continue current antihypertensive medication regimen -Continue diet/exercise/weight loss efforts  Hyperlipidemia He is on Crestor. His last LDL was well controlled at 51 mg/dL. Discussed the benefits of maintaining current statin therapy and the positive impact of weight loss on lipid levels. - Continue current statin therapy -Continue diet/exercise/weight loss efforts  Vitamin D Deficiency He is on prescription vitamin D. His last vitamin D level was not yet at goal. Discussed the importance of continuing vitamin D therapy and monitoring levels. - Continue prescription  vitamin D therapy  Follow-up - Schedule follow-up appointment in four weeks.        He was informed of the importance of frequent follow up visits to maximize his success with intensive lifestyle modifications for his multiple health conditions.    Quillian Quince, MD

## 2023-03-13 ENCOUNTER — Telehealth (INDEPENDENT_AMBULATORY_CARE_PROVIDER_SITE_OTHER): Payer: 59 | Admitting: Endocrinology

## 2023-03-13 ENCOUNTER — Encounter: Payer: Self-pay | Admitting: Endocrinology

## 2023-03-13 DIAGNOSIS — E042 Nontoxic multinodular goiter: Secondary | ICD-10-CM | POA: Diagnosis not present

## 2023-03-13 NOTE — Progress Notes (Signed)
 Outpatient Endocrinology Note Antonio Mariafernanda Hendricksen, MD   Patient's Name: Antonio Horton    DOB: 20-Feb-1965    MRN: [id number]  I connected with  Lawana Chambers on 03/13/23 by a video enabled telemedicine application and verified that I am speaking with the correct person using two identifiers.   I discussed the limitations of evaluation and management by telemedicine. The patient expressed understanding and agreed to proceed.   Provider location : Office Patient location : At home Reason for virtual visit : Weather / winter storm  REASON OF VISIT:  Follow-up for thyroid nodules   PCP: Doreene Nest, NP  HISTORY OF PRESENT ILLNESS:   Antonio Horton is a 58 y.o. old male with past medical history as listed below is presented for new consult for thyroid nodules.  Pertinent Thyroid History:  Patient thyroid enlargement was first discovered incidentally when he had CT scan of the neck in November 2022 showing multiple thyroid nodules.  Subsequently had ultrasound thyroid in November 2022 showed multinodular thyroid gland dominant left superior thyroid nodule solid hypoechoic measuring 1.5 cm and in bilateral other multiple thyroid nodules 0.8 to 1.5 cm size range.  Patient had FNA of left superior solid hypoechoic thyroid nodule measuring 1.5 cm in December 2022 showed follicular lesion of undetermined significance and Afirma testing was negative.  Patient has no neck compressive symptoms.  He is euthyroid, not on thyroid medication.  No family history of thyroid disorder.   Patient had repeat ultrasound thyroid in February 2024 : Decrease in size of previously biopsied thyroid nodule and bilateral stable multiple thyroid nodules, mostly in the range of 1 to 1.5 cm in size.  CLINICAL DATA:  Goiter. History of prior biopsy of TI-RADS category 4 nodule in the left mid to upper gland.   EXAM: March 03, 2022 THYROID ULTRASOUND   TECHNIQUE: Ultrasound examination of the  thyroid gland and adjacent soft tissues was performed.   COMPARISON:  Prior thyroid ultrasound 12/18/2020   FINDINGS: Parenchymal Echotexture: Moderately heterogenous   Isthmus: 0.3 cm   Right lobe: 5.6 x 1.8 x 1.7 cm   Left lobe: 5.5 x 1.9 x 1.6 cm   _________________________________________________________   Estimated total number of nodules >/= 1 cm: 6-10   Number of spongiform nodules >/=  2 cm not described below (TR1): 0   Number of mixed cystic and solid nodules >/= 1.5 cm not described below (TR2): 0   _________________________________________________________   Heterogeneous thyroid gland with extensive nodules. The majority of the nodules all measure less than 1 cm in size and do not warrant further evaluation.   _________________________________________________________   Nodule # 3: Hypoechoic nodule in the right mid gland remains stable at 1.2 x 1.0 x 0.9 cm compared to 1.2 x 1.2 x 0.9 cm previously. TI-RADS category 4. *Given size (>/= 1 - 1.4 cm) and appearance, a follow-up ultrasound in 1 year should be considered based on TI-RADS criteria.   _________________________________________________________   Nodule # 5: Stable hypoechoic solid nodule in the right lower gland at 1.0 x 1.0 x 0.8 cm compared to 1.1 x 1.1 x 0.8 cm previously. TI-RADS category 4. *Given size (>/= 1 - 1.4 cm) and appearance, a follow-up ultrasound in 1 year should be considered based on TI-RADS criteria.   _________________________________________________________   Nodule # 6: Continued stability of 1.1 x 0.9 x 0.7 cm solid hypoechoic nodule in the right inferior gland. TI-RADS category 4. *Given size (>/= 1 - 1.4 cm) and appearance, a  follow-up ultrasound in 1 year should be considered based on TI-RADS criteria.   _________________________________________________________   Nodule # 7: Previously biopsied nodule in the left mid to upper gland has involuted and now measures only  1.4 x 1.0 x 1.0 cm compared to 1.5 x 1.5 x 0.9 cm previously.   _________________________________________________________   Nodule # 10: Solid hypoechoic nodule in the left lower gland is unchanged at 1.4 x 1.0 x 0.8 cm. TI-RADS category 4. *Given size (>/= 1 - 1.4 cm) and appearance, a follow-up ultrasound in 1 year should be considered based on TI-RADS criteria.   _________________________________________________________   IMPRESSION: 1. Interval involution of previously biopsied nodule in the left mid to upper gland. Findings are consistent with benignity. 2. Small TI-RADS category 4 nodules (# 3, # 5, # 6 and # 10) are all stable in size and appearance and continue to meet criteria for follow-up imaging. Recommend follow-up ultrasound in 1 year.  Interval history  Patient denies any neck discomfort or neck compressive symptoms.  Fair and normal energy level, no heat and cold intolerance.  No palpitation.  No change in bowel habit.  No hypo and hyperthyroid symptoms.  No other complaints today.  REVIEW OF SYSTEMS:  As per history of present illness.   PAST MEDICAL HISTORY: 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   Borderline diabetes    Cholelithiases 11/25/2020   COVID-19 virus infection 06/14/2020   Diabetes mellitus without complication (HCC)    Hepatic steatosis 12/05/2020   Hyperlipidemia    Hypertension    Hypoxemia 11/25/2020   S/P laparoscopic cholecystectomy 11/27/2020   Sepsis (HCC) 11/25/2020   Sleep apnea    wears CPAP   SOBOE (shortness of breath on exertion) 09/25/2021   Syncope 11/25/2020   Vasovagal reaction     PAST SURGICAL HISTORY: Past Surgical History:  Procedure Laterality Date   CHOLECYSTECTOMY N/A 11/27/2020   Procedure: LAPAROSCOPIC CHOLECYSTECTOMY;  Surgeon: Violeta Gelinas, MD;  Location: [facility] OR;  Service: General;  Laterality: N/A;   EYE SURGERY     Corneal dystrophies     ALLERGIES: 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    FAMILY HISTORY:  Family History  Adopted: Yes  Problem Relation Age of Onset   Other Other        adopted   Thyroid disease Neg Hx     SOCIAL HISTORY: Social History   Socioeconomic History   Marital status: Married    Spouse name: Not on file   Number of children: Not on file   Years of education: Not on file   Highest education level: Not on file  Occupational History   Not on file  Tobacco Use   Smoking status: Never   Smokeless tobacco: Never  Vaping Use   Vaping status: Never Used  Substance and Sexual Activity   Alcohol use: Not Currently    Alcohol/week: 0.0 standard drinks of alcohol    Comment: socially   Drug use: No   Sexual activity: Not on file  Other Topics Concern   Not on file  Social History Narrative   Married.   2 children.   Works as a Games developer.   Enjoys reading.    Social Drivers of Corporate investment banker Strain: Not on file  Food Insecurity: Not on file  Transportation Needs: Not on file  Physical Activity: Not on file  Stress:  Not on file  Social Connections: Not on file    MEDICATIONS:  Current Outpatient Medications  Medication Sig Dispense Refill   Continuous Blood Gluc Receiver (FREESTYLE LIBRE 14 DAY READER) DEVI      Continuous Glucose Sensor (FREESTYLE LIBRE 3 SENSOR) MISC PLACE 1 SENSOR ON THE SKIN EVERY 14 DAYS. USE TO CHECK GLUCOSE CONTINUOUSLY 4 each 2   lisinopril-hydrochlorothiazide (ZESTORETIC) 10-12.5 MG tablet TAKE 1 TABLET BY MOUTH EVERY DAY FOR BLOOD PRESSURE 90 tablet 3   metFORMIN (GLUCOPHAGE-XR) 500 MG 24 hr tablet TAKE 2 TABLETS (1,000 MG TOTAL) BY MOUTH DAILY WITH BREAKFAST. FOR DIABETES. 180 tablet 1   prednisoLONE acetate (PRED FORTE) 1 % ophthalmic suspension Place 1 drop into the left eye daily.      rosuvastatin (CRESTOR) 5 MG tablet TAKE 1 TABLET (5 MG TOTAL) BY MOUTH DAILY FOR CHOLESTEROL 90 tablet 0    Semaglutide (RYBELSUS) 14 MG TABS Take 1 tablet (14 mg total) by mouth daily. 30 tablet 0   Vitamin D, Ergocalciferol, (DRISDOL) 1.25 MG (50000 UNIT) CAPS capsule Take 1 capsule (50,000 Units total) by mouth every 7 (seven) days. 4 capsule 0   No current facility-administered medications for this visit.    PHYSICAL EXAM: There were no vitals filed for this visit. There is no height or weight on file to calculate BMI.  Wt Readings from Last 3 Encounters:  03/03/23 279 lb (126.6 kg)  02/10/23 290 lb (131.5 kg)  01/27/23 280 lb (127 kg)     General: Well developed, well nourished male in no apparent distress.  HEENT: AT/[place], no external lesions. Hearing intact to the spoken word Neurologic: Alert, oriented, normal speech Psychiatric: Does not appear depressed or anxious  PERTINENT HISTORIC LABORATORY AND IMAGING STUDIES:  All pertinent laboratory results were reviewed. Please see HPI also for further details.   TSH  Date Value Ref Range Status  09/25/2021 1.350 0.450 - 4.500 uIU/mL Final  12/04/2020 1.60 0.35 - 5.50 uIU/mL Final  09/08/2006 1.41 0.35 - 5.50 microintl units/mL Final   T3, Total  Date Value Ref Range Status  09/25/2021 137 71 - 180 ng/dL Final     ASSESSMENT / PLAN  1. Multiple thyroid nodules   2. Multinodular goiter    -Patient was incidentally found to have thyroid nodules on CT scan in November 2022, subsequent ultrasound thyroid showed bilateral multiple thyroid nodules with left dominant solid hypoechoic nodule measuring 1.5 cm, status post FNA in December 2022 with FLUS and Afirma benign.  Repeat ultrasound thyroid in February 2024 : Bilateral stable thyroid nodule mostly measuring in the range of 1 to 1.5 cm, dominant left superior thyroid nodule which was previously biopsied was decreased in size.  - No compressive symptoms, euthyroid, no family h/o thyroid cancer, no h/o radiation exposure.  Plan: -These nodules need monitoring with serial  ultrasound. -Will check ultrasound thyroid to monitor thyroid nodules in 1 year prior to follow-up visit. -Check thyroid function test prior to follow-up visit in 1 year.  Diagnoses and all orders for this visit:  Multiple thyroid nodules -     T4, free -     TSH -     US THYROID; Future  Multinodular goiter    DISPOSITION Follow up in clinic in 12 months suggested.  All questions answered and patient verbalized understanding of the plan.  Antonio Logun Colavito, MD [facility] Endocrinology [facility] [address] [place], [address] [place], [place] [address] Phone # [phone]  At least part  of this note was generated using voice recognition software. Inadvertent word errors may have occurred, which were not recognized during the proofreading process.

## 2023-03-13 NOTE — Patient Instructions (Signed)
 Complete thyroid lab and ultrasound thyroid in 1 year few weeks prior to follow-up visit with me.

## 2023-03-16 ENCOUNTER — Other Ambulatory Visit: Payer: Self-pay | Admitting: Primary Care

## 2023-03-16 DIAGNOSIS — I10 Essential (primary) hypertension: Secondary | ICD-10-CM

## 2023-03-24 ENCOUNTER — Encounter: Payer: 59 | Admitting: Primary Care

## 2023-03-31 ENCOUNTER — Encounter (INDEPENDENT_AMBULATORY_CARE_PROVIDER_SITE_OTHER): Payer: Self-pay | Admitting: Family Medicine

## 2023-03-31 ENCOUNTER — Telehealth (INDEPENDENT_AMBULATORY_CARE_PROVIDER_SITE_OTHER): Payer: 59 | Admitting: Family Medicine

## 2023-03-31 VITALS — Ht 73.0 in

## 2023-03-31 DIAGNOSIS — E669 Obesity, unspecified: Secondary | ICD-10-CM

## 2023-03-31 DIAGNOSIS — Z7984 Long term (current) use of oral hypoglycemic drugs: Secondary | ICD-10-CM

## 2023-03-31 DIAGNOSIS — Z6836 Body mass index (BMI) 36.0-36.9, adult: Secondary | ICD-10-CM | POA: Diagnosis not present

## 2023-03-31 DIAGNOSIS — E1165 Type 2 diabetes mellitus with hyperglycemia: Secondary | ICD-10-CM

## 2023-03-31 DIAGNOSIS — J069 Acute upper respiratory infection, unspecified: Secondary | ICD-10-CM

## 2023-03-31 DIAGNOSIS — E119 Type 2 diabetes mellitus without complications: Secondary | ICD-10-CM | POA: Diagnosis not present

## 2023-03-31 NOTE — Progress Notes (Signed)
 Office: [phone]  /  Fax: [phone]  WEIGHT SUMMARY AND BIOMETRICS  Anthropometric Measurements Height: 6\' 1"  (1.854 m) Weight at Last Visit: 279 lb Starting Weight: 313 lb   No data recorded Other Clinical Data Fasting: no Labs: no Today's Visit #: 22 Starting Date: 09/25/21 Comments: visual visit    Chief Complaint: OBESITY  Virtual Visit via Telephone Note  I connected with Antonio Horton on 03/31/23 at  4:00 PM EDT by telephone and verified that I am speaking with the correct person using two identifiers.  Location: Patient: home Provider: office   I discussed the limitations, risks, security and privacy concerns of performing an evaluation and management service by telephone and the availability of in person appointments. I also discussed with the patient that there may be a patient responsible charge related to this service. The patient expressed understanding and agreed to proceed.     History of Present Illness   The patient presents with symptoms of a cold that has progressed to chest congestion and coughing.  He has been experiencing symptoms of a cold for two weeks, which have progressed to chest congestion and coughing. He has been taking over-the-counter cold medicine, which he feels has been helpful. No fever has been noted during this time.  He notes a decrease in appetite and mentions feeling like he has lost a couple of pounds, although not in an ideal way. Despite this, he is trying to maintain hydration.  He is stable on Rybelsus for type 2 diabetes and has been managing his diet and exercise well until he became sick. He continues to use resistance bands for upper body exercise, even when not feeling well.  His blood pressure is stable at 126/77, and he notes that it has not increased while taking cold medications. He is attempting to increase his protein intake but has not yet returned to his category three plan. He plans to resume  exercise slowly as he recovers, although he still feels very tired.          PHYSICAL EXAM:  Height 6\' 1"  (1.854 m). Body mass index is 36.81 kg/m.  DIAGNOSTIC DATA REVIEWED:  BMET    Component Value Date/Time   NA 142 01/06/2023 0847   K 4.5 01/06/2023 0847   CL 104 01/06/2023 0847   CO2 24 01/06/2023 0847   GLUCOSE 122 (H) 01/06/2023 0847   GLUCOSE 138 (H) 07/09/2022 1318   BUN 14 01/06/2023 0847   CREATININE 1.05 01/06/2023 0847   CALCIUM 9.4 01/06/2023 0847   GFRNONAA >60 07/09/2022 1318   GFRAA 95 12/08/2007 0958   Lab Results  Component Value Date   HGBA1C 6.3 (H) 01/06/2023   HGBA1C 6.2 (H) 12/15/2007   Lab Results  Component Value Date   INSULIN 39.7 (H) 01/06/2023   INSULIN 41.9 (H) 09/25/2021   Lab Results  Component Value Date   TSH 1.350 09/25/2021   CBC    Component Value Date/Time   WBC 9.9 07/09/2022 1318   RBC 5.43 07/09/2022 1318   HGB 16.2 07/09/2022 1318   HGB 15.9 09/25/2021 1010   HCT 47.4 07/09/2022 1318   HCT 46.5 09/25/2021 1010   PLT 269 07/09/2022 1318   MCV 87.3 07/09/2022 1318   MCV 91 09/25/2021 1010   MCH 29.8 07/09/2022 1318   MCHC 34.2 07/09/2022 1318   RDW 13.2 07/09/2022 1318   RDW 13.1 09/25/2021 1010   Iron Studies    Component Value Date/Time   IRON 94  04/12/2021 1115   FERRITIN 278.7 04/12/2021 1115   Lipid Panel     Component Value Date/Time   CHOL 108 01/06/2023 0847   TRIG 106 01/06/2023 0847   HDL 37 (L) 01/06/2023 0847   CHOLHDL 3 03/13/2021 0906   VLDL 25.8 03/13/2021 0906   LDLCALC 51 01/06/2023 0847   LDLCALC 117 (H) 03/25/2019 1541   Hepatic Function Panel     Component Value Date/Time   PROT 6.6 01/06/2023 0847   ALBUMIN 4.2 01/06/2023 0847   AST 18 01/06/2023 0847   ALT 19 01/06/2023 0847   ALKPHOS 52 01/06/2023 0847   BILITOT 0.6 01/06/2023 0847   BILIDIR 0.2 03/25/2021 0903      Component Value Date/Time   TSH 1.350 09/25/2021 1010   Nutritional Lab Results  Component Value  Date   VD25OH 38.4 01/06/2023   VD25OH 41.5 02/27/2022   VD25OH 19.9 (L) 09/25/2021     Assessment and Plan    Acute upper respiratory infection Experiencing congestion and coughing for two weeks, consistent with an acute upper respiratory infection. No fever reported, decreased appetite, maintaining hydration, and some weight loss attributed to the illness. - Continue over-the-counter cold medication as needed - Encourage hydration and rest - Advise gradual resumption of exercise as tolerated  Type 2 diabetes mellitus Well-managed on Rybelsus with diet and exercise. Routine disrupted due to current illness. Blood pressure stable at 126/77, no increased blood pressure on cold medications. - Continue Rybelsus as prescribed - Monitor blood glucose levels - Encourage return to regular diet and exercise regimen as health improves  Obesity Current weight is 274.8 pounds, BMI approximately 36. Lost about 35 pounds since starting weight loss journey, down one pound from last month. Following category three obesity management plan, plans to resume exercise routine slowly post-illness. - Continue category three obesity management plan - Monitor weight and BMI - Reassess weight and progress in one month      He was informed of the importance of frequent follow up visits to maximize his success with intensive lifestyle modifications for his multiple health conditions.    Quillian Quince, MD

## 2023-04-09 ENCOUNTER — Ambulatory Visit: Admitting: Primary Care

## 2023-04-09 ENCOUNTER — Encounter: Payer: Self-pay | Admitting: Primary Care

## 2023-04-09 VITALS — BP 118/62 | HR 88 | Temp 97.7°F | Ht 73.0 in | Wt 277.0 lb

## 2023-04-09 DIAGNOSIS — I10 Essential (primary) hypertension: Secondary | ICD-10-CM | POA: Diagnosis not present

## 2023-04-09 DIAGNOSIS — Z125 Encounter for screening for malignant neoplasm of prostate: Secondary | ICD-10-CM

## 2023-04-09 DIAGNOSIS — E7800 Pure hypercholesterolemia, unspecified: Secondary | ICD-10-CM

## 2023-04-09 DIAGNOSIS — I7121 Aneurysm of the ascending aorta, without rupture: Secondary | ICD-10-CM

## 2023-04-09 DIAGNOSIS — K760 Fatty (change of) liver, not elsewhere classified: Secondary | ICD-10-CM

## 2023-04-09 DIAGNOSIS — E1165 Type 2 diabetes mellitus with hyperglycemia: Secondary | ICD-10-CM

## 2023-04-09 DIAGNOSIS — E042 Nontoxic multinodular goiter: Secondary | ICD-10-CM

## 2023-04-09 DIAGNOSIS — G4733 Obstructive sleep apnea (adult) (pediatric): Secondary | ICD-10-CM | POA: Diagnosis not present

## 2023-04-09 DIAGNOSIS — Z0000 Encounter for general adult medical examination without abnormal findings: Secondary | ICD-10-CM

## 2023-04-09 DIAGNOSIS — Z7984 Long term (current) use of oral hypoglycemic drugs: Secondary | ICD-10-CM

## 2023-04-09 LAB — MICROALBUMIN / CREATININE URINE RATIO
Creatinine,U: 502.2 mg/dL
Microalb Creat Ratio: 4.5 mg/g (ref 0.0–30.0)
Microalb, Ur: 2.2 mg/dL — ABNORMAL HIGH (ref 0.0–1.9)

## 2023-04-09 NOTE — Assessment & Plan Note (Signed)
 Controlled.  Reviewed liver enzymes from December 2024.

## 2023-04-09 NOTE — Assessment & Plan Note (Signed)
 Following with endocrinology. Reviewed thyroid US from February 2024.

## 2023-04-09 NOTE — Assessment & Plan Note (Signed)
 Controlled.  Continue lisinopril-hydrochlorothiazide 10-12.5 mg daily.  CMP reviewed from December 2024.

## 2023-04-09 NOTE — Assessment & Plan Note (Signed)
 Following with cardiothoracic surgery.  Reviewed CTA chest from January 2025 which is stable.

## 2023-04-09 NOTE — Assessment & Plan Note (Signed)
 Controlled.  Continue rosuvastatin 5 mg daily.

## 2023-04-09 NOTE — Patient Instructions (Signed)
 Stop by the lab prior to leaving today. I will notify you of your results once received.   Please schedule a follow up visit for 6 months for a diabetes check.  It was a pleasure to see you today!

## 2023-04-09 NOTE — Assessment & Plan Note (Addendum)
 Continue CPAP nightly. Referral placed to pulmonology for updated CPAP machine and new sleep study given weight loss.

## 2023-04-09 NOTE — Assessment & Plan Note (Signed)
 Improving. Reviewed A1C from December 2024.  Continue Forman ER 1000 mg daily, Rybelsus 14 mg daily. Urine microalbumin pending.  Follow-up in 6 months.

## 2023-04-09 NOTE — Progress Notes (Signed)
 Subjective:    Patient ID: Antonio Horton, male    DOB: 12/29/1965, 58 y.o.   MRN: [id number]  HPI  Antonio Horton is a very pleasant 58 y.o. male who presents today for complete physical and follow up of chronic conditions.  Immunizations: -Tetanus: Completed in 2021 -Shingles: Completed Shingrix series -Pneumonia: Completed in 2021  Diet: Fair diet.  Exercise: No regular exercise.  Eye exam: Completes annually  Dental exam: Completed years gao   Colonoscopy: Completed in 2021, due 2028  PSA: Due   BP Readings from Last 3 Encounters:  04/09/23 118/62  03/03/23 110/66  02/10/23 122/79    Wt Readings from Last 3 Encounters:  04/09/23 277 lb (125.6 kg)  03/03/23 279 lb (126.6 kg)  02/10/23 290 lb (131.5 kg)       Review of Systems  Constitutional:  Negative for unexpected weight change.  HENT:  Negative for rhinorrhea.   Respiratory:  Negative for cough and shortness of breath.   Cardiovascular:  Negative for chest pain.  Gastrointestinal:  Negative for constipation and diarrhea.  Genitourinary:  Negative for difficulty urinating.  Musculoskeletal:  Positive for arthralgias and back pain.  Skin:  Negative for rash.  Allergic/Immunologic: Negative for environmental allergies.  Neurological:  Negative for dizziness and headaches.  Psychiatric/Behavioral:  The patient is not nervous/anxious.          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   Borderline diabetes    Cholelithiases 11/25/2020   COVID-19 virus infection 06/14/2020   Diabetes mellitus without complication (HCC)    Hepatic steatosis 12/05/2020   Hyperlipidemia    Hyperlipidemia associated with type 2 diabetes mellitus (HCC) 09/20/2021   Hypertension    Hypoxemia 11/25/2020   Other fatigue 09/25/2021   S/P laparoscopic cholecystectomy 11/27/2020   Sepsis (HCC) 11/25/2020   Sleep apnea    wears CPAP   SOBOE (shortness of breath  on exertion) 09/25/2021   Syncope 11/25/2020   Vasovagal reaction     Social History   Socioeconomic History   Marital status: Married    Spouse name: Not on file   Number of children: Not on file   Years of education: Not on file   Highest education level: Not on file  Occupational History   Not on file  Tobacco Use   Smoking status: Never   Smokeless tobacco: Never  Vaping Use   Vaping status: Never Used  Substance and Sexual Activity   Alcohol use: Not Currently    Alcohol/week: 0.0 standard drinks of alcohol    Comment: socially   Drug use: No   Sexual activity: Not on file  Other Topics Concern   Not on file  Social History Narrative   Married.   2 children.   Works as a Games developer.   Enjoys reading.    Social Drivers of Corporate investment banker Strain: Not on file  Food Insecurity: Not on file  Transportation Needs: Not on file  Physical Activity: Not on file  Stress: Not on file  Social Connections: Not on file  Intimate Partner Violence: Not on file    Past Surgical History:  Procedure Laterality Date   CHOLECYSTECTOMY N/A 11/27/2020   Procedure: LAPAROSCOPIC CHOLECYSTECTOMY;  Surgeon: Violeta Gelinas, MD;  Location: [facility] OR;  Service: General;  Laterality: N/A;   EYE SURGERY     Corneal dystrophies    Family History  Adopted: Yes  Problem Relation Age of Onset  Other Other        adopted   Thyroid disease Neg Hx     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    Current Outpatient Medications on File Prior to Visit  Medication Sig Dispense Refill   lisinopril-hydrochlorothiazide (ZESTORETIC) 10-12.5 MG tablet TAKE 1 TABLET BY MOUTH EVERY DAY FOR BLOOD PRESSURE 90 tablet 0   metFORMIN (GLUCOPHAGE-XR) 500 MG 24 hr tablet TAKE 2 TABLETS (1,000 MG TOTAL) BY MOUTH DAILY WITH BREAKFAST. FOR DIABETES. 180 tablet 1   prednisoLONE acetate (PRED FORTE) 1 % ophthalmic suspension Place 1 drop into the left eye daily.       rosuvastatin (CRESTOR) 5 MG tablet TAKE 1 TABLET (5 MG TOTAL) BY MOUTH DAILY FOR CHOLESTEROL 90 tablet 0   Semaglutide (RYBELSUS) 14 MG TABS Take 1 tablet (14 mg total) by mouth daily. 30 tablet 0   Vitamin D, Ergocalciferol, (DRISDOL) 1.25 MG (50000 UNIT) CAPS capsule Take 1 capsule (50,000 Units total) by mouth every 7 (seven) days. 4 capsule 0   No current facility-administered medications on file prior to visit.    BP 118/62   Pulse 88   Temp 97.7 F (36.5 C) (Temporal)   Ht 6\' 1"  (1.854 m)   Wt 277 lb (125.6 kg)   SpO2 97%   BMI 36.55 kg/m  Objective:   Physical Exam HENT:     Right Ear: Tympanic membrane and ear canal normal.     Left Ear: Tympanic membrane and ear canal normal.  Eyes:     Pupils: Pupils are equal, round, and reactive to light.  Cardiovascular:     Rate and Rhythm: Normal rate and regular rhythm.  Pulmonary:     Effort: Pulmonary effort is normal.     Breath sounds: Normal breath sounds.  Abdominal:     General: Bowel sounds are normal.     Palpations: Abdomen is soft.     Tenderness: There is no abdominal tenderness.  Musculoskeletal:        General: Normal range of motion.     Cervical back: Neck supple.  Skin:    General: Skin is warm and dry.  Neurological:     Mental Status: He is alert and oriented to person, place, and time.     Cranial Nerves: No cranial nerve deficit.     Deep Tendon Reflexes:     Reflex Scores:      Patellar reflexes are 2+ on the right side and 2+ on the left side. Psychiatric:        Mood and Affect: Mood normal.           Assessment & Plan:  Preventative health care Assessment & Plan: Immunizations UTD. Colonoscopy UTD, due 2028 PSA due and pending. He will have drawn at healthy weight and wellness in a few weeks.  Discussed the importance of a healthy diet and regular exercise in order for weight loss, and to reduce the risk of further co-morbidity.  Exam stable. Labs reviewed  Follow up in 1  year for repeat physical.    OSA on CPAP Assessment & Plan: Continue CPAP nightly. Referral placed to pulmonology for updated CPAP machine and new sleep study given weight loss.  Orders: -     Ambulatory referral to Pulmonology  Aneurysm of ascending aorta without rupture [facility]) Assessment & Plan: Following with cardiothoracic surgery.  Reviewed CTA chest from January 2025 which is stable.    Essential  hypertension Assessment & Plan: Controlled.  Continue lisinopril-hydrochlorothiazide 10-12.5 mg daily.  CMP reviewed from December 2024.   Non-alcoholic fatty liver disease Assessment & Plan: Controlled.  Reviewed liver enzymes from December 2024.    Type 2 diabetes mellitus with hyperglycemia, without long-term current use of insulin (HCC) Assessment & Plan: Improving. Reviewed A1C from December 2024.  Continue Forman ER 1000 mg daily, Rybelsus 14 mg daily. Urine microalbumin pending.  Follow-up in 6 months.  Orders: -     Microalbumin / creatinine urine ratio  Multinodular goiter Assessment & Plan: Following with endocrinology. Reviewed thyroid US from February 2024.   Screening for prostate cancer -     PSA; Future  Pure hypercholesterolemia Assessment & Plan: Controlled.  Continue rosuvastatin 5 mg daily.         Doreene Nest, NP

## 2023-04-09 NOTE — Assessment & Plan Note (Signed)
 Immunizations UTD. Colonoscopy UTD, due 2028 PSA due and pending. He will have drawn at healthy weight and wellness in a few weeks.  Discussed the importance of a healthy diet and regular exercise in order for weight loss, and to reduce the risk of further co-morbidity.  Exam stable. Labs reviewed  Follow up in 1 year for repeat physical.

## 2023-04-19 ENCOUNTER — Other Ambulatory Visit (INDEPENDENT_AMBULATORY_CARE_PROVIDER_SITE_OTHER): Payer: Self-pay | Admitting: Family Medicine

## 2023-04-19 DIAGNOSIS — E1165 Type 2 diabetes mellitus with hyperglycemia: Secondary | ICD-10-CM

## 2023-04-28 ENCOUNTER — Ambulatory Visit (INDEPENDENT_AMBULATORY_CARE_PROVIDER_SITE_OTHER): Payer: 59 | Admitting: Family Medicine

## 2023-04-28 ENCOUNTER — Encounter (INDEPENDENT_AMBULATORY_CARE_PROVIDER_SITE_OTHER): Payer: Self-pay | Admitting: Family Medicine

## 2023-04-28 VITALS — BP 122/73 | HR 100 | Temp 98.1°F | Ht 73.0 in | Wt 274.0 lb

## 2023-04-28 DIAGNOSIS — R5383 Other fatigue: Secondary | ICD-10-CM

## 2023-04-28 DIAGNOSIS — E119 Type 2 diabetes mellitus without complications: Secondary | ICD-10-CM | POA: Diagnosis not present

## 2023-04-28 DIAGNOSIS — U099 Post covid-19 condition, unspecified: Secondary | ICD-10-CM

## 2023-04-28 DIAGNOSIS — I10 Essential (primary) hypertension: Secondary | ICD-10-CM

## 2023-04-28 DIAGNOSIS — Z7984 Long term (current) use of oral hypoglycemic drugs: Secondary | ICD-10-CM

## 2023-04-28 DIAGNOSIS — E559 Vitamin D deficiency, unspecified: Secondary | ICD-10-CM | POA: Diagnosis not present

## 2023-04-28 DIAGNOSIS — Z6836 Body mass index (BMI) 36.0-36.9, adult: Secondary | ICD-10-CM

## 2023-04-28 DIAGNOSIS — E1165 Type 2 diabetes mellitus with hyperglycemia: Secondary | ICD-10-CM

## 2023-04-28 DIAGNOSIS — E669 Obesity, unspecified: Secondary | ICD-10-CM

## 2023-04-28 MED ORDER — VITAMIN D (ERGOCALCIFEROL) 1.25 MG (50000 UNIT) PO CAPS: 50000.0000 [IU] | ORAL_CAPSULE | ORAL | 0 refills | Status: DC

## 2023-04-28 MED ORDER — RYBELSUS 14 MG PO TABS: 14.0000 mg | ORAL_TABLET | Freq: Every day | ORAL | 0 refills | Status: DC

## 2023-04-28 NOTE — Progress Notes (Signed)
 Office: [phone]  /  Fax: [phone]  WEIGHT SUMMARY AND BIOMETRICS  Anthropometric Measurements Height: 6\' 1"  (1.854 m) Weight: 274 lb (124.3 kg) BMI (Calculated): 36.16 Weight at Last Visit: 279 lb Weight Lost Since Last Visit: 5 lb Weight Gained Since Last Visit: 0 Starting Weight: 313 lb Total Weight Loss (lbs): 39 lb (17.7 kg)   Body Composition  Body Fat %: 36 % Fat Mass (lbs): 98.8 lbs Muscle Mass (lbs): 167.2 lbs Total Body Water (lbs): 121.6 lbs Visceral Fat Rating : 21   Other Clinical Data Fasting: no Labs: no Today's Visit #: 22 Starting Date: 09/25/21    Chief Complaint: OBESITY   History of Present Illness Antonio Horton is a 58 year old male with obesity and type two diabetes who presents for obesity management and progress assessment.  He follows his category three eating plan about fifty percent of the time and has not been exercising. Despite this, he has lost five pounds in the last month since his last visit.  He has type two diabetes and is currently treated with metformin and Rybelsus. He experiences gastrointestinal issues with Rybelsus when consuming foods he should avoid, such as large donuts, which he describes as 'yeasty, sweet.'  He has been sick for two months, including a bout with COVID-19. He describes feeling 'in a fog' and 'exhausted all the time,' with symptoms of vertigo and fatigue persisting. 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 His family, including his wife, children, and grandson, also contracted COVID-19, with varying symptoms among them. He has been trying to maintain hydration and has been taking a tumbler to work to sip water throughout the day.  He discusses his current eating habits, noting that he takes a breakfast snack and a sandwich to work daily, and has been considering incorporating microwave meals for convenience. He  acknowledges that he might not be eating enough, which could be contributing to his fatigue.  He is on prescription vitamin D for vitamin D deficiency and requests a refill, stating he is stable on this medication.  His blood pressure is well controlled at 122/73, and he is taking lisinopril and hydrochlorothiazide.      PHYSICAL EXAM:  Blood pressure 122/73, pulse 100, temperature 98.1 F (36.7 C), height 6\' 1"  (1.854 m), weight 274 lb (124.3 kg), SpO2 95%. Body mass index is 36.15 kg/m.  DIAGNOSTIC DATA REVIEWED:  BMET    Component Value Date/Time   NA 142 01/06/2023 0847   K 4.5 01/06/2023 0847   CL 104 01/06/2023 0847   CO2 24 01/06/2023 0847   GLUCOSE 122 (H) 01/06/2023 0847   GLUCOSE 138 (H) 07/09/2022 1318   BUN 14 01/06/2023 0847   CREATININE 1.05 01/06/2023 0847   CALCIUM 9.4 01/06/2023 0847   GFRNONAA >60 07/09/2022 1318   GFRAA 95 12/08/2007 0958   Lab Results  Component Value Date   HGBA1C 6.3 (H) 01/06/2023   HGBA1C 6.2 (H) 12/15/2007   Lab Results  Component Value Date   INSULIN 39.7 (H) 01/06/2023   INSULIN 41.9 (H) 09/25/2021   Lab Results  Component Value Date   TSH 1.350 09/25/2021   CBC    Component Value Date/Time   WBC 9.9 07/09/2022 1318   RBC 5.43 07/09/2022 1318   HGB 16.2 07/09/2022 1318   HGB 15.9 09/25/2021 1010   HCT 47.4 07/09/2022 1318   HCT 46.5 09/25/2021  1010   PLT 269 07/09/2022 1318   MCV 87.3 07/09/2022 1318   MCV 91 09/25/2021 1010   MCH 29.8 07/09/2022 1318   MCHC 34.2 07/09/2022 1318   RDW 13.2 07/09/2022 1318   RDW 13.1 09/25/2021 1010   Iron Studies    Component Value Date/Time   IRON 94 04/12/2021 1115   FERRITIN 278.7 04/12/2021 1115   Lipid Panel     Component Value Date/Time   CHOL 108 01/06/2023 0847   TRIG 106 01/06/2023 0847   HDL 37 (L) 01/06/2023 0847   CHOLHDL 3 03/13/2021 0906   VLDL 25.8 03/13/2021 0906   LDLCALC 51 01/06/2023 0847   LDLCALC 117 (H) 03/25/2019 1541   Hepatic Function  Panel     Component Value Date/Time   PROT 6.6 01/06/2023 0847   ALBUMIN 4.2 01/06/2023 0847   AST 18 01/06/2023 0847   ALT 19 01/06/2023 0847   ALKPHOS 52 01/06/2023 0847   BILITOT 0.6 01/06/2023 0847   BILIDIR 0.2 03/25/2021 0903      Component Value Date/Time   TSH 1.350 09/25/2021 1010   Nutritional Lab Results  Component Value Date   VD25OH 38.4 01/06/2023   VD25OH 41.5 02/27/2022   VD25OH 19.9 (L) 09/25/2021     Assessment and Plan Assessment & Plan Type 2 Diabetes Mellitus Currently managed with metformin and Rybelsus. Reports gastrointestinal issues with Rybelsus when consuming certain foods, such as donuts. Medication is otherwise effective. Acknowledges need for dietary modifications to minimize side effects. - Refill Rybelsus prescription - Advise on dietary modifications to minimize gastrointestinal side effects  Obesity Following category three eating plan about 50% of the time. Lost five pounds in the last month, likely due to recent illness rather than sustainable lifestyle changes. Not currently exercising. Encouraged to increase protein intake to combat fatigue and support weight loss. Suggested use of protein shakes or Fairlife milk for convenience, especially when appetite is low. - Encourage adherence to the eating plan - Recommend increasing protein intake, possibly through protein shakes or Fairlife milk - Discuss the potential use of slow cooker meals for convenience - Encourage continued weight monitoring  Hypertension Blood pressure well-controlled at 122/73 mmHg on lisinopril and hydrochlorothiazide. -Continue to work on diet nad exercise to improve BP  Vitamin D Deficiency Requests refill of prescription vitamin D. No new issues reported. - Refill prescription vitamin D  Fatigue secondary to COVID-19 recovery Reports persistent fatigue, brain fog, and exhaustion following recent COVID-19 infection. Advised to prioritize rest and hydration.  Encouraged to monitor symptoms and maintain communication with primary care provider. Fatigue likely exacerbated by recent illness and changes in routine. - Encourage adequate rest and hydration - Monitor symptoms and report any changes to primary care provider  Follow-up - Schedule follow-up appointment in four weeks - Advise to send MyChart message if any issues arise    He was informed of the importance of frequent follow up visits to maximize his success with intensive lifestyle modifications for his multiple health conditions.    Quillian Quince, MD

## 2023-05-04 ENCOUNTER — Telehealth: Payer: Self-pay | Admitting: Primary Care

## 2023-05-04 NOTE — Telephone Encounter (Signed)
 Copied from CRM [id number]. Topic: Referral - Status >> May 04, 2023 10:28 AM Roseanne Cones wrote: Reason for CRM: Patient is scheduled to see Roena Clark NP for a [facility] [place] consult in May with  Pulmonary at [facility] noted that Referral for Pulmonary is closed - Can we reopen it for the patient? I advised the patient that we would be reaching out on his behalf.

## 2023-05-05 NOTE — Telephone Encounter (Signed)
 The Pulmonary Referral was closed on their end due to them not being able to reach the patient to schedule.   I have reopened this referral and routed it back to them.   They will contact the patient to schedule.

## 2023-05-19 ENCOUNTER — Other Ambulatory Visit (INDEPENDENT_AMBULATORY_CARE_PROVIDER_SITE_OTHER): Payer: Self-pay | Admitting: Family Medicine

## 2023-05-19 DIAGNOSIS — E559 Vitamin D deficiency, unspecified: Secondary | ICD-10-CM

## 2023-05-21 ENCOUNTER — Other Ambulatory Visit: Payer: Self-pay | Admitting: Primary Care

## 2023-05-21 DIAGNOSIS — E7800 Pure hypercholesterolemia, unspecified: Secondary | ICD-10-CM

## 2023-05-21 DIAGNOSIS — E119 Type 2 diabetes mellitus without complications: Secondary | ICD-10-CM

## 2023-05-22 ENCOUNTER — Other Ambulatory Visit (INDEPENDENT_AMBULATORY_CARE_PROVIDER_SITE_OTHER): Payer: Self-pay | Admitting: Family Medicine

## 2023-05-22 DIAGNOSIS — E1165 Type 2 diabetes mellitus with hyperglycemia: Secondary | ICD-10-CM

## 2023-05-25 ENCOUNTER — Telehealth (INDEPENDENT_AMBULATORY_CARE_PROVIDER_SITE_OTHER): Payer: Self-pay

## 2023-05-25 ENCOUNTER — Other Ambulatory Visit (INDEPENDENT_AMBULATORY_CARE_PROVIDER_SITE_OTHER): Payer: Self-pay

## 2023-05-25 ENCOUNTER — Encounter (INDEPENDENT_AMBULATORY_CARE_PROVIDER_SITE_OTHER): Payer: Self-pay | Admitting: Family Medicine

## 2023-05-25 DIAGNOSIS — E1165 Type 2 diabetes mellitus with hyperglycemia: Secondary | ICD-10-CM

## 2023-05-25 MED ORDER — RYBELSUS 14 MG PO TABS: 14.0000 mg | ORAL_TABLET | Freq: Every day | ORAL | 0 refills | Status: DC

## 2023-05-25 MED ORDER — RYBELSUS 14 MG PO TABS: 14.0000 mg | ORAL_TABLET | Freq: Every day | ORAL | 1 refills | Status: DC

## 2023-05-25 NOTE — Telephone Encounter (Signed)
 LAST APPOINTMENT DATE: 04/28/23 NEXT APPOINTMENT DATE: 06/04/23   [id number] - [place], [place] - [address] [place] [address] Phone: [phone] Fax: [phone]  Patient is requesting a refill of the following medications: Requested Prescriptions    No prescriptions requested or ordered in this encounter    Date last filled: 04/28/23 Previously prescribed by [facility]  Lab Results  Component Value Date   HGBA1C 6.3 (H) 01/06/2023   HGBA1C 6.5 (A) 09/23/2022   HGBA1C 6.1 (H) 02/27/2022   Lab Results  Component Value Date   MICROALBUR 2.2 (H) 04/09/2023   LDLCALC 51 01/06/2023   CREATININE 1.05 01/06/2023   Lab Results  Component Value Date   VD25OH 38.4 01/06/2023   VD25OH 41.5 02/27/2022   VD25OH 19.9 (L) 09/25/2021    BP Readings from Last 3 Encounters:  04/28/23 122/73  04/09/23 118/62  03/03/23 110/66

## 2023-05-25 NOTE — Telephone Encounter (Signed)
 Please refill x 1

## 2023-05-26 ENCOUNTER — Other Ambulatory Visit: Payer: Self-pay | Admitting: Primary Care

## 2023-05-26 DIAGNOSIS — I10 Essential (primary) hypertension: Secondary | ICD-10-CM

## 2023-06-04 ENCOUNTER — Ambulatory Visit (INDEPENDENT_AMBULATORY_CARE_PROVIDER_SITE_OTHER): Admitting: Family Medicine

## 2023-06-04 ENCOUNTER — Encounter (INDEPENDENT_AMBULATORY_CARE_PROVIDER_SITE_OTHER): Payer: Self-pay | Admitting: Family Medicine

## 2023-06-04 VITALS — BP 105/71 | HR 86 | Temp 97.9°F | Ht 73.0 in | Wt 274.0 lb

## 2023-06-04 DIAGNOSIS — E559 Vitamin D deficiency, unspecified: Secondary | ICD-10-CM

## 2023-06-04 DIAGNOSIS — Z7984 Long term (current) use of oral hypoglycemic drugs: Secondary | ICD-10-CM

## 2023-06-04 DIAGNOSIS — E669 Obesity, unspecified: Secondary | ICD-10-CM | POA: Diagnosis not present

## 2023-06-04 DIAGNOSIS — E119 Type 2 diabetes mellitus without complications: Secondary | ICD-10-CM

## 2023-06-04 DIAGNOSIS — E1169 Type 2 diabetes mellitus with other specified complication: Secondary | ICD-10-CM

## 2023-06-04 DIAGNOSIS — Z6836 Body mass index (BMI) 36.0-36.9, adult: Secondary | ICD-10-CM | POA: Diagnosis not present

## 2023-06-04 DIAGNOSIS — E1165 Type 2 diabetes mellitus with hyperglycemia: Secondary | ICD-10-CM

## 2023-06-04 MED ORDER — VITAMIN D (ERGOCALCIFEROL) 1.25 MG (50000 UNIT) PO CAPS: 50000.0000 [IU] | ORAL_CAPSULE | ORAL | 0 refills | Status: DC

## 2023-06-04 MED ORDER — RYBELSUS 14 MG PO TABS: 14.0000 mg | ORAL_TABLET | Freq: Every day | ORAL | 1 refills | Status: DC

## 2023-06-04 NOTE — Progress Notes (Signed)
 Office: [phone]  /  Fax: [phone]  WEIGHT SUMMARY AND BIOMETRICS  Anthropometric Measurements Height: 6\' 1"  (1.854 m) Weight: 274 lb (124.3 kg) BMI (Calculated): 36.16 Weight at Last Visit: 274 lb Weight Lost Since Last Visit: 0 Weight Gained Since Last Visit: 0 Starting Weight: 313 lb Total Weight Loss (lbs): 39 lb (17.7 kg) Peak Weight: 390 lb   Body Composition  Body Fat %: 36.1 % Fat Mass (lbs): 99 lbs Muscle Mass (lbs): 166.8 lbs Total Body Water (lbs): 128 lbs Visceral Fat Rating : 21   Other Clinical Data Fasting: no Labs: no Today's Visit #: 23 Starting Date: 09/25/21    Chief Complaint: OBESITY    History of Present Illness Antonio Horton is a 58 year old male with obesity, type 2 diabetes, and hypertension who presents for obesity treatment plan assessment and weight loss progress.  He follows the category three eating plan 75% of the time and has maintained his weight over the last five weeks, losing a total of 39 pounds since starting the program. He exercises using resistance bands for 15 to 20 minutes, four times per week.  He has a history of type 2 diabetes and is currently taking metformin  XR, 500 mg, two pills a day, and Rybelsus  14 mg daily. His most recent hemoglobin A1c was well controlled at 6.3, approximately five months ago. He experiences nausea and a lack of appetite over the last two weeks, with increased frequency of bowel movements over the past four days, but no vomiting. He describes a low-grade stomach pain and increased gas and burping.  He has a history of hypertension, managed with lisinopril  hydrochlorothiazide  10/12.5 mg. He reports adequate hydration but notes his urine was dark today, possibly due to decreased intake over the last few days when he was not feeling well.  He is preparing for a wedding in four weeks and has been eating out more than usual due to the preparations.      PHYSICAL EXAM:  Blood  pressure 105/71, pulse 86, temperature 97.9 F (36.6 C), height 6\' 1"  (1.854 m), weight 274 lb (124.3 kg), SpO2 97%. Body mass index is 36.15 kg/m.  DIAGNOSTIC DATA REVIEWED:  BMET    Component Value Date/Time   NA 142 01/06/2023 0847   K 4.5 01/06/2023 0847   CL 104 01/06/2023 0847   CO2 24 01/06/2023 0847   GLUCOSE 122 (H) 01/06/2023 0847   GLUCOSE 138 (H) 07/09/2022 1318   BUN 14 01/06/2023 0847   CREATININE 1.05 01/06/2023 0847   CALCIUM  9.4 01/06/2023 0847   GFRNONAA >60 07/09/2022 1318   GFRAA 95 12/08/2007 0958   Lab Results  Component Value Date   HGBA1C 6.3 (H) 01/06/2023   HGBA1C 6.2 (H) 12/15/2007   Lab Results  Component Value Date   INSULIN  39.7 (H) 01/06/2023   INSULIN  41.9 (H) 09/25/2021   Lab Results  Component Value Date   TSH 1.350 09/25/2021   CBC    Component Value Date/Time   WBC 9.9 07/09/2022 1318   RBC 5.43 07/09/2022 1318   HGB 16.2 07/09/2022 1318   HGB 15.9 09/25/2021 1010   HCT 47.4 07/09/2022 1318   HCT 46.5 09/25/2021 1010   PLT 269 07/09/2022 1318   MCV 87.3 07/09/2022 1318   MCV 91 09/25/2021 1010   MCH 29.8 07/09/2022 1318   MCHC 34.2 07/09/2022 1318   RDW 13.2 07/09/2022 1318   RDW 13.1 09/25/2021 1010   Iron Studies    Component  Value Date/Time   IRON 94 04/12/2021 1115   FERRITIN 278.7 04/12/2021 1115   Lipid Panel     Component Value Date/Time   CHOL 108 01/06/2023 0847   TRIG 106 01/06/2023 0847   HDL 37 (L) 01/06/2023 0847   CHOLHDL 3 03/13/2021 0906   VLDL 25.8 03/13/2021 0906   LDLCALC 51 01/06/2023 0847   LDLCALC 117 (H) 03/25/2019 1541   Hepatic Function Panel     Component Value Date/Time   PROT 6.6 01/06/2023 0847   ALBUMIN 4.2 01/06/2023 0847   AST 18 01/06/2023 0847   ALT 19 01/06/2023 0847   ALKPHOS 52 01/06/2023 0847   BILITOT 0.6 01/06/2023 0847   BILIDIR 0.2 03/25/2021 0903      Component Value Date/Time   TSH 1.350 09/25/2021 1010   Nutritional Lab Results  Component Value Date    VD25OH 38.4 01/06/2023   VD25OH 41.5 02/27/2022   VD25OH 19.9 (L) 09/25/2021     Assessment and Plan Assessment & Plan Obesity He has maintained his weight over the last five weeks, losing a total of thirty-nine pounds. He adheres to the category three eating plan 75% of the time and exercises with resistance bands for 15-20 minutes four times per week. He reports nausea, low-grade abdominal pain, increased frequency of bowel movements, and gas, likely due to the high volume of the current eating plan. Adjustments to the diet are planned to reduce volume and improve gastrointestinal comfort. The goal is to consume between 1400 and 1700 calories per day, with a focus on maintaining muscle mass through adequate protein intake. The lowest caloric intake should be 1400 calories to prevent metabolic slowdown, with a target of 120 grams of protein per day to maintain muscle mass. - Provide recipes with lower volume but adequate nutritional content - Set caloric intake goal between 1400 and 1700 calories per day - Ensure protein intake of at least 120 grams per day, with allowance for protein supplements if necessary - Encourage tracking of caloric and protein intake using an app or on paper - Adjust eating plan to reduce volume and improve gastrointestinal comfort - Encourage hydration to address potential dehydration  Type 2 diabetes mellitus Type 2 diabetes is well controlled with a recent hemoglobin A1c of 6.3%. He is currently on metformin  XR 500 mg twice daily and Rybelsus  14 mg daily. - Refill Rybelsus  prescription as needed -Change eating plan to meet his nutritional needs but also keep his glucose under control and help with weight loss.  Hypertension Hypertension is well controlled with a current blood pressure of 105/71 mmHg. He is on lisinopril  hydrochlorothiazide  10/12.5 mg. -Continue medications -Continue diet/exercise -Watch for signs of hypotension    I have personally spent  50 minutes total time today in preparation, patient care, and documentation for this visit, including the following: review of clinical lab tests; review of medical history, review of nutritional needs and counseling on how to build his own eating plan meeting his macronutrient and calorie needs   He was informed of the importance of frequent follow up visits to maximize his success with intensive lifestyle modifications for his multiple health conditions.    Jasmine Mesi, MD

## 2023-06-16 ENCOUNTER — Encounter (HOSPITAL_BASED_OUTPATIENT_CLINIC_OR_DEPARTMENT_OTHER): Payer: Self-pay | Admitting: Adult Health

## 2023-06-16 ENCOUNTER — Ambulatory Visit (HOSPITAL_BASED_OUTPATIENT_CLINIC_OR_DEPARTMENT_OTHER): Admitting: Adult Health

## 2023-06-16 VITALS — BP 123/87 | HR 81 | Ht 73.0 in | Wt 285.2 lb

## 2023-06-16 DIAGNOSIS — Z9981 Dependence on supplemental oxygen: Secondary | ICD-10-CM | POA: Diagnosis not present

## 2023-06-16 DIAGNOSIS — G4733 Obstructive sleep apnea (adult) (pediatric): Secondary | ICD-10-CM

## 2023-06-16 DIAGNOSIS — Z6836 Body mass index (BMI) 36.0-36.9, adult: Secondary | ICD-10-CM

## 2023-06-16 NOTE — Patient Instructions (Signed)
 Continue on CPAP At bedtime   Work on healthy weight loss  Set up for home sleep study  Do not drive if sleepy  Follow up in 6 weeks to discuss results and treatment plan

## 2023-06-16 NOTE — Progress Notes (Signed)
 Epworth Sleepiness Scale  Use the following scale to choose the most appropriate number for each situation. 0 Would never nod off 1  Slight  chance of nodding off 2 Moderate chance of nodding off 3 High chance of nodding off  Sitting and reading: 3 Watching TV: 3 Sitting, inactive, in a public place (e.g., in a meeting, theater, or dinner event): 0 As a passenger in a car for an hour or more without stopping for a break: 0 Lying down to rest when circumstances permit:2 Sitting and talking to someone: 0 Sitting quietly after a meal without alcohol: 0 In a car, while stopped for a few  minutes in traffic or at a light: 0  TOTOAL: 8

## 2023-06-16 NOTE — Progress Notes (Signed)
 @Patient  ID: Antonio Horton, male    DOB: [birth year], 58 y.o.   MRN: [id number]  Chief Complaint  Patient presents with   Establish Care    OSA pt is currently using his wifes at her settings    Referring provider: Gabriel John, NP  HPI: 58 year old male seen for sleep consult Jun 16, 2023 to establish for sleep apnea.  Patient says he was diagnosed with sleep apnea greater than 20 years ago and has been on CPAP since then.  TEST/EVENTS :   06/16/2023 Sleep consult  Patient presents for sleep consultation.  Kindly referred by primary care provider, Cecille Coffin, NP.  Patient states he was diagnosed with sleep apnea greater than 20 years ago.  Initially hide significant daytime sleepiness restless sleep and snoring.  Wore CPAP for many years.  His CPAP machine broke and then he started using his wife's machine.  His current machine is almost 57 years old.  He also has recently lost a significant amount of weight through dietary changes.  We are unable to get a CPAP download.  I also do not have a copy of his original sleep study.  Patient has no history of congestive heart failure or stroke.  Rarely takes a nap.  Does not use any sleep aids.  Has no symptoms suspicious for cataplexy or sleep paralysis.  Giving this to bed about 10 PM.  Takes up to 30 minutes to go to sleep.  Gets up at 6:30 AM.  Gets up typically 2-3 times each night. Epworth score is 8.  He feels that he benefits from CPAP.  Says he cannot sleep without it if for some reason he falls asleep he snores loudly.  SH :Patient is married.  Patient is a never smoker. 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   Borderline diabetes    Cholelithiases 11/25/2020   COVID-19 virus infection 06/14/2020   Diabetes mellitus without complication (HCC)    Hepatic steatosis 12/05/2020   Hyperlipidemia    Hyperlipidemia associated with type 2 diabetes mellitus (HCC) 09/20/2021   Hypertension    Hypoxemia 11/25/2020   Other fatigue 09/25/2021   S/P laparoscopic cholecystectomy 11/27/2020   Sepsis (HCC) 11/25/2020   Sleep apnea    wears CPAP   SOBOE (shortness of breath on exertion) 09/25/2021   Syncope 11/25/2020   Vasovagal reaction     Tobacco History: Social History   Tobacco Use  Smoking Status Never  Smokeless Tobacco Never   Counseling given: Not Answered   Outpatient Medications Prior to Visit  Medication Sig Dispense Refill   lisinopril -hydrochlorothiazide  (ZESTORETIC ) 10-12.5 MG tablet TAKE 1 TABLET BY MOUTH EVERY DAY FOR BLOOD PRESSURE 90 tablet 2   metFORMIN  (GLUCOPHAGE -XR) 500 MG 24 hr tablet TAKE 2 TABLETS (1,000 MG TOTAL) BY MOUTH DAILY WITH BREAKFAST. FOR DIABETES. 180 tablet 1   prednisoLONE acetate (PRED FORTE) 1 % ophthalmic suspension Place 1 drop into the left eye daily.      rosuvastatin  (CRESTOR )  5 MG tablet TAKE 1 TABLET (5 MG TOTAL) BY MOUTH DAILY FOR CHOLESTEROL 90 tablet 2   Semaglutide  (RYBELSUS ) 14 MG TABS Take 1 tablet (14 mg total) by mouth daily. 30 tablet 1   Vitamin D , Ergocalciferol , (DRISDOL ) 1.25 MG (50000 UNIT) CAPS capsule Take 1 capsule (50,000 Units total) by mouth every 7 (seven) days. 4 capsule 0   No facility-administered medications prior to visit.     Review of Systems:   Constitutional:   No  weight loss, night sweats,  Fevers, chills, fatigue, or  lassitude.  HEENT:   No headaches,  Difficulty swallowing,  Tooth/dental problems,  or  Sore throat,                No sneezing, itching, ear ache, nasal congestion, post nasal drip,   CV:  No chest pain,  Orthopnea, PND, swelling in lower extremities, anasarca, dizziness, palpitations, syncope.   GI  No heartburn, indigestion, abdominal pain, nausea, vomiting, diarrhea, change in bowel habits, loss of appetite, bloody stools.   Resp: No shortness of breath with exertion or at rest.  No excess mucus, no productive cough,  No non-productive cough,  No coughing up of blood.  No change in color of mucus.  No wheezing.  No chest wall deformity  Skin: no rash or lesions.  GU: no dysuria, change in color of urine, no urgency or frequency.  No flank pain, no hematuria   MS:  No joint pain or swelling.  No decreased range of motion.  No back pain.    Physical Exam  BP 123/87   Pulse 81   Ht 6\' 1"  (1.854 m)   Wt 285 lb 3.2 oz (129.4 kg)   SpO2 99%   BMI 37.63 kg/m   GEN: A/Ox3; pleasant , NAD, well nourished    HEENT:  [place]/AT,  EACs-clear, TMs-wnl, NOSE-clear, THROAT-clear, no lesions, no postnasal drip or exudate noted.  Class III-IV MP airway  NECK:  Supple w/ fair ROM; no JVD; normal carotid impulses w/o bruits; no thyromegaly or nodules palpated; no lymphadenopathy.    RESP  Clear  P & A; w/o, wheezes/ rales/ or rhonchi. no accessory muscle use, no dullness to percussion  CARD:  RRR, no m/r/g, no peripheral edema, pulses intact, no cyanosis or clubbing.  GI:   Soft & nt; nml bowel sounds; no organomegaly or masses detected.   Musco: Warm bil, no deformities or joint swelling noted.   Neuro: alert, no focal deficits noted.    Skin: Warm, no lesions or rashes    Lab Results:  CBC  BMET  No results found for: "BNP"  ProBNP No results found for: "PROBNP"  Imaging: No results found.  Administration History     None           No data to display          No results found for: "NITRICOXIDE"      Assessment & Plan:   OSA on  CPAP Longstanding history of sleep apnea on CPAP.  Unable to find original sleep study.  Unable to get CPAP download.  Patient is also lost a significant amount of weight.  Recommend setting patient up for a diagnostic home sleep study.  Continue on CPAP for now.  Patient's machine is 57 years old.  Will need new CPAP machine once sleep study results are available we will proceed with new orders.   - discussed how weight can impact sleep and risk for sleep disordered  breathing - discussed options to assist with weight loss: combination of diet modification, cardiovascular and strength training exercises   - had an extensive discussion regarding the adverse health consequences related to untreated sleep disordered breathing - specifically discussed the risks for hypertension, coronary artery disease, cardiac dysrhythmias, cerebrovascular disease, and diabetes - lifestyle modification discussed   - discussed how sleep disruption can increase risk of accidents, particularly when driving - safe driving practices were discussed   Plan  Patient Instructions  Continue on CPAP At bedtime   Work on healthy weight loss  Set up for home sleep study  Do not drive if sleepy  Follow up in 6 weeks to discuss results and treatment plan      BMI 36.0-36.9,adult Continue with healthy weight loss.     Roena Clark, NP 06/16/2023

## 2023-06-16 NOTE — Assessment & Plan Note (Signed)
 Longstanding history of sleep apnea on CPAP.  Unable to find original sleep study.  Unable to get CPAP download.  Patient is also lost a significant amount of weight.  Recommend setting patient up for a diagnostic home sleep study.  Continue on CPAP for now.  Patient's machine is 57 years old.  Will need new CPAP machine once sleep study results are available we will proceed with new orders.   - discussed how weight can impact sleep and risk for sleep disordered breathing - discussed options to assist with weight loss: combination of diet modification, cardiovascular and strength training exercises   - had an extensive discussion regarding the adverse health consequences related to untreated sleep disordered breathing - specifically discussed the risks for hypertension, coronary artery disease, cardiac dysrhythmias, cerebrovascular disease, and diabetes - lifestyle modification discussed   - discussed how sleep disruption can increase risk of accidents, particularly when driving - safe driving practices were discussed   Plan  Patient Instructions  Continue on CPAP At bedtime   Work on healthy weight loss  Set up for home sleep study  Do not drive if sleepy  Follow up in 6 weeks to discuss results and treatment plan

## 2023-06-16 NOTE — Assessment & Plan Note (Signed)
Continue with healthy weight loss 

## 2023-06-24 ENCOUNTER — Encounter

## 2023-06-24 DIAGNOSIS — G4733 Obstructive sleep apnea (adult) (pediatric): Secondary | ICD-10-CM

## 2023-06-29 ENCOUNTER — Other Ambulatory Visit: Payer: Self-pay | Admitting: Thoracic Surgery (Cardiothoracic Vascular Surgery)

## 2023-06-29 DIAGNOSIS — I7121 Aneurysm of the ascending aorta, without rupture: Secondary | ICD-10-CM

## 2023-07-05 DIAGNOSIS — G4730 Sleep apnea, unspecified: Secondary | ICD-10-CM | POA: Diagnosis not present

## 2023-07-06 ENCOUNTER — Ambulatory Visit (INDEPENDENT_AMBULATORY_CARE_PROVIDER_SITE_OTHER): Admitting: Family Medicine

## 2023-07-06 ENCOUNTER — Encounter (INDEPENDENT_AMBULATORY_CARE_PROVIDER_SITE_OTHER): Payer: Self-pay | Admitting: Family Medicine

## 2023-07-06 VITALS — BP 108/72 | HR 77 | Temp 98.1°F | Ht 73.0 in | Wt 282.0 lb

## 2023-07-06 DIAGNOSIS — E559 Vitamin D deficiency, unspecified: Secondary | ICD-10-CM | POA: Diagnosis not present

## 2023-07-06 DIAGNOSIS — E669 Obesity, unspecified: Secondary | ICD-10-CM

## 2023-07-06 DIAGNOSIS — Z6836 Body mass index (BMI) 36.0-36.9, adult: Secondary | ICD-10-CM

## 2023-07-06 DIAGNOSIS — E119 Type 2 diabetes mellitus without complications: Secondary | ICD-10-CM | POA: Diagnosis not present

## 2023-07-06 DIAGNOSIS — Z6837 Body mass index (BMI) 37.0-37.9, adult: Secondary | ICD-10-CM | POA: Diagnosis not present

## 2023-07-06 DIAGNOSIS — Z6841 Body Mass Index (BMI) 40.0 and over, adult: Secondary | ICD-10-CM

## 2023-07-06 DIAGNOSIS — Z7985 Long-term (current) use of injectable non-insulin antidiabetic drugs: Secondary | ICD-10-CM

## 2023-07-06 DIAGNOSIS — Z7984 Long term (current) use of oral hypoglycemic drugs: Secondary | ICD-10-CM

## 2023-07-06 DIAGNOSIS — E1165 Type 2 diabetes mellitus with hyperglycemia: Secondary | ICD-10-CM

## 2023-07-06 MED ORDER — RYBELSUS 14 MG PO TABS: 14.0000 mg | ORAL_TABLET | Freq: Every day | ORAL | 1 refills | Status: DC

## 2023-07-06 MED ORDER — VITAMIN D (ERGOCALCIFEROL) 1.25 MG (50000 UNIT) PO CAPS: 50000.0000 [IU] | ORAL_CAPSULE | ORAL | 0 refills | Status: DC

## 2023-07-06 NOTE — Progress Notes (Signed)
 Office: [phone]  /  Fax: [phone]  WEIGHT SUMMARY AND BIOMETRICS  Anthropometric Measurements Height: 6' 1 (1.854 m) Weight: 282 lb (127.9 kg) BMI (Calculated): 37.21 Weight at Last Visit: 274 lb Weight Lost Since Last Visit: 0 Weight Gained Since Last Visit: 8 lb Starting Weight: 313 lb Total Weight Loss (lbs): 31 lb (14.1 kg) Peak Weight: 390 lb   Body Composition  Body Fat %: 37.2 % Fat Mass (lbs): 105 lbs Muscle Mass (lbs): 168.6 lbs Total Body Water (lbs): 131 lbs Visceral Fat Rating : 22   Other Clinical Data Fasting: no Labs: no Today's Visit #: 24 Starting Date: 09/25/21    Chief Complaint: OBESITY    History of Present Illness Antonio Horton is a 57 year old male with obesity who presents for obesity treatment and progress assessment.  He has been following a category three eating plan with the option of journaling, aiming for a calorie goal of 1400 to 1700 with 120 or more grams of protein. Adherence to the plan has been about 30% of the time due to recent family events, including his daughter's wedding, which led to increased celebration eating. He has gained eight pounds in the last month.  The hectic preparation for the wedding, which he organized himself, left little time for meal planning and preparation. Consequently, he consumed more convenient and celebratory foods, including leftover meat and cheese trays and crackers. He also noted increased alcohol consumption during the rehearsal dinner at a bar. No discomfort was experienced the following day.  He has resumed his eating plan as of last week, but has been consuming leftover foods, which he describes as not terrible, consisting mainly of meats and cheeses. He acknowledges that these foods are high in sodium and has been trying to hydrate more to manage this.  He is currently taking Rybelsus  and metformin  for his type 2 diabetes. Rybelsus  is effective as long as he avoids sugar,  which can cause gastrointestinal issues. He is also taking a vitamin supplement and will need refills for Rybelsus  and the vitamin before his next visit.      PHYSICAL EXAM:  Blood pressure 108/72, pulse 77, temperature 98.1 F (36.7 C), height 6' 1 (1.854 m), weight 282 lb (127.9 kg), SpO2 97%. Body mass index is 37.21 kg/m.  DIAGNOSTIC DATA REVIEWED:  BMET    Component Value Date/Time   NA 142 01/06/2023 0847   K 4.5 01/06/2023 0847   CL 104 01/06/2023 0847   CO2 24 01/06/2023 0847   GLUCOSE 122 (H) 01/06/2023 0847   GLUCOSE 138 (H) 07/09/2022 1318   BUN 14 01/06/2023 0847   CREATININE 1.05 01/06/2023 0847   CALCIUM  9.4 01/06/2023 0847   GFRNONAA >60 07/09/2022 1318   GFRAA 95 12/08/2007 0958   Lab Results  Component Value Date   HGBA1C 6.3 (H) 01/06/2023   HGBA1C 6.2 (H) 12/15/2007   Lab Results  Component Value Date   INSULIN  39.7 (H) 01/06/2023   INSULIN  41.9 (H) 09/25/2021   Lab Results  Component Value Date   TSH 1.350 09/25/2021   CBC    Component Value Date/Time   WBC 9.9 07/09/2022 1318   RBC 5.43 07/09/2022 1318   HGB 16.2 07/09/2022 1318   HGB 15.9 09/25/2021 1010   HCT 47.4 07/09/2022 1318   HCT 46.5 09/25/2021 1010   PLT 269 07/09/2022 1318   MCV 87.3 07/09/2022 1318   MCV 91 09/25/2021 1010   MCH 29.8 07/09/2022 1318   MCHC  34.2 07/09/2022 1318   RDW 13.2 07/09/2022 1318   RDW 13.1 09/25/2021 1010   Iron Studies    Component Value Date/Time   IRON 94 04/12/2021 1115   FERRITIN 278.7 04/12/2021 1115   Lipid Panel     Component Value Date/Time   CHOL 108 01/06/2023 0847   TRIG 106 01/06/2023 0847   HDL 37 (L) 01/06/2023 0847   CHOLHDL 3 03/13/2021 0906   VLDL 25.8 03/13/2021 0906   LDLCALC 51 01/06/2023 0847   LDLCALC 117 (H) 03/25/2019 1541   Hepatic Function Panel     Component Value Date/Time   PROT 6.6 01/06/2023 0847   ALBUMIN 4.2 01/06/2023 0847   AST 18 01/06/2023 0847   ALT 19 01/06/2023 0847   ALKPHOS 52  01/06/2023 0847   BILITOT 0.6 01/06/2023 0847   BILIDIR 0.2 03/25/2021 0903      Component Value Date/Time   TSH 1.350 09/25/2021 1010   Nutritional Lab Results  Component Value Date   VD25OH 38.4 01/06/2023   VD25OH 41.5 02/27/2022   VD25OH 19.9 (L) 09/25/2021     Assessment and Plan Assessment & Plan Type 2 Diabetes He is currently on Rybelsus  and metformin  for management. Rybelsus  is effective if sugar intake is avoided due to potential gastrointestinal issues. Upcoming advancements in diabetes treatment may offer better weight loss outcomes than current GLP-1 medications. - Continue Rybelsus  and metformin . - Avoid sugar to prevent gastrointestinal issues.  Obesity He has been following the category three eating plan about 30% of the time due to recent celebrations, resulting in an eight-pound weight gain, with approximately half likely due to water retention from high sodium intake. A short-term low-carb diet was discussed to reduce water retention and lower insulin  levels, aiding in fat loss. The plan involves counting total carbs or following a list of approved foods. Hydration is emphasized to manage fluid retention and prevent dehydration, which can cause headaches. - Continue category three eating plan or consider a short-term low-carb diet. - Hydrate well to manage fluid retention. - Refill Rybelsus  and vitamin D  at [facility] in [place].  Vitamin D  Deficiency He requires ongoing management of vitamin D  deficiency. - Refill vitamin D  supplement at [facility] in [place].  Follow-up He has upcoming appointments in July and August and is considering scheduling a September appointment to ensure availability. - Schedule a follow-up appointment in September.   He was informed of the importance of frequent follow up visits to maximize his success with intensive lifestyle modifications for his multiple health conditions.    Jasmine Mesi, MD

## 2023-07-07 ENCOUNTER — Other Ambulatory Visit (INDEPENDENT_AMBULATORY_CARE_PROVIDER_SITE_OTHER): Payer: Self-pay | Admitting: Family Medicine

## 2023-07-07 ENCOUNTER — Ambulatory Visit: Payer: Self-pay | Admitting: Adult Health

## 2023-07-07 DIAGNOSIS — G4733 Obstructive sleep apnea (adult) (pediatric): Secondary | ICD-10-CM

## 2023-07-07 DIAGNOSIS — E559 Vitamin D deficiency, unspecified: Secondary | ICD-10-CM

## 2023-07-29 NOTE — Telephone Encounter (Signed)
 I received a message from [place] with [employer] [facility]   Order received 07-10-2023 234pm and is in pending process. I asked for stat review.

## 2023-07-29 NOTE — Telephone Encounter (Signed)
 Can we check on this for pt?

## 2023-07-29 NOTE — Telephone Encounter (Signed)
I sent urgent message to [employer] asking them to check on this issue 

## 2023-08-04 ENCOUNTER — Other Ambulatory Visit (INDEPENDENT_AMBULATORY_CARE_PROVIDER_SITE_OTHER): Payer: Self-pay | Admitting: Family Medicine

## 2023-08-04 DIAGNOSIS — E559 Vitamin D deficiency, unspecified: Secondary | ICD-10-CM

## 2023-08-05 ENCOUNTER — Encounter (INDEPENDENT_AMBULATORY_CARE_PROVIDER_SITE_OTHER): Payer: Self-pay | Admitting: Family Medicine

## 2023-08-05 ENCOUNTER — Ambulatory Visit (INDEPENDENT_AMBULATORY_CARE_PROVIDER_SITE_OTHER): Admitting: Family Medicine

## 2023-08-05 VITALS — BP 114/72 | HR 81 | Temp 98.1°F | Ht 73.0 in | Wt 279.0 lb

## 2023-08-05 DIAGNOSIS — I951 Orthostatic hypotension: Secondary | ICD-10-CM

## 2023-08-05 DIAGNOSIS — E559 Vitamin D deficiency, unspecified: Secondary | ICD-10-CM | POA: Diagnosis not present

## 2023-08-05 DIAGNOSIS — E669 Obesity, unspecified: Secondary | ICD-10-CM

## 2023-08-05 DIAGNOSIS — Z7985 Long-term (current) use of injectable non-insulin antidiabetic drugs: Secondary | ICD-10-CM

## 2023-08-05 DIAGNOSIS — E119 Type 2 diabetes mellitus without complications: Secondary | ICD-10-CM

## 2023-08-05 DIAGNOSIS — Z6841 Body Mass Index (BMI) 40.0 and over, adult: Secondary | ICD-10-CM

## 2023-08-05 DIAGNOSIS — E1165 Type 2 diabetes mellitus with hyperglycemia: Secondary | ICD-10-CM

## 2023-08-05 DIAGNOSIS — I7121 Aneurysm of the ascending aorta, without rupture: Secondary | ICD-10-CM | POA: Diagnosis not present

## 2023-08-05 DIAGNOSIS — Z7984 Long term (current) use of oral hypoglycemic drugs: Secondary | ICD-10-CM

## 2023-08-05 DIAGNOSIS — Z6836 Body mass index (BMI) 36.0-36.9, adult: Secondary | ICD-10-CM

## 2023-08-05 MED ORDER — VITAMIN D (ERGOCALCIFEROL) 1.25 MG (50000 UNIT) PO CAPS: 50000.0000 [IU] | ORAL_CAPSULE | ORAL | 0 refills | Status: DC

## 2023-08-05 MED ORDER — RYBELSUS 14 MG PO TABS: 14.0000 mg | ORAL_TABLET | Freq: Every day | ORAL | 1 refills | Status: DC

## 2023-08-05 NOTE — Progress Notes (Signed)
 Office: [phone]  /  Fax: [phone]  WEIGHT SUMMARY AND BIOMETRICS  Anthropometric Measurements Height: 6' 1 (1.854 m) Weight: 279 lb (126.6 kg) BMI (Calculated): 36.82 Weight at Last Visit: 282 lb Weight Lost Since Last Visit: 3 lb Weight Gained Since Last Visit: 0 Starting Weight: 313 lb Total Weight Loss (lbs): 34 lb (15.4 kg) Peak Weight: 400 lb   Body Composition  Body Fat %: 36.1 % Fat Mass (lbs): 36.1 lbs Muscle Mass (lbs): 169.8 lbs Total Body Water (lbs): 128 lbs Visceral Fat Rating : 21   Other Clinical Data Fasting: no Labs: no Today's Visit #: 25 Starting Date: 09/25/21    Chief Complaint: OBESITY   Discussed the use of AI scribe software for clinical note transcription with the patient, who gave verbal consent to proceed.  History of Present Illness Antonio Horton is a 58 year old male with obesity who presents for a discussion of his obesity treatment plan.  He is following a category three eating plan for obesity management, adhering to it successfully about fifty percent of the time, and has lost three pounds in the last month. He is not currently exercising due to the hot weather. He experiences fluctuations in his weight, with rapid changes sometimes occurring over a day. He has been consuming more vegetables, particularly corn and squash from his garden, and is mindful of his protein intake, although he finds it challenging to maintain at night due to lack of support from family members who prefer ordering food. He has been incorporating more seafood into his diet, such as salmon, shrimp, and tuna, and has been preparing meals like crustless quiche for work. He uses spray butter for cooking and is conscious of his calorie intake.  He experiences occasional lightheadedness, which he attributes to low blood pressure, especially when standing up quickly. He is under monitoring for an aortic aneurysm, which was last measured at 5.2 cm. He is  scheduled for a CT scan next week to check for any changes.  He drinks at least three cups of water a day but acknowledges this may not be sufficient in the current heat. No issues with low blood sugar are reported.      PHYSICAL EXAM:  Blood pressure 114/72, pulse 81, temperature 98.1 F (36.7 C), height 6' 1 (1.854 m), weight 279 lb (126.6 kg), SpO2 96%. Body mass index is 36.81 kg/m.  DIAGNOSTIC DATA REVIEWED:  BMET    Component Value Date/Time   NA 142 01/06/2023 0847   K 4.5 01/06/2023 0847   CL 104 01/06/2023 0847   CO2 24 01/06/2023 0847   GLUCOSE 122 (H) 01/06/2023 0847   GLUCOSE 138 (H) 07/09/2022 1318   BUN 14 01/06/2023 0847   CREATININE 1.05 01/06/2023 0847   CALCIUM  9.4 01/06/2023 0847   GFRNONAA >60 07/09/2022 1318   GFRAA 95 12/08/2007 0958   Lab Results  Component Value Date   HGBA1C 6.3 (H) 01/06/2023   HGBA1C 6.2 (H) 12/15/2007   Lab Results  Component Value Date   INSULIN  39.7 (H) 01/06/2023   INSULIN  41.9 (H) 09/25/2021   Lab Results  Component Value Date   TSH 1.350 09/25/2021   CBC    Component Value Date/Time   WBC 9.9 07/09/2022 1318   RBC 5.43 07/09/2022 1318   HGB 16.2 07/09/2022 1318   HGB 15.9 09/25/2021 1010   HCT 47.4 07/09/2022 1318   HCT 46.5 09/25/2021 1010   PLT 269 07/09/2022 1318   MCV 87.3 07/09/2022  1318   MCV 91 09/25/2021 1010   MCH 29.8 07/09/2022 1318   MCHC 34.2 07/09/2022 1318   RDW 13.2 07/09/2022 1318   RDW 13.1 09/25/2021 1010   Iron Studies    Component Value Date/Time   IRON 94 04/12/2021 1115   FERRITIN 278.7 04/12/2021 1115   Lipid Panel     Component Value Date/Time   CHOL 108 01/06/2023 0847   TRIG 106 01/06/2023 0847   HDL 37 (L) 01/06/2023 0847   CHOLHDL 3 03/13/2021 0906   VLDL 25.8 03/13/2021 0906   LDLCALC 51 01/06/2023 0847   LDLCALC 117 (H) 03/25/2019 1541   Hepatic Function Panel     Component Value Date/Time   PROT 6.6 01/06/2023 0847   ALBUMIN 4.2 01/06/2023 0847   AST 18  01/06/2023 0847   ALT 19 01/06/2023 0847   ALKPHOS 52 01/06/2023 0847   BILITOT 0.6 01/06/2023 0847   BILIDIR 0.2 03/25/2021 0903      Component Value Date/Time   TSH 1.350 09/25/2021 1010   Nutritional Lab Results  Component Value Date   VD25OH 38.4 01/06/2023   VD25OH 41.5 02/27/2022   VD25OH 19.9 (L) 09/25/2021     Assessment and Plan Assessment & Plan Aortic Aneurysm A known aortic aneurysm over the top of the J hook measures 5.2 cm. The cardiologist is monitoring it closely, with surgical intervention considered at 5.5 cm. Occasional orthostatic hypotension is likely due to dehydration. Emphasized the importance of maintaining low blood pressure to mitigate risks of delamination or rupture. - Increase hydration to prevent orthostatic hypotension - Undergo CT scan next week to assess aneurysm size - Follow up with cardiologist after CT scan  Orthostatic Hypotension Experiences occasional lightheadedness upon standing, attributed to dehydration, especially in the heat. Discussed the need for adequate hydration and potential electrolyte replacement if engaging in activities causing excessive sweating. - Increase fluid intake to 100 ounces daily - Monitor symptoms and report to cardiologist - Consider electrolyte replacement if engaging in excessive sweating  Type 2 Diabetes Mellitus Managing type 2 diabetes with ribosomal medication and dietary modifications. Emphasized the importance of a balanced diet and exercise for diabetes management. - Continue ribosomal medication - Continue dietary modifications and weight loss efforts  Obesity Actively working on weight loss, with a three-pound reduction since the last visit. Following a category three eating plan successfully 50% of the time. Not currently exercising due to hot weather. Consuming more vegetables and maintaining protein intake. Discussed the importance of hydration and potential fluid fluctuations due to heat and  sodium intake. Encouraged to continue dietary efforts and explore seafood options for protein. Provided recipes for low-calorie, high-protein desserts to support dietary goals. - Continue category three eating plan - Encourage increased hydration, aiming for 100 ounces of water daily - Explore seafood options for protein intake - Incorporate low-calorie, high-protein dessert recipes      He was informed of the importance of frequent follow up visits to maximize his success with intensive lifestyle modifications for his multiple health conditions.    Louann Penton, MD

## 2023-08-11 ENCOUNTER — Ambulatory Visit (HOSPITAL_COMMUNITY)
Admission: RE | Admit: 2023-08-11 | Discharge: 2023-08-11 | Discharge status: Home / Self Care | Source: Ambulatory Visit | Attending: Thoracic Surgery (Cardiothoracic Vascular Surgery) | Admitting: Thoracic Surgery (Cardiothoracic Vascular Surgery)

## 2023-08-11 DIAGNOSIS — I7121 Aneurysm of the ascending aorta, without rupture: Secondary | ICD-10-CM | POA: Insufficient documentation

## 2023-08-11 LAB — POCT I-STAT CREATININE: Creatinine, Ser: 1.3 mg/dL — ABNORMAL HIGH (ref 0.61–1.24)

## 2023-08-11 MED ORDER — IOHEXOL 350 MG/ML SOLN
75.0000 mL | Freq: Once | INTRAVENOUS | Status: AC | PRN
  Administered 2023-08-11: 75 mL via INTRAVENOUS

## 2023-08-18 ENCOUNTER — Ambulatory Visit
Attending: Thoracic Surgery (Cardiothoracic Vascular Surgery) | Admitting: Thoracic Surgery (Cardiothoracic Vascular Surgery)

## 2023-08-18 ENCOUNTER — Encounter: Payer: Self-pay | Admitting: Thoracic Surgery (Cardiothoracic Vascular Surgery)

## 2023-08-18 VITALS — BP 119/76 | HR 67 | Resp 20 | Ht 73.0 in | Wt 278.0 lb

## 2023-08-18 DIAGNOSIS — I7121 Aneurysm of the ascending aorta, without rupture: Secondary | ICD-10-CM

## 2023-08-18 NOTE — Progress Notes (Signed)
 [address] [place] [address]             [phone]     HPI: Mr. Antonio Horton returns for follow-up of his ascending aneurysm.  Antonio Horton is a 58 year old man with a history of morbid obesity, hypertension, hyperlipidemia, sleep apnea, type 2 diabetes, vasovagal syncope, and an ascending aortic aneurysm.  He was first found to have an aneurysm in November 2022.  Reported as 5.2.  Followed since that time.  Has measured at 5 cm on each occasion.  He has lost over 100 pounds over the past 2 years.  Checks his blood pressure couple times a week and has [facility] checked [facility].  Generally systolic below 120.  No chest pain, pressure, or tightness.  Exercise is limited by a bad knee and back.  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   Borderline diabetes    Cholelithiases 11/25/2020   COVID-19 virus infection 06/14/2020   Diabetes mellitus without complication (HCC)    Hepatic steatosis 12/05/2020   Hyperlipidemia    Hyperlipidemia associated with type 2 diabetes mellitus (HCC) 09/20/2021   Hypertension    Hypoxemia 11/25/2020   Other fatigue 09/25/2021   S/P laparoscopic cholecystectomy 11/27/2020   Sepsis (HCC) 11/25/2020   Sleep apnea    wears CPAP   SOBOE (shortness of breath on exertion) 09/25/2021   Syncope 11/25/2020   Vasovagal reaction      Current Outpatient Medications  Medication Sig Dispense Refill   lisinopril -hydrochlorothiazide  (ZESTORETIC ) 10-12.5 MG tablet TAKE 1 TABLET BY MOUTH EVERY DAY FOR BLOOD PRESSURE 90 tablet 2   metFORMIN  (GLUCOPHAGE -XR) 500 MG 24 hr tablet TAKE 2 TABLETS (1,000 MG TOTAL) BY MOUTH DAILY WITH BREAKFAST. FOR DIABETES. 180 tablet 1   prednisoLONE acetate (PRED FORTE) 1 % ophthalmic suspension Place 1 drop into the left eye daily.      rosuvastatin  (CRESTOR ) 5 MG tablet TAKE 1 TABLET (5 MG TOTAL) BY MOUTH DAILY FOR CHOLESTEROL  90 tablet 2   Semaglutide  (RYBELSUS ) 14 MG TABS Take 1 tablet (14 mg total) by mouth daily. 30 tablet 1   Vitamin D , Ergocalciferol , (DRISDOL ) 1.25 MG (50000 UNIT) CAPS capsule Take 1 capsule (50,000 Units total) by mouth every 7 (seven) days. 4 capsule 0   No current facility-administered medications for this visit.    Physical Exam BP 119/76   Pulse 67   Resp 20   Ht 6' 1 (1.854 m)   Wt 278 lb (126.1 kg)   SpO2 97% Comment: RA  BMI 36.60 kg/m  58 year old man in no acute distress Alert and oriented x 3 with no focal deficits Lungs clear bilaterally Cardiac regular rate and rhythm with no murmur No carotid bruits No peripheral edema  Diagnostic Tests: CT ANGIOGRAPHY CHEST WITH CONTRAST   TECHNIQUE: Multidetector CT imaging of the chest was performed using the standard protocol during bolus administration of intravenous contrast. Multiplanar CT image reconstructions and MIPs were obtained to evaluate the vascular anatomy.   RADIATION DOSE REDUCTION: This exam was performed according to the departmental dose-optimization program which includes automated exposure control, adjustment of the mA and/or kV according to patient size and/or use of iterative reconstruction technique.   CONTRAST:  75mL OMNIPAQUE  IOHEXOL  350 MG/ML SOLN   COMPARISON:  Chest CT dated 02/03/2023.   FINDINGS: Cardiovascular: There is no cardiomegaly or pericardial effusion. Stable 5 cm ascending aortic  aneurysm. No aortic dissection. The origins of the great vessels of the aortic arch appear patent. The central pulmonary arteries are grossly unremarkable.   Mediastinum/Nodes: No hilar or mediastinal adenopathy. The esophagus is grossly unremarkable. No mediastinal fluid collection.   Lungs/Pleura: Mild eventration of the right hemidiaphragm. No focal consolidation, pleural effusion or pneumothorax. The central airways are patent.   Upper Abdomen: No acute abnormality.   Musculoskeletal: No  acute osseous pathology.  Degenerative changes.   Review of the MIP images confirms the above findings.   IMPRESSION: 1. Stable 5 cm ascending aortic aneurysm. Ascending thoracic aortic aneurysm. Recommend semi-annual imaging followup by CTA or MRA and referral to cardiothoracic surgery if not already obtained. This recommendation follows 2010 ACCF/AHA/AATS/ACR/ASA/SCA/SCAI/SIR/STS/SVM Guidelines for the Diagnosis and Management of Patients With Thoracic Aortic Disease. Circulation. 2010; 121: Z733-z630. Aortic aneurysm NOS (ICD10-I71.9) 2. No acute intrathoracic pathology.     Electronically Signed   By: Vanetta Chou M.D.   On: 08/12/2023 17:09 I personally reviewed the CT images.  5 cm ascending aneurysm.  Minimal coronary calcification.  Impression: Antonio Horton is a 58 year old man with a history of morbid obesity, hypertension, hyperlipidemia, sleep apnea, type 2 diabetes, vasovagal syncope, and an ascending aortic aneurysm.  Ascending aneurysm-stable at 5 cm.  Needs continued semiannual follow-up.  Hypertension-blood pressure well-controlled on Zestoretic .  Type 2 diabetes-on metformin  and Rybelsus   Obesity-has done a tremendous job with about 100 pound weight loss over the past 2 years.  Plan: Return in 6 months with CT angiogram of chest  Antonio JAYSON Millers, MD [facility] [phone]

## 2023-09-02 ENCOUNTER — Ambulatory Visit (INDEPENDENT_AMBULATORY_CARE_PROVIDER_SITE_OTHER): Admitting: Family Medicine

## 2023-09-02 ENCOUNTER — Encounter (INDEPENDENT_AMBULATORY_CARE_PROVIDER_SITE_OTHER): Payer: Self-pay | Admitting: Family Medicine

## 2023-09-02 ENCOUNTER — Telehealth: Payer: Self-pay | Admitting: *Deleted

## 2023-09-02 VITALS — BP 109/73 | HR 83 | Temp 97.9°F | Ht 73.0 in | Wt 278.0 lb

## 2023-09-02 DIAGNOSIS — Z6836 Body mass index (BMI) 36.0-36.9, adult: Secondary | ICD-10-CM

## 2023-09-02 DIAGNOSIS — E559 Vitamin D deficiency, unspecified: Secondary | ICD-10-CM | POA: Diagnosis not present

## 2023-09-02 DIAGNOSIS — E119 Type 2 diabetes mellitus without complications: Secondary | ICD-10-CM

## 2023-09-02 DIAGNOSIS — Z7984 Long term (current) use of oral hypoglycemic drugs: Secondary | ICD-10-CM

## 2023-09-02 DIAGNOSIS — E1165 Type 2 diabetes mellitus with hyperglycemia: Secondary | ICD-10-CM

## 2023-09-02 DIAGNOSIS — E66813 Obesity, class 3: Secondary | ICD-10-CM

## 2023-09-02 DIAGNOSIS — Z7985 Long-term (current) use of injectable non-insulin antidiabetic drugs: Secondary | ICD-10-CM

## 2023-09-02 DIAGNOSIS — E669 Obesity, unspecified: Secondary | ICD-10-CM | POA: Diagnosis not present

## 2023-09-02 MED ORDER — VITAMIN D (ERGOCALCIFEROL) 1.25 MG (50000 UNIT) PO CAPS: 50000.0000 [IU] | ORAL_CAPSULE | ORAL | 0 refills | Status: DC

## 2023-09-02 MED ORDER — TIRZEPATIDE 10 MG/0.5ML ~~LOC~~ SOAJ
10.0000 mg | SUBCUTANEOUS | 0 refills | Status: DC
Start: 2023-09-02 — End: 2023-09-30

## 2023-09-02 NOTE — Telephone Encounter (Signed)
 Received CMN for CPAP supplies.  Form signed by Madelin Stank NP and faxed to [facility]  Received a confirmation fax that it was sent successfully.  Placed in scan folder.  Nothing further needed.

## 2023-09-02 NOTE — Progress Notes (Signed)
 Office: [phone]  /  Fax: [phone]  WEIGHT SUMMARY AND BIOMETRICS  Anthropometric Measurements Height: 6' 1 (1.854 m) Weight: 278 lb (126.1 kg) BMI (Calculated): 36.69 Weight at Last Visit: 279 lb Weight Lost Since Last Visit: 1 lb Weight Gained Since Last Visit: 0 Starting Weight: 313 lb Total Weight Loss (lbs): 35 lb (15.9 kg) Peak Weight: 400 lb   Body Composition  Body Fat %: 36.4 % Fat Mass (lbs): 101.2 lbs Muscle Mass (lbs): 168.2 lbs Total Body Water (lbs): 127.6 lbs Visceral Fat Rating : 21   Other Clinical Data Fasting: no Labs: no Today's Visit #: 26 Starting Date: 09/25/21    Chief Complaint: OBESITY   History of Present Illness Antonio Horton is a 58 year old male with obesity and type 2 diabetes who presents for obesity treatment and progress assessment.  He is adhering to a category three eating plan about fifty percent of the time and engages in physical activity by performing yard work for three hours, two days a week. Over the past month, he has experienced a weight loss of one pound. He reports a decreased appetite, particularly in hot weather, which complicates his ability to consume adequate nutrition. His meal pattern includes planning for breakfast, a consistent lunch, and delayed hunger until 9 PM for dinner.  He has a vitamin D  deficiency managed with prescription vitamin D  at a dose of fifty thousand international units weekly. He requests a refill for this medication.  He manages his type 2 diabetes with Rybelsus  14 mg daily and metformin  XR 500 mg, two tablets daily. He requests a refill of his Rybelsus . His most recent hemoglobin A1c was 6.3. He experiences increased gas and bloating, especially in the heat, which he associates with his medication and irregular eating patterns.  He typically shops for groceries at [employer] and a [employer]. He has tried [employer] but found them unsuitable  and is considering trying [employer] protein shakes.      PHYSICAL EXAM:  Blood pressure 109/73, pulse 83, temperature 97.9 F (36.6 C), height 6' 1 (1.854 m), weight 278 lb (126.1 kg), SpO2 97%. Body mass index is 36.68 kg/m.  DIAGNOSTIC DATA REVIEWED:  BMET    Component Value Date/Time   NA 142 01/06/2023 0847   K 4.5 01/06/2023 0847   CL 104 01/06/2023 0847   CO2 24 01/06/2023 0847   GLUCOSE 122 (H) 01/06/2023 0847   GLUCOSE 138 (H) 07/09/2022 1318   BUN 14 01/06/2023 0847   CREATININE 1.30 (H) 08/11/2023 1310   CALCIUM  9.4 01/06/2023 0847   GFRNONAA >60 07/09/2022 1318   GFRAA 95 12/08/2007 0958   Lab Results  Component Value Date   HGBA1C 6.3 (H) 01/06/2023   HGBA1C 6.2 (H) 12/15/2007   Lab Results  Component Value Date   INSULIN  39.7 (H) 01/06/2023   INSULIN  41.9 (H) 09/25/2021   Lab Results  Component Value Date   TSH 1.350 09/25/2021   CBC    Component Value Date/Time   WBC 9.9 07/09/2022 1318   RBC 5.43 07/09/2022 1318   HGB 16.2 07/09/2022 1318   HGB 15.9 09/25/2021 1010   HCT 47.4 07/09/2022 1318   HCT 46.5 09/25/2021 1010   PLT 269 07/09/2022 1318   MCV 87.3 07/09/2022 1318   MCV 91 09/25/2021 1010   MCH 29.8 07/09/2022 1318   MCHC 34.2 07/09/2022 1318   RDW 13.2 07/09/2022 1318   RDW 13.1 09/25/2021 1010  Iron Studies    Component Value Date/Time   IRON 94 04/12/2021 1115   FERRITIN 278.7 04/12/2021 1115   Lipid Panel     Component Value Date/Time   CHOL 108 01/06/2023 0847   TRIG 106 01/06/2023 0847   HDL 37 (L) 01/06/2023 0847   CHOLHDL 3 03/13/2021 0906   VLDL 25.8 03/13/2021 0906   LDLCALC 51 01/06/2023 0847   LDLCALC 117 (H) 03/25/2019 1541   Hepatic Function Panel     Component Value Date/Time   PROT 6.6 01/06/2023 0847   ALBUMIN 4.2 01/06/2023 0847   AST 18 01/06/2023 0847   ALT 19 01/06/2023 0847   ALKPHOS 52 01/06/2023 0847   BILITOT 0.6 01/06/2023 0847   BILIDIR 0.2 03/25/2021 0903      Component Value  Date/Time   TSH 1.350 09/25/2021 1010   Nutritional Lab Results  Component Value Date   VD25OH 38.4 01/06/2023   VD25OH 41.5 02/27/2022   VD25OH 19.9 (L) 09/25/2021     Assessment and Plan Assessment & Plan Obesity Obesity management is ongoing with a category three eating plan, adhered to approximately 50% of the time. Exercise includes yard work twice weekly for three hours per session. Weight loss of one pound over the last month. Challenges with appetite due to heat and outdoor activity, leading to insufficient protein intake. Fairlife protein shakes recommended due to intolerance to [employer]. - Encourage adherence to the eating plan. - Recommend Fairlife protein shakes for additional protein intake. - Advise sipping protein drinks slowly to aid digestion.  Type 2 diabetes mellitus Type 2 diabetes is managed with Rybelsus  14 mg daily and Metformin  XR 500 mg, two tablets daily. Recent hemoglobin A1c was 6.3%. Experiencing increased gastrointestinal side effects, possibly due to Rybelsus  and irregular eating patterns. Discussed potential switch to Mounjaro , which may offer greater weight loss benefits and potentially fewer side effects. Mounjaro  is associated with approximately twice as much weight loss compared to Rybelsus . Agreed to trial Mounjaro  with a starting dose lower than Rybelsus  to assess tolerance. Prior authorization required for Mounjaro , with a plan to revert to Rybelsus  if not approved. - Initiate prior authorization for Mounjaro . - Prescribe Mounjaro  at a starting dose of 10 mg once weekly. - Continue Metformin  XR 500 mg, two tablets daily. - Monitor for side effects and adjust dosage as needed. - Refill Rybelsus  if Mounjaro  is not approved.  Vitamin D  deficiency Vitamin D  deficiency is being treated with prescription vitamin D , 50,000 IU per week. - Refill prescription vitamin D , 50,000 IU weekly.     He was informed of the importance of frequent follow up  visits to maximize his success with intensive lifestyle modifications for his multiple health conditions.    Louann Penton, MD

## 2023-09-03 ENCOUNTER — Telehealth (INDEPENDENT_AMBULATORY_CARE_PROVIDER_SITE_OTHER): Payer: Self-pay

## 2023-09-09 NOTE — Telephone Encounter (Signed)
 Message from Plan Request Reference Number: [id number].  MOUNJARO  INJ 10MG /0.5 is approved through 09/08/2024.  Your patient may now fill this prescription and it will be covered..  Authorization Expiration Date: September 08, 2024.  Patient has been notified via Mychart

## 2023-09-09 NOTE — Telephone Encounter (Signed)
 Lonni Console (Key: [id number])  Your information has been sent to [facility].

## 2023-09-30 ENCOUNTER — Telehealth (INDEPENDENT_AMBULATORY_CARE_PROVIDER_SITE_OTHER): Payer: Self-pay

## 2023-09-30 ENCOUNTER — Other Ambulatory Visit (HOSPITAL_COMMUNITY): Payer: Self-pay

## 2023-09-30 ENCOUNTER — Ambulatory Visit (INDEPENDENT_AMBULATORY_CARE_PROVIDER_SITE_OTHER): Admitting: Family Medicine

## 2023-09-30 ENCOUNTER — Encounter (INDEPENDENT_AMBULATORY_CARE_PROVIDER_SITE_OTHER): Payer: Self-pay | Admitting: Family Medicine

## 2023-09-30 VITALS — BP 109/75 | HR 83 | Temp 97.8°F | Ht 73.0 in | Wt 280.0 lb

## 2023-09-30 DIAGNOSIS — M25559 Pain in unspecified hip: Secondary | ICD-10-CM

## 2023-09-30 DIAGNOSIS — Z7985 Long-term (current) use of injectable non-insulin antidiabetic drugs: Secondary | ICD-10-CM

## 2023-09-30 DIAGNOSIS — M545 Low back pain, unspecified: Secondary | ICD-10-CM

## 2023-09-30 DIAGNOSIS — Z6836 Body mass index (BMI) 36.0-36.9, adult: Secondary | ICD-10-CM

## 2023-09-30 DIAGNOSIS — E1165 Type 2 diabetes mellitus with hyperglycemia: Secondary | ICD-10-CM | POA: Diagnosis not present

## 2023-09-30 DIAGNOSIS — G4700 Insomnia, unspecified: Secondary | ICD-10-CM

## 2023-09-30 DIAGNOSIS — M25569 Pain in unspecified knee: Secondary | ICD-10-CM | POA: Diagnosis not present

## 2023-09-30 DIAGNOSIS — E559 Vitamin D deficiency, unspecified: Secondary | ICD-10-CM

## 2023-09-30 DIAGNOSIS — E66812 Obesity, class 2: Secondary | ICD-10-CM

## 2023-09-30 DIAGNOSIS — G8929 Other chronic pain: Secondary | ICD-10-CM

## 2023-09-30 MED ORDER — VITAMIN D (ERGOCALCIFEROL) 1.25 MG (50000 UNIT) PO CAPS: 50000.0000 [IU] | ORAL_CAPSULE | ORAL | 0 refills | Status: DC

## 2023-09-30 MED ORDER — TIRZEPATIDE 10 MG/0.5ML ~~LOC~~ SOAJ
SUBCUTANEOUS | 0 refills | Status: DC
  Filled 2023-09-30 – 2023-10-07 (×3): qty 2, 28d supply, fill #0

## 2023-09-30 NOTE — Telephone Encounter (Signed)
 Call to Antonio Horton to notify him of message from [facility].  He is okay with this plan to wait the 12 days and will call the pharmacy for the refill at that time.  Call ended, no further needs.

## 2023-09-30 NOTE — Telephone Encounter (Signed)
 Call to [facility] about patients Mounjaro  and why can't he get the prescription filled.  Patient recently filled his Rybelsus  on 09/18/2023, insurance is calling it duplicate therapy.  Amberlee from [facility] states that he could try to refill the Mounjaro  in about 12 days, it should go through then.  It has already been approved.

## 2023-09-30 NOTE — Progress Notes (Signed)
 Office: [phone]  /  Fax: [phone]  WEIGHT SUMMARY AND BIOMETRICS  Anthropometric Measurements Height: 6' 1 (1.854 m) Weight: 280 lb (127 kg) BMI (Calculated): 36.95 Weight at Last Visit: 278 lb Weight Lost Since Last Visit: 0 Weight Gained Since Last Visit: 2 lb Starting Weight: 313 lb Total Weight Loss (lbs): 33 lb (15 kg) Peak Weight: 400 lb   Body Composition  Body Fat %: 36.7 % Fat Mass (lbs): 102.8 lbs Muscle Mass (lbs): 168.8 lbs Total Body Water (lbs): 130.2 lbs Visceral Fat Rating : 21   Other Clinical Data Fasting: no Labs: no Today's Visit #: 27 Starting Date: 09/25/21 Comments: cat 3    Chief Complaint: OBESITY  History of Present Illness Antonio Horton is a 58 year old male with obesity and type 2 diabetes who presents for obesity treatment plan assessment and progress evaluation.  He follows his prescribed category three eating plan only 25% of the time and has gained two pounds over the last month. He attempts to eat more fruits and vegetables but reports inadequate hydration and not meeting protein goals. He has not been able to exercise regularly.  He is currently on metformin  for type 2 diabetes and continues to use Rybelsus  due to issues with the preapproval process for Mounjaro . He has most of a bottle of Rybelsus  remaining.  He recently went on vacation, indulging in fried seafood and adult beverages, but engaged in more physical activity, such as swimming, than usual. Post-vacation, he finds it difficult to resume his diet and exercise routine.  He experiences knee swelling, which worsened with increased activity during vacation, and back pain that disrupts his sleep, allowing only five to six hours of rest per night. Sleep aids like melatonin or magnesium result in feeling like a 'zombie' the next day.  His wife's recurrent UTIs have progressed to kidney infections, leading to recent hospitalizations, impacting his ability to  focus on his diet and exercise regimen.      PHYSICAL EXAM:  Blood pressure 109/75, pulse 83, temperature 97.8 F (36.6 C), height 6' 1 (1.854 m), weight 280 lb (127 kg), SpO2 97%. Body mass index is 36.94 kg/m.  DIAGNOSTIC DATA REVIEWED:  BMET    Component Value Date/Time   NA 142 01/06/2023 0847   K 4.5 01/06/2023 0847   CL 104 01/06/2023 0847   CO2 24 01/06/2023 0847   GLUCOSE 122 (H) 01/06/2023 0847   GLUCOSE 138 (H) 07/09/2022 1318   BUN 14 01/06/2023 0847   CREATININE 1.30 (H) 08/11/2023 1310   CALCIUM  9.4 01/06/2023 0847   GFRNONAA >60 07/09/2022 1318   GFRAA 95 12/08/2007 0958   Lab Results  Component Value Date   HGBA1C 6.3 (H) 01/06/2023   HGBA1C 6.2 (H) 12/15/2007   Lab Results  Component Value Date   INSULIN  39.7 (H) 01/06/2023   INSULIN  41.9 (H) 09/25/2021   Lab Results  Component Value Date   TSH 1.350 09/25/2021   CBC    Component Value Date/Time   WBC 9.9 07/09/2022 1318   RBC 5.43 07/09/2022 1318   HGB 16.2 07/09/2022 1318   HGB 15.9 09/25/2021 1010   HCT 47.4 07/09/2022 1318   HCT 46.5 09/25/2021 1010   PLT 269 07/09/2022 1318   MCV 87.3 07/09/2022 1318   MCV 91 09/25/2021 1010   MCH 29.8 07/09/2022 1318   MCHC 34.2 07/09/2022 1318   RDW 13.2 07/09/2022 1318   RDW 13.1 09/25/2021 1010   Iron Studies  Component Value Date/Time   IRON 94 04/12/2021 1115   FERRITIN 278.7 04/12/2021 1115   Lipid Panel     Component Value Date/Time   CHOL 108 01/06/2023 0847   TRIG 106 01/06/2023 0847   HDL 37 (L) 01/06/2023 0847   CHOLHDL 3 03/13/2021 0906   VLDL 25.8 03/13/2021 0906   LDLCALC 51 01/06/2023 0847   LDLCALC 117 (H) 03/25/2019 1541   Hepatic Function Panel     Component Value Date/Time   PROT 6.6 01/06/2023 0847   ALBUMIN 4.2 01/06/2023 0847   AST 18 01/06/2023 0847   ALT 19 01/06/2023 0847   ALKPHOS 52 01/06/2023 0847   BILITOT 0.6 01/06/2023 0847   BILIDIR 0.2 03/25/2021 0903      Component Value Date/Time   TSH  1.350 09/25/2021 1010   Nutritional Lab Results  Component Value Date   VD25OH 38.4 01/06/2023   VD25OH 41.5 02/27/2022   VD25OH 19.9 (L) 09/25/2021     Assessment and Plan Assessment & Plan Obesity, class 2 Obesity management is challenging due to recent vacation and difficulty adhering to the prescribed eating plan. Currently following the category three eating plan only 25% of the time, resulting in a two-pound weight gain over the last month. Increased physical activity during vacation, but ongoing challenges with exercise due to chronic pain and lack of motivation. Not meeting hydration and protein intake goals. - Encourage adherence to the category three eating plan. - Advise on increasing fruit and vegetable intake. - Recommend grocery delivery to ensure healthy food availability. - Suggest meal prepping to improve dietary adherence. - Encourage finding protein sources like protein shakes. - Discuss the importance of hydration.  Type 2 diabetes mellitus with hyperglycemia Currently on metformin  and Rybelsus . Attempted switch to Mounjaro  was unsuccessful due to insurance issues. Plan to switch prescription to [facility] to facilitate insurance approval and delivery. - Switch Mounjaro  prescription to [facility]. - Write a note to the pharmacy to assist with insurance approval. - Instruct to contact via [employer]Trac if issues persist with obtaining Mounjaro .  Chronic knee, hip, and low back pain Chronic pain in the knee, hip, and low back exacerbated by physical activity, leading to difficulty with exercise. Pain impacts sleep quality, contributing to insomnia. Discussed potential need for surgical intervention for knee pain, but he is hesitant. - Encourage discussion with a surgeon regarding knee pain management.  Insomnia Insomnia related to chronic back pain, resulting in poor sleep quality and daytime fatigue. Previous attempts with melatonin and  magnesium were unsuccessful due to next-day drowsiness. - Suggest trying melatonin or magnesium earlier in the evening, possibly on a weekend to assess effects.       Antonio Horton was informed of the importance of frequent follow up visits to maximize his success with intensive lifestyle modifications for his obesity and obesity related health conditions as recommended by USPSTF and CMS guidelines   Antonio Penton, MD

## 2023-10-07 ENCOUNTER — Other Ambulatory Visit (HOSPITAL_COMMUNITY): Payer: Self-pay

## 2023-10-13 ENCOUNTER — Ambulatory Visit: Payer: Self-pay | Admitting: Primary Care

## 2023-10-13 ENCOUNTER — Ambulatory Visit: Admitting: Primary Care

## 2023-10-13 ENCOUNTER — Encounter: Payer: Self-pay | Admitting: Primary Care

## 2023-10-13 VITALS — BP 102/60 | HR 78 | Temp 97.5°F | Ht 73.0 in | Wt 284.0 lb

## 2023-10-13 DIAGNOSIS — E1165 Type 2 diabetes mellitus with hyperglycemia: Secondary | ICD-10-CM

## 2023-10-13 LAB — POCT GLYCOSYLATED HEMOGLOBIN (HGB A1C): Hemoglobin A1C: 5.9 % — AB (ref 4.0–5.6)

## 2023-10-13 NOTE — Assessment & Plan Note (Signed)
 Improved with A1C of 5.9 today!  Continue metformin  ER 1000 mg daily, Rybelsus  14 mg daily for now.  He will transition to Mounjaro  10 mg soon.   Discussed to work on diet, start exercising.  Foot exam today.  Follow up in 6 months.

## 2023-10-13 NOTE — Patient Instructions (Signed)
 Work on [employer].  Start exercising. You should be getting 150 minutes of moderate intensity exercise weekly.  Please schedule a physical to meet with me in 6 months.   It was a pleasure to see you today!

## 2023-10-13 NOTE — Progress Notes (Signed)
 Subjective:    Patient ID: Shaylon Aden, male    DOB: July 07, 1965, 58 y.o.   MRN: [id number]  Kaire Stary is a very pleasant 58 y.o. male with a history of hypertension, aortic aneurysm, CAD, OSA, type 2 diabetes, hyperlipidemia who presents today for follow-up of diabetes.  Current medications include: Mounjaro  10 mg weekly, metformin  ER 1000 mg daily. He has yet to start Mounjaro  due to insurance, he is taking Rybelsus  14 mg now.   He is checking his blood glucose 0 times daily.  Last A1C: 6.3 in December 2024, 5.9 today Last Eye Exam: Up-to-date Last Foot Exam: Due Pneumonia Vaccination: 2021 Urine Microalbumin: Up-to-date Statin: Rosuvastatin   Dietary changes since last visit: Off his healthy diet since going to the beach about 1 month ago. Increased consumption of bread an potatoes.    Exercise: None  Wt Readings from Last 3 Encounters:  10/13/23 284 lb (128.8 kg)  09/30/23 280 lb (127 kg)  09/02/23 278 lb (126.1 kg)      Review of Systems  Respiratory:  Negative for shortness of breath.   Cardiovascular:  Negative for chest pain.  Gastrointestinal:  Negative for nausea.  Neurological:  Negative for numbness.         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   Borderline diabetes    Cholelithiases 11/25/2020   COVID-19 virus infection 06/14/2020   Diabetes mellitus without complication (HCC)    Hepatic steatosis 12/05/2020   Hyperlipidemia    Hyperlipidemia associated with type 2 diabetes mellitus (HCC) 09/20/2021   Hypertension    Hypoxemia 11/25/2020   Other fatigue 09/25/2021   S/P laparoscopic cholecystectomy 11/27/2020   Sepsis (HCC) 11/25/2020   Sleep apnea    wears CPAP   SOBOE (shortness of breath on exertion) 09/25/2021   Syncope 11/25/2020   Vasovagal reaction     Social History   Socioeconomic History   Marital status: Married    Spouse name: Not on file   Number of  children: Not on file   Years of education: Not on file   Highest education level: Not on file  Occupational History   Not on file  Tobacco Use   Smoking status: Never   Smokeless tobacco: Never  Vaping Use   Vaping status: Never Used  Substance and Sexual Activity   Alcohol use: Not Currently    Alcohol/week: 0.0 standard drinks of alcohol    Comment: socially   Drug use: No   Sexual activity: Not on file  Other Topics Concern   Not on file  Social History Narrative   Married.   2 children.   Works as a Games developer.   Enjoys reading.    Social Drivers of Corporate investment banker Strain: Not on file  Food Insecurity: Not on file  Transportation Needs: Not on file  Physical Activity: Not on file  Stress: Not on file  Social Connections: Not on file  Intimate Partner Violence: Not on file    Past Surgical History:  Procedure Laterality Date   CHOLECYSTECTOMY N/A 11/27/2020   Procedure: LAPAROSCOPIC CHOLECYSTECTOMY;  Surgeon: Sebastian Moles, MD;  Location: [facility] OR;  Service: General;  Laterality: N/A;   EYE SURGERY     Corneal dystrophies    Family History  Adopted: Yes  Problem Relation Age of Onset   Other Other        adopted   Thyroid  disease Neg Hx  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    Current Outpatient Medications on File Prior to Visit  Medication Sig Dispense Refill   lisinopril -hydrochlorothiazide  (ZESTORETIC ) 10-12.5 MG tablet TAKE 1 TABLET BY MOUTH EVERY DAY FOR BLOOD PRESSURE 90 tablet 2   metFORMIN  (GLUCOPHAGE -XR) 500 MG 24 hr tablet TAKE 2 TABLETS (1,000 MG TOTAL) BY MOUTH DAILY WITH BREAKFAST. FOR DIABETES. 180 tablet 1   prednisoLONE acetate (PRED FORTE) 1 % ophthalmic suspension Place 1 drop into the left eye daily.      rosuvastatin  (CRESTOR ) 5 MG tablet TAKE 1 TABLET (5 MG TOTAL) BY MOUTH DAILY FOR CHOLESTEROL 90 tablet 2   tirzepatide  (MOUNJARO ) 10 MG/0.5ML Pen Inject 1 pen (10mg ) once  weekly. 2 mL 0   Vitamin D , Ergocalciferol , (DRISDOL ) 1.25 MG (50000 UNIT) CAPS capsule Take 1 capsule (50,000 Units total) by mouth every 7 (seven) days. 4 capsule 0   No current facility-administered medications on file prior to visit.    BP 102/60   Pulse 78   Temp (!) 97.5 F (36.4 C) (Temporal)   Ht 6' 1 (1.854 m)   Wt 284 lb (128.8 kg)   SpO2 98%   BMI 37.47 kg/m  Objective:   Physical Exam Cardiovascular:     Rate and Rhythm: Normal rate and regular rhythm.  Pulmonary:     Effort: Pulmonary effort is normal.     Breath sounds: Normal breath sounds.  Musculoskeletal:     Cervical back: Neck supple.  Skin:    General: Skin is warm and dry.  Neurological:     Mental Status: He is alert and oriented to person, place, and time.  Psychiatric:        Mood and Affect: Mood normal.     Physical Exam        Assessment & Plan:  Type 2 diabetes mellitus with hyperglycemia, without long-term current use of insulin  (HCC) Assessment & Plan: Improved with A1C of 5.9 today!  Continue metformin  ER 1000 mg daily, Rybelsus  14 mg daily for now.  He will transition to Mounjaro  10 mg soon.   Discussed to work on diet, start exercising.  Foot exam today.  Follow up in 6 months.    Orders: -     POCT glycosylated hemoglobin (Hb A1C)    Assessment and Plan Assessment & Plan         Comer MARLA Gaskins, NP  History of Present Illness

## 2023-10-23 ENCOUNTER — Ambulatory Visit (INDEPENDENT_AMBULATORY_CARE_PROVIDER_SITE_OTHER): Admitting: Adult Health

## 2023-10-23 ENCOUNTER — Encounter: Payer: Self-pay | Admitting: Adult Health

## 2023-10-23 VITALS — BP 120/74 | HR 85 | Temp 98.0°F | Ht 73.0 in | Wt 286.0 lb

## 2023-10-23 DIAGNOSIS — G4733 Obstructive sleep apnea (adult) (pediatric): Secondary | ICD-10-CM | POA: Diagnosis not present

## 2023-10-23 DIAGNOSIS — Z6837 Body mass index (BMI) 37.0-37.9, adult: Secondary | ICD-10-CM

## 2023-10-23 NOTE — Progress Notes (Signed)
 @Patient  ID: Antonio Horton, male    DOB: [birth year], 58 y.o.   MRN: [id number]  Chief Complaint  Patient presents with   Medical Management of Chronic Issues    Cpap compliance     Referring provider: Gretta Comer POUR, NP  HPI: 58 year old male seen for sleep consult Jun 16, 2023 to establish for sleep apnea. Patient was diagnosed with sleep apnea greater than 20 years ago and has been on CPAP since diagnosis Medical history significant for AAA, diabetes, hyperlipidemia   TEST/EVENTS :  Discussed the use of AI scribe software for clinical note transcription with the patient, who gave verbal consent to proceed.  History of Present Illness Antonio Horton is a 58 year old male with sleep apnea who presents for a follow-up after receiving a new CPAP machine and sleep study.  He has been using the new CPAP machine nightly for six to six and a half hours with a hybrid full face mask. He reports improved sleep quality since using the new machine. He notes some noise from the machine, which he attributes to getting used to the new device and possibly due to humidification settings. Facial hair may affect the mask fit, but overall, he is satisfied with the machine's performance.  The new CPAP machine has an automated feature that activates when it senses he is wearing it, unlike his previous 57 year old machine. He appreciates this feature but has experienced issues with the machine turning on unexpectedly during the day, possibly due to hitting the pause button instead of turning it on. He uses distilled water in the chamber and prefers cooler air, . He is concerned about accidentally changing settings, especially with his 59-month-old grandson around, who might press buttons on the machine.  He is currently on Rybelsus  for diabetes management and has been considering switching to Mounjaro ,  His partner has had success with Mounjaro , which has also been noted to aid in weight loss  and improve sleep apnea symptoms.  Last visit patient required a repeat sleep study in order to get new CPAP machine sleep study done on June 24, 2023 showed moderate sleep apnea with AHI 27.7/hour and SpO2 low at 85%.  CPAP download shows excellent compliance with 100% usage daily average usage at 6.5 hours AHI 0.6/hour patient is on auto CPAP 5 to 15 cm H2O.  Daily average pressure 9.8 cm H2O.   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    Immunization History  Administered Date(s) Administered   Hepatitis A, Adult 04/03/2021, 10/07/2021   Hepb-cpg 04/03/2021, 05/06/2021   Moderna Sars-Covid-2 Vaccination 10/04/2019, 11/01/2019   Pneumococcal Polysaccharide-23 01/24/2019   Td 03/10/2008   Tdap 09/27/2019   Zoster Recombinant(Shingrix) 03/28/2020, 03/13/2021    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   Borderline diabetes    Cholelithiases 11/25/2020   COVID-19 virus infection 06/14/2020   Diabetes mellitus without complication (HCC)    Hepatic steatosis 12/05/2020   Hyperlipidemia    Hyperlipidemia associated with type 2 diabetes mellitus (HCC) 09/20/2021   Hypertension    Hypoxemia 11/25/2020   Other fatigue 09/25/2021   S/P laparoscopic cholecystectomy 11/27/2020   Sepsis (HCC) 11/25/2020   Sleep apnea    wears CPAP   SOBOE (shortness of breath on exertion) 09/25/2021   Syncope 11/25/2020   Vasovagal reaction     Tobacco History: Social History   Tobacco Use  Smoking Status Never  Smokeless  Tobacco Never   Counseling given: Not Answered   Outpatient Medications Prior to Visit  Medication Sig Dispense Refill   lisinopril -hydrochlorothiazide  (ZESTORETIC ) 10-12.5 MG tablet TAKE 1 TABLET BY MOUTH EVERY DAY FOR BLOOD PRESSURE 90 tablet 2   metFORMIN  (GLUCOPHAGE -XR) 500 MG 24 hr tablet TAKE 2 TABLETS (1,000 MG TOTAL) BY MOUTH DAILY WITH BREAKFAST. FOR DIABETES. 180 tablet 1    prednisoLONE acetate (PRED FORTE) 1 % ophthalmic suspension Place 1 drop into the left eye daily.      rosuvastatin  (CRESTOR ) 5 MG tablet TAKE 1 TABLET (5 MG TOTAL) BY MOUTH DAILY FOR CHOLESTEROL 90 tablet 2   tirzepatide  (MOUNJARO ) 10 MG/0.5ML Pen Inject 1 pen (10mg ) once weekly. 2 mL 0   Vitamin D , Ergocalciferol , (DRISDOL ) 1.25 MG (50000 UNIT) CAPS capsule Take 1 capsule (50,000 Units total) by mouth every 7 (seven) days. 4 capsule 0   No facility-administered medications prior to visit.     Review of Systems:   Constitutional:   No  weight loss, night sweats,  Fevers, chills, fatigue, or  lassitude.  HEENT:   No headaches,  Difficulty swallowing,  Tooth/dental problems, or  Sore throat,                No sneezing, itching, ear ache, nasal congestion, post nasal drip,   CV:  No chest pain,  Orthopnea, PND, swelling in lower extremities, anasarca, dizziness, palpitations, syncope.   GI  No heartburn, indigestion, abdominal pain, nausea, vomiting, diarrhea, change in bowel habits, loss of appetite, bloody stools.   Resp: No shortness of breath with exertion or at rest.  No excess mucus, no productive cough,  No non-productive cough,  No coughing up of blood.  No change in color of mucus.  No wheezing.  No chest wall deformity  Skin: no rash or lesions.  GU: no dysuria, change in color of urine, no urgency or frequency.  No flank pain, no hematuria   MS:  No joint pain or swelling.  No decreased range of motion.  No back pain.    Physical Exam  BP 120/74   Pulse 85   Temp 98 F (36.7 C) (Temporal)   Ht 6' 1 (1.854 m)   Wt 286 lb (129.7 kg)   SpO2 95%   BMI 37.73 kg/m   GEN: A/Ox3; pleasant , NAD, well nourished    HEENT:  [place]/AT,   NOSE-clear, THROAT-clear, no lesions, no postnasal drip or exudate noted.   NECK:  Supple w/ fair ROM; no JVD; normal carotid impulses w/o bruits; no thyromegaly or nodules palpated; no lymphadenopathy.    RESP  Clear  P & A; w/o, wheezes/  rales/ or rhonchi. no accessory muscle use, no dullness to percussion  CARD:  RRR, no m/r/g, no peripheral edema, pulses intact, no cyanosis or clubbing.  GI:   Soft & nt; nml bowel sounds; no organomegaly or masses detected.   Musco: Warm bil, no deformities or joint swelling noted.   Neuro: alert, no focal deficits noted.    Skin: Warm, no lesions or rashes    Lab Results:  CBC     BNP No results found for: BNP  ProBNP No results found for: PROBNP  Imaging: No results found.  Administration History     None           No data to display          No results found for: NITRICOXIDE      Assessment & Plan:  Assessment and Plan Assessment & Plan Obstructive sleep apnea   Obstructive sleep apnea is well-controlled with the new CPAP machine. Has perceived benefit and  uses the machine consistently for 6 to 6.5 hours per night. . Temperature and humidity settings have been adjusted to address previous issues. He uses a hybrid full face  reports improved sleep quality, and no longer wakes up gasping for air. Adjust the CPAP machine temperature to 68 degrees for cooler air. Instruct on manual adjustment of temperature and humidity settings. Advise contacting the home care company if the machine feels warm after adjustments. Schedule an annual follow-up visit unless issues arise.  Obesity   He is currently on Rybelsus  for weight management, prescribed by Dr. Verdon [facility]. There is a plan to switch to tirzepatide  (Mounjaro /Zepbound ) for its benefits in weight loss and sleep apnea improvement.  Continue follow-up with [facility]  Plan  Patient Instructions  Continue on CPAP At bedtime   Work on healthy weight loss  Do not drive if sleepy  Follow up in 1 year with Dr. Neda or Coltan Spinello NP and As needed             Madelin Stank, NP 10/23/2023

## 2023-10-23 NOTE — Patient Instructions (Signed)
 Continue on CPAP At bedtime   Work on healthy weight loss  Do not drive if sleepy  Follow up in 1 year with Dr. Neda or Bronc Brosseau NP and As needed

## 2023-10-27 ENCOUNTER — Encounter (INDEPENDENT_AMBULATORY_CARE_PROVIDER_SITE_OTHER): Payer: Self-pay | Admitting: Family Medicine

## 2023-10-27 ENCOUNTER — Ambulatory Visit (INDEPENDENT_AMBULATORY_CARE_PROVIDER_SITE_OTHER): Admitting: Family Medicine

## 2023-10-27 VITALS — BP 109/74 | HR 77 | Temp 98.2°F | Ht 73.0 in | Wt 279.0 lb

## 2023-10-27 DIAGNOSIS — M25569 Pain in unspecified knee: Secondary | ICD-10-CM

## 2023-10-27 DIAGNOSIS — E669 Obesity, unspecified: Secondary | ICD-10-CM

## 2023-10-27 DIAGNOSIS — G8929 Other chronic pain: Secondary | ICD-10-CM

## 2023-10-27 DIAGNOSIS — E1165 Type 2 diabetes mellitus with hyperglycemia: Secondary | ICD-10-CM

## 2023-10-27 DIAGNOSIS — M545 Low back pain, unspecified: Secondary | ICD-10-CM | POA: Diagnosis not present

## 2023-10-27 DIAGNOSIS — G3289 Other specified degenerative disorders of nervous system in diseases classified elsewhere: Secondary | ICD-10-CM | POA: Diagnosis not present

## 2023-10-27 DIAGNOSIS — E559 Vitamin D deficiency, unspecified: Secondary | ICD-10-CM | POA: Diagnosis not present

## 2023-10-27 DIAGNOSIS — Z7985 Long-term (current) use of injectable non-insulin antidiabetic drugs: Secondary | ICD-10-CM

## 2023-10-27 DIAGNOSIS — Z6836 Body mass index (BMI) 36.0-36.9, adult: Secondary | ICD-10-CM

## 2023-10-27 DIAGNOSIS — E119 Type 2 diabetes mellitus without complications: Secondary | ICD-10-CM | POA: Diagnosis not present

## 2023-10-27 MED ORDER — VITAMIN D (ERGOCALCIFEROL) 1.25 MG (50000 UNIT) PO CAPS: 50000.0000 [IU] | ORAL_CAPSULE | ORAL | 0 refills | Status: DC

## 2023-10-27 MED ORDER — TIRZEPATIDE 10 MG/0.5ML ~~LOC~~ SOAJ: SUBCUTANEOUS | 0 refills | Status: DC

## 2023-10-27 NOTE — Progress Notes (Unsigned)
 Office: [phone]  /  Fax: [phone]  WEIGHT SUMMARY AND BIOMETRICS  Anthropometric Measurements Height: 6' 1 (1.854 m) Weight: 279 lb (126.6 kg) BMI (Calculated): 36.82 Weight at Last Visit: 280 lb Weight Lost Since Last Visit: 1 lb Weight Gained Since Last Visit: 0 Starting Weight: 313 lb Total Weight Loss (lbs): 34 lb (15.4 kg) Peak Weight: 400 lb   Body Composition  Body Fat %: 36.7 % Fat Mass (lbs): 102.4 lbs Muscle Mass (lbs): 168.2 lbs Total Body Water (lbs): 127.2 lbs Visceral Fat Rating : 21   Other Clinical Data Fasting: no Labs: no Today's Visit #: 28 Starting Date: 09/25/21 Comments: cat 3    Chief Complaint: OBESITY   Discussed the use of AI scribe software for clinical note transcription with the patient, who gave verbal consent to proceed.  History of Present Illness Antonio Horton is a 58 year old male with obesity and type 2 diabetes who presents for obesity treatment and progress assessment.  He is following the category three eating plan about fifty percent of the time, with inadequate consumption of fruits, vegetables, and protein. He hydrates well and tries not to skip meals. He gets at least seven hours of sleep per night but is not currently exercising. He lost one pound in the last month since his last visit.  He recently transitioned from Rybelsus  14 mg to Mounjaro  10 mg, which he started three days ago after resolving an insurance issue. He has no problems with the new medication but notes a lack of interest in food, which may affect his protein intake.  He is on prescription vitamin D  50,000 IU for vitamin D  deficiency and requests a refill. His last vitamin D  level was checked about ten months ago.  He experiences chronic back pain due to degenerative facet disease, which affects his daily activities. The pain is primarily in his back and worsens with prolonged sitting. Previous interventions such as injections and nerve  ablation did not provide relief. The pain is constant and affects his ability to exercise. He also experiences knee pain, which is exacerbated by weather changes.  He mentions that he does a lot of walking and needs to get up and move around frequently to alleviate discomfort from sitting. He used to work from home, which allowed more flexibility for movement, but now he works in an office setting.      PHYSICAL EXAM:  Blood pressure 109/74, pulse 77, temperature 98.2 F (36.8 C), height 6' 1 (1.854 m), weight 279 lb (126.6 kg), SpO2 97%. Body mass index is 36.81 kg/m.  DIAGNOSTIC DATA REVIEWED:  BMET    Component Value Date/Time   NA 142 01/06/2023 0847   K 4.5 01/06/2023 0847   CL 104 01/06/2023 0847   CO2 24 01/06/2023 0847   GLUCOSE 122 (H) 01/06/2023 0847   GLUCOSE 138 (H) 07/09/2022 1318   BUN 14 01/06/2023 0847   CREATININE 1.30 (H) 08/11/2023 1310   CALCIUM  9.4 01/06/2023 0847   GFRNONAA >60 07/09/2022 1318   GFRAA 95 12/08/2007 0958   Lab Results  Component Value Date   HGBA1C 5.9 (A) 10/13/2023   HGBA1C 6.2 (H) 12/15/2007   Lab Results  Component Value Date   INSULIN  39.7 (H) 01/06/2023   INSULIN  41.9 (H) 09/25/2021   Lab Results  Component Value Date   TSH 1.350 09/25/2021   CBC    Component Value Date/Time   WBC 9.9 07/09/2022 1318   RBC 5.43 07/09/2022 1318  HGB 16.2 07/09/2022 1318   HGB 15.9 09/25/2021 1010   HCT 47.4 07/09/2022 1318   HCT 46.5 09/25/2021 1010   PLT 269 07/09/2022 1318   MCV 87.3 07/09/2022 1318   MCV 91 09/25/2021 1010   MCH 29.8 07/09/2022 1318   MCHC 34.2 07/09/2022 1318   RDW 13.2 07/09/2022 1318   RDW 13.1 09/25/2021 1010   Iron Studies    Component Value Date/Time   IRON 94 04/12/2021 1115   FERRITIN 278.7 04/12/2021 1115   Lipid Panel     Component Value Date/Time   CHOL 108 01/06/2023 0847   TRIG 106 01/06/2023 0847   HDL 37 (L) 01/06/2023 0847   CHOLHDL 3 03/13/2021 0906   VLDL 25.8 03/13/2021 0906    LDLCALC 51 01/06/2023 0847   LDLCALC 117 (H) 03/25/2019 1541   Hepatic Function Panel     Component Value Date/Time   PROT 6.6 01/06/2023 0847   ALBUMIN 4.2 01/06/2023 0847   AST 18 01/06/2023 0847   ALT 19 01/06/2023 0847   ALKPHOS 52 01/06/2023 0847   BILITOT 0.6 01/06/2023 0847   BILIDIR 0.2 03/25/2021 0903      Component Value Date/Time   TSH 1.350 09/25/2021 1010   Nutritional Lab Results  Component Value Date   VD25OH 38.4 01/06/2023   VD25OH 41.5 02/27/2022   VD25OH 19.9 (L) 09/25/2021     Assessment and Plan Assessment & Plan Obesity Obesity management is ongoing with a category three eating plan, followed 50% of the time. Inadequate intake of fruits, vegetables, and protein. Hydration is adequate, and he is getting at least seven hours of sleep per night. No current exercise regimen. Weight loss of one pound in the last month. Mounjaro  10 mg has been started recently after transitioning from Rybelsus  14 mg. No adverse effects reported from Mounjaro , but there is a noted decrease in interest in food, which may affect protein intake. It takes four full doses of Mounjaro  to assess its full effect. - Track protein intake and aim for 120 grams per day. - Track calorie intake, ensuring it does not drop below 1500 calories per day. - Consider using protein shakes to meet protein goals. - Add healthy fats to diet if calorie intake is too low. - Encourage use of microwave meals for convenience. - Refill Mounjaro  prescription and assess need for dose adjustment after four full doses.  Type 2 diabetes mellitus Type 2 diabetes is being managed with Mounjaro , recently increased to 10 mg. A1c was checked two weeks ago and is at 5.9, indicating good glycemic control. - Continue Mounjaro  10 mg as prescribed. - Continue diet, exercise and weight loss as discussed today as an important part of the treatment plan   Vitamin D  deficiency Vitamin D  deficiency is being managed with  prescription vitamin D  50,000 IU. Last vitamin D  level was checked ten months ago. - Refill vitamin D  50,000 IU prescription. - Order vitamin D  level test.  Chronic low back pain due to degenerative facet disease Chronic low back pain is attributed to degenerative facet disease. Pain is persistent and affects daily activities. Previous interventions include injections and nerve ablation, which were ineffective. Pain is exacerbated by prolonged sitting and alleviated by walking. - Consider using a wobble cushion or yoga ball to improve posture and reduce pain. - Encourage regular movement and stretching to prevent fluid buildup in the spine. - Continue diet, exercise and weight loss as discussed today as an important part of the treatment plan  Chronic knee pain Chronic knee pain is present, worsened by weather changes, making exercise painful - Will continue working on diet and exercise to take pressure off the joint for pain reduction   Follow-Up Follow-up appointment scheduled for four weeks from today. Vitamin D  level test to be conducted before the next appointment if possible. - Schedule follow-up appointment in four weeks. - Conduct vitamin D  level test before next appointment if possible.     Jolon was informed of the importance of frequent follow up visits to maximize his success with intensive lifestyle modifications for his obesity and obesity related health conditions as recommended by USPSTF and CMS guidelines   Louann Penton, MD

## 2023-11-17 ENCOUNTER — Encounter (INDEPENDENT_AMBULATORY_CARE_PROVIDER_SITE_OTHER): Payer: Self-pay | Admitting: Family Medicine

## 2023-11-17 DIAGNOSIS — E1165 Type 2 diabetes mellitus with hyperglycemia: Secondary | ICD-10-CM

## 2023-11-18 NOTE — Telephone Encounter (Signed)
 Ok to rf x 1

## 2023-11-19 LAB — VITAMIN D 25 HYDROXY (VIT D DEFICIENCY, FRACTURES): Vit D, 25-Hydroxy: 26.7 ng/mL — ABNORMAL LOW (ref 30.0–100.0)

## 2023-11-19 MED ORDER — TIRZEPATIDE 10 MG/0.5ML ~~LOC~~ SOAJ
SUBCUTANEOUS | 0 refills | Status: DC
Start: 2023-11-19 — End: 2023-11-24

## 2023-11-24 ENCOUNTER — Ambulatory Visit (INDEPENDENT_AMBULATORY_CARE_PROVIDER_SITE_OTHER): Payer: Self-pay | Admitting: Family Medicine

## 2023-11-24 ENCOUNTER — Encounter (INDEPENDENT_AMBULATORY_CARE_PROVIDER_SITE_OTHER): Payer: Self-pay | Admitting: Family Medicine

## 2023-11-24 VITALS — BP 98/66 | HR 67 | Temp 97.8°F | Ht 73.0 in | Wt 271.0 lb

## 2023-11-24 DIAGNOSIS — R110 Nausea: Secondary | ICD-10-CM | POA: Diagnosis not present

## 2023-11-24 DIAGNOSIS — I959 Hypotension, unspecified: Secondary | ICD-10-CM

## 2023-11-24 DIAGNOSIS — E1165 Type 2 diabetes mellitus with hyperglycemia: Secondary | ICD-10-CM

## 2023-11-24 DIAGNOSIS — Z7985 Long-term (current) use of injectable non-insulin antidiabetic drugs: Secondary | ICD-10-CM

## 2023-11-24 DIAGNOSIS — R140 Abdominal distension (gaseous): Secondary | ICD-10-CM | POA: Diagnosis not present

## 2023-11-24 DIAGNOSIS — Z6835 Body mass index (BMI) 35.0-35.9, adult: Secondary | ICD-10-CM

## 2023-11-24 DIAGNOSIS — Z7984 Long term (current) use of oral hypoglycemic drugs: Secondary | ICD-10-CM

## 2023-11-24 DIAGNOSIS — E119 Type 2 diabetes mellitus without complications: Secondary | ICD-10-CM | POA: Diagnosis not present

## 2023-11-24 DIAGNOSIS — R143 Flatulence: Secondary | ICD-10-CM

## 2023-11-24 DIAGNOSIS — R198 Other specified symptoms and signs involving the digestive system and abdomen: Secondary | ICD-10-CM | POA: Insufficient documentation

## 2023-11-24 MED ORDER — TIRZEPATIDE 10 MG/0.5ML ~~LOC~~ SOAJ: SUBCUTANEOUS | 0 refills | Status: DC

## 2023-11-24 NOTE — Progress Notes (Signed)
 Office: [phone]  /  Fax: [phone]  WEIGHT SUMMARY AND BIOMETRICS  Anthropometric Measurements Height: 6' 1 (1.854 m) Weight: 271 lb (122.9 kg) BMI (Calculated): 35.76 Weight at Last Visit: 279 lb Weight Lost Since Last Visit: 8 lb Weight Gained Since Last Visit: 0 Starting Weight: 313 lb Total Weight Loss (lbs): 42 lb (19.1 kg) Peak Weight: 400 lb   Body Composition  Body Fat %: 35.3 % Fat Mass (lbs): 95.8 lbs Muscle Mass (lbs): 166.8 lbs Total Body Water (lbs): 123.6 lbs Visceral Fat Rating : 20   Other Clinical Data Fasting: no Labs: no Today's Visit #: 29 Starting Date: 09/25/21 Comments: cat 3    Chief Complaint: OBESITY   History of Present Illness Antonio Horton is a 58 year old male with obesity and type 2 diabetes who presents for obesity treatment and progress assessment.  He has been following a category three eating plan about sixty percent of the time, focusing on increasing vegetable intake and hydration. However, he struggles with meeting his recommended protein intake, often skips meals, and does not consistently achieve seven to nine hours of sleep per night. Over the past month, he has lost eight pounds since his last visit.  In managing his type 2 diabetes, he is on metformin  and Mounjaro . His appetite is well-controlled, describing his hunger as 'nonexistent'. He experiences occasional gastrointestinal issues, such as a sour stomach and bloating, which he attributes to gas. These symptoms are relieved by belching or passing gas. Today is the first day he has experienced significant nausea and bloating since starting his current treatment regimen.  His energy levels are generally stable, although he feels a bit 'wobbly' today, which he attributes to not feeling well and having low blood pressure, recorded at 98/66. He is attempting to consume more protein and eat four meals a day to avoid feeling starved. He has found some high-protein  shakes that he consumes, especially on days when he does not eat much. He mentions that he has not been eating much fruit but has been consuming vegetables, primarily beans, for fiber intake.  No sharp or stabbing abdominal pain. Bloating is relieved by belching or passing gas. No lightheadedness or dizziness, except for a little wobbliness today, which he attributes to not feeling well.      PHYSICAL EXAM:  Blood pressure 98/66, pulse 67, temperature 97.8 F (36.6 C), height 6' 1 (1.854 m), weight 271 lb (122.9 kg), SpO2 97%. Body mass index is 35.75 kg/m.  DIAGNOSTIC DATA REVIEWED:  BMET    Component Value Date/Time   NA 142 01/06/2023 0847   K 4.5 01/06/2023 0847   CL 104 01/06/2023 0847   CO2 24 01/06/2023 0847   GLUCOSE 122 (H) 01/06/2023 0847   GLUCOSE 138 (H) 07/09/2022 1318   BUN 14 01/06/2023 0847   CREATININE 1.30 (H) 08/11/2023 1310   CALCIUM  9.4 01/06/2023 0847   GFRNONAA >60 07/09/2022 1318   GFRAA 95 12/08/2007 0958   Lab Results  Component Value Date   HGBA1C 5.9 (A) 10/13/2023   HGBA1C 6.2 (H) 12/15/2007   Lab Results  Component Value Date   INSULIN  39.7 (H) 01/06/2023   INSULIN  41.9 (H) 09/25/2021   Lab Results  Component Value Date   TSH 1.350 09/25/2021   CBC    Component Value Date/Time   WBC 9.9 07/09/2022 1318   RBC 5.43 07/09/2022 1318   HGB 16.2 07/09/2022 1318   HGB 15.9 09/25/2021 1010   HCT 47.4 07/09/2022  1318   HCT 46.5 09/25/2021 1010   PLT 269 07/09/2022 1318   MCV 87.3 07/09/2022 1318   MCV 91 09/25/2021 1010   MCH 29.8 07/09/2022 1318   MCHC 34.2 07/09/2022 1318   RDW 13.2 07/09/2022 1318   RDW 13.1 09/25/2021 1010   Iron Studies    Component Value Date/Time   IRON 94 04/12/2021 1115   FERRITIN 278.7 04/12/2021 1115   Lipid Panel     Component Value Date/Time   CHOL 108 01/06/2023 0847   TRIG 106 01/06/2023 0847   HDL 37 (L) 01/06/2023 0847   CHOLHDL 3 03/13/2021 0906   VLDL 25.8 03/13/2021 0906   LDLCALC 51  01/06/2023 0847   LDLCALC 117 (H) 03/25/2019 1541   Hepatic Function Panel     Component Value Date/Time   PROT 6.6 01/06/2023 0847   ALBUMIN 4.2 01/06/2023 0847   AST 18 01/06/2023 0847   ALT 19 01/06/2023 0847   ALKPHOS 52 01/06/2023 0847   BILITOT 0.6 01/06/2023 0847   BILIDIR 0.2 03/25/2021 0903      Component Value Date/Time   TSH 1.350 09/25/2021 1010   Nutritional Lab Results  Component Value Date   VD25OH 26.7 (L) 11/18/2023   VD25OH 38.4 01/06/2023   VD25OH 41.5 02/27/2022     Assessment and Plan Assessment & Plan Morbid obesity due to excess calories Morbid obesity managed with a category three eating plan, followed 60% of the time. He has lost 8 pounds in the last month, with 5 pounds being fat and the rest water. Visceral fat has decreased. Challenges include meal skipping and inadequate sleep. Appetite is well-controlled with current medication regimen. Protein intake is a focus, with efforts to increase consumption. He is using protein shakes to supplement diet. - Continue category three eating plan - Encouraged adequate protein intake - Advised on meal planning to avoid skipping meals - Scheduled follow-up appointment for January 12th  Type 2 diabetes mellitus  Type 2 diabetes managed with metformin  and Mounjaro . He is working on diet, exercise, and weight loss. Current medication regimen appears effective, with no indication of needing adjustment. - Continue metformin  and Mounjaro  - Encouraged adherence to diet and exercise regimen  Mild acute gastrointestinal symptoms (bloating, nausea, gas) Mild gastrointestinal symptoms including bloating, nausea, and gas, possibly related to dietary choices or medication side effects, but this doesn't appear to be the cause as of now. Symptoms are not severe and resolve with belching or passing gas. No indication of gastroparesis at this time. - Monitor symptoms and report if he persists or worsens - Ensure adequate  hydration  Transient hypotension Blood pressure recorded at 98/66, lower than usual. Symptoms include feeling wobbly and lightheaded, likely related to low food intake and dehydration. Symptoms improved with hydration. - Encouraged adequate hydration - Instructed to monitor blood pressure and symptoms     Akshath was counseled on the importance of maintaining healthy lifestyle habits, including balanced nutrition, regular physical activity, and behavioral modifications, while taking antiobesity medication.  Patient verbalized understanding that medication is an adjunct to, not a replacement for, lifestyle changes and that the long-term success and weight maintenance depend on continued adherence to these strategies.   Doug was informed of the importance of frequent follow up visits to maximize his success with intensive lifestyle modifications for his obesity and obesity related health conditions as recommended by USPSTF and CMS guidelines   Louann Penton, MD

## 2023-11-30 ENCOUNTER — Other Ambulatory Visit: Payer: Self-pay | Admitting: Primary Care

## 2023-11-30 DIAGNOSIS — E119 Type 2 diabetes mellitus without complications: Secondary | ICD-10-CM

## 2023-12-22 ENCOUNTER — Encounter (INDEPENDENT_AMBULATORY_CARE_PROVIDER_SITE_OTHER): Payer: Self-pay | Admitting: Family Medicine

## 2023-12-22 ENCOUNTER — Ambulatory Visit (INDEPENDENT_AMBULATORY_CARE_PROVIDER_SITE_OTHER): Payer: Self-pay | Admitting: Family Medicine

## 2023-12-22 VITALS — BP 121/80 | HR 68 | Temp 97.7°F | Ht 73.0 in | Wt 274.0 lb

## 2023-12-22 DIAGNOSIS — E559 Vitamin D deficiency, unspecified: Secondary | ICD-10-CM | POA: Diagnosis not present

## 2023-12-22 DIAGNOSIS — E1165 Type 2 diabetes mellitus with hyperglycemia: Secondary | ICD-10-CM

## 2023-12-22 DIAGNOSIS — Z7985 Long-term (current) use of injectable non-insulin antidiabetic drugs: Secondary | ICD-10-CM

## 2023-12-22 DIAGNOSIS — Z6836 Body mass index (BMI) 36.0-36.9, adult: Secondary | ICD-10-CM

## 2023-12-22 DIAGNOSIS — F439 Reaction to severe stress, unspecified: Secondary | ICD-10-CM | POA: Diagnosis not present

## 2023-12-22 DIAGNOSIS — Z7984 Long term (current) use of oral hypoglycemic drugs: Secondary | ICD-10-CM

## 2023-12-22 MED ORDER — TIRZEPATIDE 12.5 MG/0.5ML ~~LOC~~ SOAJ: 12.5000 mg | SUBCUTANEOUS | 0 refills | Status: DC

## 2023-12-22 MED ORDER — VITAMIN D (ERGOCALCIFEROL) 1.25 MG (50000 UNIT) PO CAPS: 50000.0000 [IU] | ORAL_CAPSULE | ORAL | 0 refills | Status: DC

## 2023-12-22 NOTE — Progress Notes (Signed)
 Office: [phone]  /  Fax: [phone]  WEIGHT SUMMARY AND BIOMETRICS  Anthropometric Measurements Height: 6' 1 (1.854 m) Weight: 274 lb (124.3 kg) BMI (Calculated): 36.16 Weight at Last Visit: 21 lb Weight Lost Since Last Visit: 0 Weight Gained Since Last Visit: 3 lb Starting Weight: 313 lb Total Weight Loss (lbs): 39 lb (17.7 kg) Peak Weight: 400 lb   Body Composition  Body Fat %: 36.8 % Fat Mass (lbs): 100.8 lbs Muscle Mass (lbs): 164.8 lbs Total Body Water (lbs): 125 lbs Visceral Fat Rating : 21   Other Clinical Data Fasting: no Labs: no Today's Visit #: 30 Starting Date: 09/25/21 Comments: cat 3    Chief Complaint: OBESITY    History of Present Illness Antonio Horton is a 58 year old male with obesity and type 2 diabetes who presents for obesity treatment and progress assessment.  He follows a category three eating plan, adhering to it about sixty percent of the time. He attempts to increase vegetable intake and maintain hydration but occasionally skips meals and does not consistently achieve seven to nine hours of sleep per night. He has not engaged in exercise over the past month and has gained three pounds, partly attributing this to Thanksgiving. He expresses a desire to maintain his current weight without making drastic changes over Christmas.  His type 2 diabetes is managed with metformin  XR 500 mg, two pills daily, and Mounjaro  at 10 mg per week. His most recent hemoglobin A1c was 5.9. He has been on the current Mounjaro  dose for approximately nine weeks after switching from Rybelsus  and reports no issues with this dose. He has not experienced nausea, a side effect his wife encounters with her medication.  He is treated for vitamin D  deficiency, with a recent level of 26.7. He takes 50,000 international units weekly.  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 He works at an [employer]management [employer] and anticipates potential job loss due to the acquisition, having been with the company for 28 years. He expresses uncertainty about future employment opportunities.  Cold, wet weather exacerbates his neck and back pain, making physical activity difficult. He has considered chair yoga as a potential exercise option but associates it with older adults. His back pain has worsened with the colder weather.      PHYSICAL EXAM:  Blood pressure 121/80, pulse 68, temperature 97.7 F (36.5 C), height 6' 1 (1.854 m), weight 274 lb (124.3 kg), SpO2 97%. Body mass index is 36.15 kg/m.  DIAGNOSTIC DATA REVIEWED:  BMET    Component Value Date/Time   NA 142 01/06/2023 0847   K 4.5 01/06/2023 0847   CL 104 01/06/2023 0847   CO2 24 01/06/2023 0847   GLUCOSE 122 (H) 01/06/2023 0847   GLUCOSE 138 (H) 07/09/2022 1318   BUN 14 01/06/2023 0847   CREATININE 1.30 (H) 08/11/2023 1310   CALCIUM  9.4 01/06/2023 0847   GFRNONAA >60 07/09/2022 1318   GFRAA 95 12/08/2007 0958   Lab Results  Component Value Date   HGBA1C 5.9 (A) 10/13/2023   HGBA1C 6.2 (H) 12/15/2007   Lab Results  Component Value Date   INSULIN  39.7 (H) 01/06/2023   INSULIN  41.9 (H) 09/25/2021   Lab Results  Component Value Date   TSH 1.350 09/25/2021   CBC    Component Value Date/Time   WBC 9.9 07/09/2022 1318  RBC 5.43 07/09/2022 1318   HGB 16.2 07/09/2022 1318   HGB 15.9 09/25/2021 1010   HCT 47.4 07/09/2022 1318   HCT 46.5 09/25/2021 1010   PLT 269 07/09/2022 1318   MCV 87.3 07/09/2022 1318   MCV 91 09/25/2021 1010   MCH 29.8 07/09/2022 1318   MCHC 34.2 07/09/2022 1318   RDW 13.2 07/09/2022 1318   RDW 13.1 09/25/2021 1010   Iron Studies    Component Value Date/Time   IRON 94 04/12/2021 1115   FERRITIN 278.7 04/12/2021 1115   Lipid Panel     Component Value Date/Time   CHOL 108 01/06/2023 0847   TRIG 106 01/06/2023 0847   HDL 37  (L) 01/06/2023 0847   CHOLHDL 3 03/13/2021 0906   VLDL 25.8 03/13/2021 0906   LDLCALC 51 01/06/2023 0847   LDLCALC 117 (H) 03/25/2019 1541   Hepatic Function Panel     Component Value Date/Time   PROT 6.6 01/06/2023 0847   ALBUMIN 4.2 01/06/2023 0847   AST 18 01/06/2023 0847   ALT 19 01/06/2023 0847   ALKPHOS 52 01/06/2023 0847   BILITOT 0.6 01/06/2023 0847   BILIDIR 0.2 03/25/2021 0903      Component Value Date/Time   TSH 1.350 09/25/2021 1010   Nutritional Lab Results  Component Value Date   VD25OH 26.7 (L) 11/18/2023   VD25OH 38.4 01/06/2023   VD25OH 41.5 02/27/2022     Assessment and Plan Assessment & Plan Morbid obesity due to excess calories Weight gain of 3 pounds over the last month, attributed to stress-induced poor dietary choices and lack of exercise. Current adherence to the category three eating plan is 60%. No exercise reported in the last month. Stress from company sale may be contributing to weight gain. - Increased Mounjaro  dose to 12.5 mg per week. - Encouraged adherence to the category three eating plan. - Advised on stress management techniques to prevent emotional eating. - Recommended chair yoga for stress relief and muscle stretching and strengthening.  Type 2 diabetes mellitus with hyperglycemia Type 2 diabetes is well-controlled with a recent hemoglobin A1c of 5.9. Currently on metformin  XR 500 mg, two pills a day, and Mounjaro  10 mg per week. No reported side effects from Mounjaro . Discussed potential nausea with Mounjaro  and strategies to manage it, such as eating dry carbohydrates like saltine crackers. - Increased Mounjaro  dose to 12.5 mg per week. - Continue metformin  XR 500 mg, two pills a day. - Monitor for any side effects from Mounjaro , such as nausea.  Vitamin D  deficiency Recent vitamin D  level of 26.7. Currently on 50,000 IU of vitamin D  weekly. - Continue vitamin D  50,000 IU weekly.  Stress Increased stress due to company sale,  potentially contributing to weight gain and poor dietary choices. Discussed the impact of stress on cortisol levels and hunger signals. - Encouraged stress management techniques, including exercise and dietary awareness. - Offered support for mood-related issues, including potential medications with weight loss side effects if needed.      Patients who are on anti-obesity medications are counseled on the importance of maintaining healthy lifestyle habits, including balanced nutrition, regular physical activity, and behavioral modifications,  Medication is an adjunct to, not a replacement for, lifestyle changes and that the long-term success and weight maintenance depend on continued adherence to these strategies.   Antonio Horton was informed of the importance of frequent follow up visits to maximize his success with intensive lifestyle modifications for his obesity and obesity related health conditions as recommended  by USPSTF and CMS guidelines   Louann Penton, MD

## 2023-12-31 ENCOUNTER — Other Ambulatory Visit: Payer: Self-pay | Admitting: Thoracic Surgery (Cardiothoracic Vascular Surgery)

## 2023-12-31 DIAGNOSIS — I7121 Aneurysm of the ascending aorta, without rupture: Secondary | ICD-10-CM

## 2024-02-01 ENCOUNTER — Ambulatory Visit (INDEPENDENT_AMBULATORY_CARE_PROVIDER_SITE_OTHER): Admitting: Family Medicine

## 2024-02-01 ENCOUNTER — Encounter (INDEPENDENT_AMBULATORY_CARE_PROVIDER_SITE_OTHER): Payer: Self-pay | Admitting: Family Medicine

## 2024-02-01 VITALS — BP 104/68 | HR 68 | Temp 97.6°F | Ht 73.0 in | Wt 268.0 lb

## 2024-02-01 DIAGNOSIS — E559 Vitamin D deficiency, unspecified: Secondary | ICD-10-CM

## 2024-02-01 DIAGNOSIS — E119 Type 2 diabetes mellitus without complications: Secondary | ICD-10-CM | POA: Diagnosis not present

## 2024-02-01 DIAGNOSIS — Z7984 Long term (current) use of oral hypoglycemic drugs: Secondary | ICD-10-CM | POA: Diagnosis not present

## 2024-02-01 DIAGNOSIS — E669 Obesity, unspecified: Secondary | ICD-10-CM

## 2024-02-01 DIAGNOSIS — Z7985 Long-term (current) use of injectable non-insulin antidiabetic drugs: Secondary | ICD-10-CM

## 2024-02-01 DIAGNOSIS — E1165 Type 2 diabetes mellitus with hyperglycemia: Secondary | ICD-10-CM

## 2024-02-01 DIAGNOSIS — Z6835 Body mass index (BMI) 35.0-35.9, adult: Secondary | ICD-10-CM | POA: Diagnosis not present

## 2024-02-01 MED ORDER — VITAMIN D (ERGOCALCIFEROL) 1.25 MG (50000 UNIT) PO CAPS: 50000.0000 [IU] | ORAL_CAPSULE | ORAL | 0 refills | Status: AC

## 2024-02-01 MED ORDER — TIRZEPATIDE 10 MG/0.5ML ~~LOC~~ SOAJ: 10.0000 mg | SUBCUTANEOUS | 0 refills | Status: AC

## 2024-02-01 NOTE — Progress Notes (Unsigned)
 "  Office: [phone]  /  Fax: [phone]  WEIGHT SUMMARY AND BIOMETRICS  Anthropometric Measurements Height: 6' 1 (1.854 m) Weight: 268 lb (121.6 kg) BMI (Calculated): 35.37 Weight at Last Visit: 274 lb Weight Lost Since Last Visit: 6 lb Weight Gained Since Last Visit: 0 Starting Weight: 313 lb Total Weight Loss (lbs): 45 lb (20.4 kg) Peak Weight: 400 lb   Body Composition  Body Fat %: 36.3 % Fat Mass (lbs): 97.4 lbs Muscle Mass (lbs): 162.4 lbs Total Body Water (lbs): 120.4 lbs Visceral Fat Rating : 20   Other Clinical Data Fasting: yes Labs: no Today's Visit #: 31 Starting Date: 09/25/21 Comments: cat 3    Chief Complaint: OBESITY    History of Present Illness Antonio Horton is a 59 year old male with obesity and type two diabetes who presents for obesity treatment and progress assessment.  He is following the category three eating plan but has only adhered to it about 20% of the time over the last month. He struggles to consume the recommended amount of protein and sufficient fruits and vegetables. He is attempting to stay hydrated but often skips meals and is not currently exercising. Despite these challenges, he has lost six pounds in the last month.  He is being treated for type two diabetes with metformin  XR 500 mg, two pills daily, and Mounjaro  12.5 mg once a week. His Mounjaro  dose was increased from 10 mg to 12.5 mg last month. Since the dose increase, he experiences nausea, bloating, gas, hot and cold sensations, headaches, and exhaustion. These symptoms typically begin 24 to 48 hours after taking the medication and last for about four days, easing off by Thursday.  He is also on prescription ergocalciferol  for vitamin D  deficiency and requests a refill. His primary care provider has been managing his metformin  prescriptions well, and he has an excess supply of the medication.  His wife does most of the cooking at home. He both struggle with meal  planning and often resort to quick meals like chicken, which his wife has grown to dislike. His work has been accommodating by allowing him to work from home a few days a week due to his symptoms.      PHYSICAL EXAM:  Blood pressure 104/68, pulse 68, temperature 97.6 F (36.4 C), height 6' 1 (1.854 m), weight 268 lb (121.6 kg), SpO2 93%. Body mass index is 35.36 kg/m.  DIAGNOSTIC DATA REVIEWED:  BMET    Component Value Date/Time   NA 142 01/06/2023 0847   K 4.5 01/06/2023 0847   CL 104 01/06/2023 0847   CO2 24 01/06/2023 0847   GLUCOSE 122 (H) 01/06/2023 0847   GLUCOSE 138 (H) 07/09/2022 1318   BUN 14 01/06/2023 0847   CREATININE 1.30 (H) 08/11/2023 1310   CALCIUM  9.4 01/06/2023 0847   GFRNONAA >60 07/09/2022 1318   GFRAA 95 12/08/2007 0958   Lab Results  Component Value Date   HGBA1C 5.9 (A) 10/13/2023   HGBA1C 6.2 (H) 12/15/2007   Lab Results  Component Value Date   INSULIN  39.7 (H) 01/06/2023   INSULIN  41.9 (H) 09/25/2021   Lab Results  Component Value Date   TSH 1.350 09/25/2021   CBC    Component Value Date/Time   WBC 9.9 07/09/2022 1318   RBC 5.43 07/09/2022 1318   HGB 16.2 07/09/2022 1318   HGB 15.9 09/25/2021 1010   HCT 47.4 07/09/2022 1318   HCT 46.5 09/25/2021 1010   PLT 269 07/09/2022 1318  MCV 87.3 07/09/2022 1318   MCV 91 09/25/2021 1010   MCH 29.8 07/09/2022 1318   MCHC 34.2 07/09/2022 1318   RDW 13.2 07/09/2022 1318   RDW 13.1 09/25/2021 1010   Iron Studies    Component Value Date/Time   IRON 94 04/12/2021 1115   FERRITIN 278.7 04/12/2021 1115   Lipid Panel     Component Value Date/Time   CHOL 108 01/06/2023 0847   TRIG 106 01/06/2023 0847   HDL 37 (L) 01/06/2023 0847   CHOLHDL 3 03/13/2021 0906   VLDL 25.8 03/13/2021 0906   LDLCALC 51 01/06/2023 0847   LDLCALC 117 (H) 03/25/2019 1541   Hepatic Function Panel     Component Value Date/Time   PROT 6.6 01/06/2023 0847   ALBUMIN 4.2 01/06/2023 0847   AST 18 01/06/2023 0847    ALT 19 01/06/2023 0847   ALKPHOS 52 01/06/2023 0847   BILITOT 0.6 01/06/2023 0847   BILIDIR 0.2 03/25/2021 0903      Component Value Date/Time   TSH 1.350 09/25/2021 1010   Nutritional Lab Results  Component Value Date   VD25OH 26.7 (L) 11/18/2023   VD25OH 38.4 01/06/2023   VD25OH 41.5 02/27/2022     Assessment and Plan Assessment & Plan Obesity Management is ongoing with a focus on dietary adherence and weight loss. He has lost six pounds in the last month but struggles with adherence to the category three eating plan, following it only 20% of the time. He is not exercising and is skipping meals, which may contribute to metabolic issues. The current Mounjaro  dose of 12.5 mg is causing significant side effects, including nausea, bloating, gas, and exhaustion, indicating the dose may be too high. - Reduced Mounjaro  dose to 10 mg for one month to assess tolerance and side effects. - Provided a 90-day prescription for Mounjaro  10 mg. - Encouraged adherence to the category three eating plan with a focus on adequate protein intake and hydration. - Discussed meal prepping strategies to ensure nutritional adequacy and ease of preparation. High Protein Freezer meal prep plan and grocery list given today. - Advised on the importance of regular meals to prevent exacerbation of nausea and metabolic fluctuations.  Type 2 diabetes mellitus Managed with metformin  XR 500 mg, two pills daily, and Mounjaro . The recent increase in Mounjaro  dose has led to side effects that may affect blood sugar control due to reduced food intake. He has not been monitoring blood sugars recently, but fluctuations are expected due to the side effects. - Continue metformin  XR 500 mg, two pills daily. - Monitor blood sugar levels if symptoms persist or worsen. - Discussed potential for blood sugar fluctuations due to reduced food intake and side effects of Mounjaro . - Decrease Mounjaro  to 10 mg weekly and follow up in  1 month. - Continue diet, exercise and weight loss as discussed today as an important part of the treatment plan  Vitamin D  deficiency Managed with prescription ergocalciferol . He requests a refill today. - Refilled prescription ergocalciferol .      Patients who are on anti-obesity medications are counseled on the importance of maintaining healthy lifestyle habits, including balanced nutrition, regular physical activity, and behavioral modifications,  Medication is an adjunct to, not a replacement for, lifestyle changes and that the long-term success and weight maintenance depend on continued adherence to these strategies.   Sohan was informed of the importance of frequent follow up visits to maximize his success with intensive lifestyle modifications for his obesity and obesity related  health conditions as recommended by USPSTF and CMS guidelines  Louann Penton, MD   "

## 2024-02-23 ENCOUNTER — Ambulatory Visit: Admitting: Thoracic Surgery (Cardiothoracic Vascular Surgery)

## 2024-02-29 ENCOUNTER — Ambulatory Visit (INDEPENDENT_AMBULATORY_CARE_PROVIDER_SITE_OTHER): Admitting: Family Medicine

## 2024-03-28 ENCOUNTER — Ambulatory Visit (INDEPENDENT_AMBULATORY_CARE_PROVIDER_SITE_OTHER): Admitting: Family Medicine

## 2024-04-13 ENCOUNTER — Encounter: Admitting: Primary Care
